# Patient Record
Sex: Male | Born: 1950 | Race: Black or African American | Hispanic: No | Marital: Married | Smoking: Former smoker
Health system: Southern US, Community
[De-identification: ages and names within clinical notes are randomized; demographics above are authoritative.]

## PROBLEM LIST (undated history)

## (undated) DIAGNOSIS — I10 Essential (primary) hypertension: Secondary | ICD-10-CM

## (undated) DIAGNOSIS — E785 Hyperlipidemia, unspecified: Secondary | ICD-10-CM

## (undated) DIAGNOSIS — G20 Parkinson's disease: Secondary | ICD-10-CM

## (undated) DIAGNOSIS — G20A1 Parkinson's disease without dyskinesia, without mention of fluctuations: Secondary | ICD-10-CM

## (undated) DIAGNOSIS — S301XXA Contusion of abdominal wall, initial encounter: Secondary | ICD-10-CM

## (undated) DIAGNOSIS — H269 Unspecified cataract: Secondary | ICD-10-CM

## (undated) DIAGNOSIS — IMO0002 Reserved for concepts with insufficient information to code with codable children: Secondary | ICD-10-CM

## (undated) DIAGNOSIS — M1990 Unspecified osteoarthritis, unspecified site: Secondary | ICD-10-CM

## (undated) DIAGNOSIS — C61 Malignant neoplasm of prostate: Secondary | ICD-10-CM

## (undated) HISTORY — DX: Essential (primary) hypertension: I10

## (undated) HISTORY — PX: POLYPECTOMY: SHX149

## (undated) HISTORY — DX: Malignant neoplasm of prostate: C61

## (undated) HISTORY — DX: Parkinson's disease: G20

## (undated) HISTORY — DX: Hyperlipidemia, unspecified: E78.5

## (undated) HISTORY — DX: Unspecified cataract: H26.9

## (undated) HISTORY — DX: Unspecified osteoarthritis, unspecified site: M19.90

## (undated) HISTORY — DX: Contusion of abdominal wall, initial encounter: S30.1XXA

## (undated) HISTORY — DX: Reserved for concepts with insufficient information to code with codable children: IMO0002

## (undated) HISTORY — PX: COLONOSCOPY: SHX174

## (undated) HISTORY — DX: Parkinson's disease without dyskinesia, without mention of fluctuations: G20.A1

---

## 1970-07-06 DIAGNOSIS — IMO0002 Reserved for concepts with insufficient information to code with codable children: Secondary | ICD-10-CM

## 1970-07-06 HISTORY — DX: Reserved for concepts with insufficient information to code with codable children: IMO0002

## 1972-07-06 HISTORY — PX: FACIAL RECONSTRUCTION SURGERY: SHX631

## 1997-10-04 ENCOUNTER — Encounter: Admission: RE | Admit: 1997-10-04 | Discharge: 1997-10-04 | Payer: Self-pay | Admitting: Family Medicine

## 1997-10-12 ENCOUNTER — Encounter: Admission: RE | Admit: 1997-10-12 | Discharge: 1997-10-12 | Payer: Self-pay | Admitting: Family Medicine

## 1998-04-18 ENCOUNTER — Encounter: Admission: RE | Admit: 1998-04-18 | Discharge: 1998-04-18 | Payer: Self-pay | Admitting: Family Medicine

## 1998-06-06 ENCOUNTER — Encounter: Admission: RE | Admit: 1998-06-06 | Discharge: 1998-06-06 | Payer: Self-pay | Admitting: Family Medicine

## 1998-07-16 ENCOUNTER — Encounter: Admission: RE | Admit: 1998-07-16 | Discharge: 1998-07-16 | Payer: Self-pay | Admitting: Family Medicine

## 1998-07-19 ENCOUNTER — Encounter: Admission: RE | Admit: 1998-07-19 | Discharge: 1998-10-17 | Payer: Self-pay | Admitting: *Deleted

## 1998-08-05 ENCOUNTER — Ambulatory Visit (HOSPITAL_COMMUNITY): Admission: RE | Admit: 1998-08-05 | Discharge: 1998-08-05 | Payer: Self-pay | Admitting: *Deleted

## 1998-08-16 ENCOUNTER — Encounter: Admission: RE | Admit: 1998-08-16 | Discharge: 1998-08-16 | Payer: Self-pay | Admitting: Family Medicine

## 1998-10-14 ENCOUNTER — Encounter: Admission: RE | Admit: 1998-10-14 | Discharge: 1998-10-14 | Payer: Self-pay | Admitting: Family Medicine

## 1998-11-07 ENCOUNTER — Encounter: Admission: RE | Admit: 1998-11-07 | Discharge: 1998-11-07 | Payer: Self-pay | Admitting: Family Medicine

## 1998-12-09 ENCOUNTER — Encounter: Admission: RE | Admit: 1998-12-09 | Discharge: 1998-12-09 | Payer: Self-pay | Admitting: Family Medicine

## 1999-02-17 ENCOUNTER — Encounter: Admission: RE | Admit: 1999-02-17 | Discharge: 1999-02-17 | Payer: Self-pay | Admitting: Family Medicine

## 1999-03-03 ENCOUNTER — Encounter: Admission: RE | Admit: 1999-03-03 | Discharge: 1999-03-03 | Payer: Self-pay | Admitting: Family Medicine

## 1999-03-17 ENCOUNTER — Encounter: Admission: RE | Admit: 1999-03-17 | Discharge: 1999-03-17 | Payer: Self-pay | Admitting: Family Medicine

## 1999-05-07 ENCOUNTER — Encounter: Admission: RE | Admit: 1999-05-07 | Discharge: 1999-05-07 | Payer: Self-pay | Admitting: Family Medicine

## 1999-06-18 ENCOUNTER — Encounter: Admission: RE | Admit: 1999-06-18 | Discharge: 1999-06-18 | Payer: Self-pay | Admitting: Family Medicine

## 1999-06-19 ENCOUNTER — Encounter: Admission: RE | Admit: 1999-06-19 | Discharge: 1999-06-19 | Payer: Self-pay | Admitting: Family Medicine

## 1999-10-16 ENCOUNTER — Encounter: Payer: Self-pay | Admitting: Sports Medicine

## 1999-10-16 ENCOUNTER — Encounter: Admission: RE | Admit: 1999-10-16 | Discharge: 1999-10-16 | Payer: Self-pay | Admitting: Family Medicine

## 1999-10-16 ENCOUNTER — Encounter: Admission: RE | Admit: 1999-10-16 | Discharge: 1999-10-16 | Payer: Self-pay | Admitting: Sports Medicine

## 1999-11-28 ENCOUNTER — Encounter: Admission: RE | Admit: 1999-11-28 | Discharge: 1999-11-28 | Payer: Self-pay | Admitting: Family Medicine

## 1999-12-24 ENCOUNTER — Encounter: Admission: RE | Admit: 1999-12-24 | Discharge: 1999-12-24 | Payer: Self-pay | Admitting: Family Medicine

## 2000-04-12 ENCOUNTER — Encounter: Admission: RE | Admit: 2000-04-12 | Discharge: 2000-04-12 | Payer: Self-pay | Admitting: Family Medicine

## 2000-04-14 ENCOUNTER — Encounter: Payer: Self-pay | Admitting: Emergency Medicine

## 2000-04-14 ENCOUNTER — Encounter: Admission: RE | Admit: 2000-04-14 | Discharge: 2000-04-14 | Payer: Self-pay | Admitting: Family Medicine

## 2000-04-14 ENCOUNTER — Emergency Department (HOSPITAL_COMMUNITY): Admission: EM | Admit: 2000-04-14 | Discharge: 2000-04-14 | Payer: Self-pay | Admitting: Emergency Medicine

## 2000-05-14 ENCOUNTER — Encounter: Admission: RE | Admit: 2000-05-14 | Discharge: 2000-05-14 | Payer: Self-pay | Admitting: Family Medicine

## 2000-05-25 ENCOUNTER — Encounter: Admission: RE | Admit: 2000-05-25 | Discharge: 2000-05-25 | Payer: Self-pay | Admitting: Sports Medicine

## 2000-06-08 ENCOUNTER — Encounter: Admission: RE | Admit: 2000-06-08 | Discharge: 2000-06-08 | Payer: Self-pay | Admitting: Family Medicine

## 2000-12-03 ENCOUNTER — Encounter: Admission: RE | Admit: 2000-12-03 | Discharge: 2000-12-03 | Payer: Self-pay | Admitting: Family Medicine

## 2001-01-26 ENCOUNTER — Encounter: Admission: RE | Admit: 2001-01-26 | Discharge: 2001-01-26 | Payer: Self-pay | Admitting: Family Medicine

## 2001-07-26 ENCOUNTER — Encounter: Admission: RE | Admit: 2001-07-26 | Discharge: 2001-07-26 | Payer: Self-pay | Admitting: Family Medicine

## 2001-12-07 ENCOUNTER — Encounter: Admission: RE | Admit: 2001-12-07 | Discharge: 2001-12-07 | Payer: Self-pay | Admitting: Family Medicine

## 2001-12-13 ENCOUNTER — Ambulatory Visit (HOSPITAL_COMMUNITY): Admission: RE | Admit: 2001-12-13 | Discharge: 2001-12-13 | Payer: Self-pay | Admitting: Infectious Diseases

## 2001-12-13 ENCOUNTER — Encounter: Payer: Self-pay | Admitting: Infectious Diseases

## 2001-12-13 ENCOUNTER — Encounter: Admission: RE | Admit: 2001-12-13 | Discharge: 2001-12-13 | Payer: Self-pay | Admitting: Sports Medicine

## 2001-12-15 ENCOUNTER — Encounter: Admission: RE | Admit: 2001-12-15 | Discharge: 2001-12-15 | Payer: Self-pay | Admitting: Family Medicine

## 2002-01-12 ENCOUNTER — Emergency Department (HOSPITAL_COMMUNITY): Admission: EM | Admit: 2002-01-12 | Discharge: 2002-01-12 | Payer: Self-pay

## 2002-03-17 ENCOUNTER — Encounter: Payer: Self-pay | Admitting: Sports Medicine

## 2002-03-17 ENCOUNTER — Encounter: Admission: RE | Admit: 2002-03-17 | Discharge: 2002-03-17 | Payer: Self-pay | Admitting: Family Medicine

## 2002-03-17 ENCOUNTER — Encounter: Admission: RE | Admit: 2002-03-17 | Discharge: 2002-03-17 | Payer: Self-pay | Admitting: *Deleted

## 2002-03-27 ENCOUNTER — Encounter: Admission: RE | Admit: 2002-03-27 | Discharge: 2002-03-27 | Payer: Self-pay | Admitting: Family Medicine

## 2002-07-26 ENCOUNTER — Encounter: Admission: RE | Admit: 2002-07-26 | Discharge: 2002-07-26 | Payer: Self-pay | Admitting: Family Medicine

## 2002-08-30 ENCOUNTER — Encounter: Admission: RE | Admit: 2002-08-30 | Discharge: 2002-08-30 | Payer: Self-pay | Admitting: Family Medicine

## 2002-09-15 ENCOUNTER — Encounter: Admission: RE | Admit: 2002-09-15 | Discharge: 2002-09-15 | Payer: Self-pay | Admitting: Family Medicine

## 2002-11-04 ENCOUNTER — Emergency Department (HOSPITAL_COMMUNITY): Admission: EM | Admit: 2002-11-04 | Discharge: 2002-11-04 | Payer: Self-pay | Admitting: Emergency Medicine

## 2002-11-15 ENCOUNTER — Encounter: Admission: RE | Admit: 2002-11-15 | Discharge: 2002-11-15 | Payer: Self-pay | Admitting: Family Medicine

## 2002-11-24 ENCOUNTER — Encounter: Admission: RE | Admit: 2002-11-24 | Discharge: 2002-11-24 | Payer: Self-pay | Admitting: Family Medicine

## 2002-11-29 ENCOUNTER — Encounter: Admission: RE | Admit: 2002-11-29 | Discharge: 2002-11-29 | Payer: Self-pay | Admitting: Sports Medicine

## 2002-11-29 ENCOUNTER — Encounter: Payer: Self-pay | Admitting: Sports Medicine

## 2002-12-01 ENCOUNTER — Encounter: Admission: RE | Admit: 2002-12-01 | Discharge: 2002-12-01 | Payer: Self-pay | Admitting: Family Medicine

## 2002-12-27 ENCOUNTER — Encounter: Admission: RE | Admit: 2002-12-27 | Discharge: 2002-12-27 | Payer: Self-pay | Admitting: Family Medicine

## 2003-01-12 ENCOUNTER — Encounter: Admission: RE | Admit: 2003-01-12 | Discharge: 2003-01-12 | Payer: Self-pay | Admitting: Family Medicine

## 2003-01-22 ENCOUNTER — Encounter: Admission: RE | Admit: 2003-01-22 | Discharge: 2003-01-22 | Payer: Self-pay | Admitting: Family Medicine

## 2003-04-16 ENCOUNTER — Encounter: Admission: RE | Admit: 2003-04-16 | Discharge: 2003-04-16 | Payer: Self-pay | Admitting: Family Medicine

## 2003-06-13 ENCOUNTER — Emergency Department (HOSPITAL_COMMUNITY): Admission: AD | Admit: 2003-06-13 | Discharge: 2003-06-13 | Payer: Self-pay | Admitting: Family Medicine

## 2003-07-07 DIAGNOSIS — C61 Malignant neoplasm of prostate: Secondary | ICD-10-CM

## 2003-07-07 HISTORY — DX: Malignant neoplasm of prostate: C61

## 2003-07-07 HISTORY — PX: PROSTATE SURGERY: SHX751

## 2003-07-31 ENCOUNTER — Encounter: Admission: RE | Admit: 2003-07-31 | Discharge: 2003-07-31 | Payer: Self-pay | Admitting: Sports Medicine

## 2003-08-14 ENCOUNTER — Encounter: Admission: RE | Admit: 2003-08-14 | Discharge: 2003-08-14 | Payer: Self-pay | Admitting: Family Medicine

## 2003-11-13 ENCOUNTER — Encounter: Admission: RE | Admit: 2003-11-13 | Discharge: 2003-11-13 | Payer: Self-pay | Admitting: Urology

## 2003-11-30 ENCOUNTER — Encounter: Admission: RE | Admit: 2003-11-30 | Discharge: 2003-11-30 | Payer: Self-pay | Admitting: Family Medicine

## 2003-11-30 ENCOUNTER — Ambulatory Visit (HOSPITAL_COMMUNITY): Admission: RE | Admit: 2003-11-30 | Discharge: 2003-11-30 | Payer: Self-pay | Admitting: Family Medicine

## 2003-12-31 ENCOUNTER — Inpatient Hospital Stay (HOSPITAL_COMMUNITY): Admission: RE | Admit: 2003-12-31 | Discharge: 2004-01-02 | Payer: Self-pay | Admitting: Urology

## 2003-12-31 ENCOUNTER — Encounter (INDEPENDENT_AMBULATORY_CARE_PROVIDER_SITE_OTHER): Payer: Self-pay | Admitting: Specialist

## 2004-01-22 ENCOUNTER — Ambulatory Visit: Admission: RE | Admit: 2004-01-22 | Discharge: 2004-02-08 | Payer: Self-pay | Admitting: Radiation Oncology

## 2004-03-12 ENCOUNTER — Ambulatory Visit: Admission: RE | Admit: 2004-03-12 | Discharge: 2004-06-06 | Payer: Self-pay | Admitting: Radiation Oncology

## 2004-04-04 ENCOUNTER — Ambulatory Visit: Payer: Self-pay | Admitting: Family Medicine

## 2004-04-24 ENCOUNTER — Ambulatory Visit: Payer: Self-pay | Admitting: Family Medicine

## 2004-05-16 ENCOUNTER — Emergency Department (HOSPITAL_COMMUNITY): Admission: EM | Admit: 2004-05-16 | Discharge: 2004-05-16 | Payer: Self-pay | Admitting: Emergency Medicine

## 2004-06-26 ENCOUNTER — Ambulatory Visit: Admission: RE | Admit: 2004-06-26 | Discharge: 2004-06-26 | Payer: Self-pay | Admitting: Radiation Oncology

## 2004-07-10 ENCOUNTER — Ambulatory Visit: Admission: RE | Admit: 2004-07-10 | Discharge: 2004-07-10 | Payer: Self-pay | Admitting: Radiation Oncology

## 2004-07-21 ENCOUNTER — Ambulatory Visit: Payer: Self-pay | Admitting: Sports Medicine

## 2004-10-27 ENCOUNTER — Ambulatory Visit: Payer: Self-pay | Admitting: Family Medicine

## 2005-03-02 ENCOUNTER — Ambulatory Visit: Payer: Self-pay | Admitting: Family Medicine

## 2005-03-03 ENCOUNTER — Ambulatory Visit: Payer: Self-pay | Admitting: Sports Medicine

## 2005-04-03 ENCOUNTER — Ambulatory Visit (HOSPITAL_COMMUNITY): Admission: RE | Admit: 2005-04-03 | Discharge: 2005-04-03 | Payer: Self-pay | Admitting: Sports Medicine

## 2005-04-03 ENCOUNTER — Ambulatory Visit: Payer: Self-pay | Admitting: Family Medicine

## 2005-07-17 ENCOUNTER — Ambulatory Visit: Payer: Self-pay | Admitting: Sports Medicine

## 2005-07-24 ENCOUNTER — Ambulatory Visit: Payer: Self-pay | Admitting: Family Medicine

## 2005-10-22 ENCOUNTER — Ambulatory Visit: Payer: Self-pay | Admitting: Family Medicine

## 2006-04-26 ENCOUNTER — Ambulatory Visit (HOSPITAL_COMMUNITY): Admission: RE | Admit: 2006-04-26 | Discharge: 2006-04-26 | Payer: Self-pay | Admitting: Family Medicine

## 2006-04-26 ENCOUNTER — Ambulatory Visit: Payer: Self-pay | Admitting: Sports Medicine

## 2006-05-04 ENCOUNTER — Ambulatory Visit: Payer: Self-pay | Admitting: Cardiology

## 2006-05-07 ENCOUNTER — Inpatient Hospital Stay (HOSPITAL_BASED_OUTPATIENT_CLINIC_OR_DEPARTMENT_OTHER): Admission: RE | Admit: 2006-05-07 | Discharge: 2006-05-07 | Payer: Self-pay | Admitting: Cardiovascular Disease

## 2006-05-07 ENCOUNTER — Ambulatory Visit: Payer: Self-pay | Admitting: Cardiovascular Disease

## 2006-05-20 ENCOUNTER — Emergency Department (HOSPITAL_COMMUNITY): Admission: EM | Admit: 2006-05-20 | Discharge: 2006-05-20 | Payer: Self-pay | Admitting: Emergency Medicine

## 2006-06-03 ENCOUNTER — Ambulatory Visit: Payer: Self-pay | Admitting: Family Medicine

## 2006-06-17 ENCOUNTER — Ambulatory Visit: Payer: Self-pay | Admitting: Cardiology

## 2006-07-17 ENCOUNTER — Emergency Department (HOSPITAL_COMMUNITY): Admission: EM | Admit: 2006-07-17 | Discharge: 2006-07-17 | Payer: Self-pay | Admitting: Family Medicine

## 2006-09-02 DIAGNOSIS — R809 Proteinuria, unspecified: Secondary | ICD-10-CM | POA: Insufficient documentation

## 2006-09-02 DIAGNOSIS — E1159 Type 2 diabetes mellitus with other circulatory complications: Secondary | ICD-10-CM | POA: Insufficient documentation

## 2006-09-02 DIAGNOSIS — M069 Rheumatoid arthritis, unspecified: Secondary | ICD-10-CM

## 2006-09-02 DIAGNOSIS — I10 Essential (primary) hypertension: Secondary | ICD-10-CM | POA: Insufficient documentation

## 2006-09-02 DIAGNOSIS — B351 Tinea unguium: Secondary | ICD-10-CM

## 2006-09-02 DIAGNOSIS — K219 Gastro-esophageal reflux disease without esophagitis: Secondary | ICD-10-CM

## 2006-09-02 DIAGNOSIS — I152 Hypertension secondary to endocrine disorders: Secondary | ICD-10-CM | POA: Insufficient documentation

## 2006-09-08 ENCOUNTER — Ambulatory Visit: Payer: Self-pay | Admitting: Family Medicine

## 2006-09-08 ENCOUNTER — Encounter: Payer: Self-pay | Admitting: Family Medicine

## 2006-09-08 DIAGNOSIS — E119 Type 2 diabetes mellitus without complications: Secondary | ICD-10-CM | POA: Insufficient documentation

## 2006-09-08 DIAGNOSIS — Z8546 Personal history of malignant neoplasm of prostate: Secondary | ICD-10-CM

## 2006-09-08 LAB — CONVERTED CEMR LAB
ALT: 22 units/L (ref 0–53)
Albumin: 3.9 g/dL (ref 3.5–5.2)
Alkaline Phosphatase: 69 units/L (ref 39–117)
BUN: 15 mg/dL (ref 6–23)
CO2: 28 meq/L (ref 19–32)
Glucose, Bld: 126 mg/dL — ABNORMAL HIGH (ref 70–99)
HDL: 39 mg/dL — ABNORMAL LOW (ref >39)
Hgb A1c MFr Bld: 6.6 %
LDL Cholesterol: 64 mg/dL (ref 0–99)
Sodium: 140 meq/L (ref 135–145)
Triglycerides: 358 mg/dL — ABNORMAL HIGH (ref <150)
VLDL: 72 mg/dL — ABNORMAL HIGH (ref 0–40)

## 2006-09-09 ENCOUNTER — Encounter: Payer: Self-pay | Admitting: Family Medicine

## 2006-09-10 ENCOUNTER — Telehealth: Payer: Self-pay | Admitting: Family Medicine

## 2006-09-13 ENCOUNTER — Encounter: Payer: Self-pay | Admitting: Family Medicine

## 2006-09-13 LAB — CONVERTED CEMR LAB: PSA: ABNORMAL ng/mL

## 2006-09-17 ENCOUNTER — Telehealth: Payer: Self-pay | Admitting: Family Medicine

## 2006-09-20 ENCOUNTER — Telehealth: Payer: Self-pay | Admitting: *Deleted

## 2006-09-20 ENCOUNTER — Telehealth (INDEPENDENT_AMBULATORY_CARE_PROVIDER_SITE_OTHER): Payer: Self-pay | Admitting: *Deleted

## 2006-10-14 ENCOUNTER — Encounter: Payer: Self-pay | Admitting: Family Medicine

## 2007-03-04 ENCOUNTER — Telehealth (INDEPENDENT_AMBULATORY_CARE_PROVIDER_SITE_OTHER): Payer: Self-pay | Admitting: Family Medicine

## 2007-03-05 ENCOUNTER — Emergency Department (HOSPITAL_COMMUNITY): Admission: EM | Admit: 2007-03-05 | Discharge: 2007-03-05 | Payer: Self-pay | Admitting: Emergency Medicine

## 2007-03-08 ENCOUNTER — Telehealth (INDEPENDENT_AMBULATORY_CARE_PROVIDER_SITE_OTHER): Payer: Self-pay | Admitting: Family Medicine

## 2007-03-11 ENCOUNTER — Telehealth (INDEPENDENT_AMBULATORY_CARE_PROVIDER_SITE_OTHER): Payer: Self-pay | Admitting: Family Medicine

## 2007-04-20 ENCOUNTER — Ambulatory Visit: Payer: Self-pay | Admitting: Family Medicine

## 2007-04-20 DIAGNOSIS — M109 Gout, unspecified: Secondary | ICD-10-CM

## 2007-04-20 DIAGNOSIS — R209 Unspecified disturbances of skin sensation: Secondary | ICD-10-CM

## 2007-04-20 LAB — CONVERTED CEMR LAB: Hgb A1c MFr Bld: 6.6 %

## 2007-05-25 ENCOUNTER — Encounter (INDEPENDENT_AMBULATORY_CARE_PROVIDER_SITE_OTHER): Payer: Self-pay | Admitting: *Deleted

## 2007-09-23 ENCOUNTER — Encounter: Payer: Self-pay | Admitting: *Deleted

## 2007-10-07 ENCOUNTER — Ambulatory Visit: Payer: Self-pay | Admitting: Family Medicine

## 2007-11-04 ENCOUNTER — Encounter (INDEPENDENT_AMBULATORY_CARE_PROVIDER_SITE_OTHER): Payer: Self-pay | Admitting: Family Medicine

## 2007-11-07 ENCOUNTER — Encounter (INDEPENDENT_AMBULATORY_CARE_PROVIDER_SITE_OTHER): Payer: Self-pay | Admitting: Family Medicine

## 2007-11-10 ENCOUNTER — Encounter (INDEPENDENT_AMBULATORY_CARE_PROVIDER_SITE_OTHER): Payer: Self-pay | Admitting: Family Medicine

## 2008-03-06 LAB — CONVERTED CEMR LAB

## 2008-03-14 ENCOUNTER — Ambulatory Visit: Payer: Self-pay | Admitting: Family Medicine

## 2008-03-21 ENCOUNTER — Encounter (INDEPENDENT_AMBULATORY_CARE_PROVIDER_SITE_OTHER): Payer: Self-pay | Admitting: Family Medicine

## 2008-03-23 ENCOUNTER — Encounter (INDEPENDENT_AMBULATORY_CARE_PROVIDER_SITE_OTHER): Payer: Self-pay | Admitting: Family Medicine

## 2008-03-23 ENCOUNTER — Ambulatory Visit: Payer: Self-pay | Admitting: Family Medicine

## 2008-03-26 DIAGNOSIS — E785 Hyperlipidemia, unspecified: Secondary | ICD-10-CM

## 2008-03-26 LAB — CONVERTED CEMR LAB
ALT: 16 units/L (ref 0–53)
Chloride: 100 meq/L (ref 96–112)
HDL: 38 mg/dL — ABNORMAL LOW (ref >39)
Potassium: 4.5 meq/L (ref 3.5–5.3)
Total Bilirubin: 0.4 mg/dL (ref 0.3–1.2)
Triglycerides: 468 mg/dL — ABNORMAL HIGH (ref <150)

## 2008-04-11 ENCOUNTER — Encounter: Payer: Self-pay | Admitting: *Deleted

## 2008-06-22 ENCOUNTER — Encounter (INDEPENDENT_AMBULATORY_CARE_PROVIDER_SITE_OTHER): Payer: Self-pay | Admitting: Family Medicine

## 2008-09-07 ENCOUNTER — Ambulatory Visit: Payer: Self-pay | Admitting: Family Medicine

## 2008-09-07 ENCOUNTER — Encounter (INDEPENDENT_AMBULATORY_CARE_PROVIDER_SITE_OTHER): Payer: Self-pay | Admitting: Family Medicine

## 2008-09-07 ENCOUNTER — Telehealth (INDEPENDENT_AMBULATORY_CARE_PROVIDER_SITE_OTHER): Payer: Self-pay | Admitting: *Deleted

## 2008-09-07 DIAGNOSIS — E669 Obesity, unspecified: Secondary | ICD-10-CM

## 2008-09-07 LAB — CONVERTED CEMR LAB
ALT: 9 units/L (ref 0–53)
CO2: 28 meq/L (ref 19–32)
Cholesterol: 148 mg/dL (ref 0–200)
Creatinine, Ser: 0.87 mg/dL (ref 0.40–1.50)
Glucose, Bld: 140 mg/dL — ABNORMAL HIGH (ref 70–99)
HDL: 36 mg/dL — ABNORMAL LOW (ref >39)
LDL Cholesterol: 77 mg/dL (ref 0–99)
Potassium: 4.2 meq/L (ref 3.5–5.3)
Sodium: 141 meq/L (ref 135–145)
Total Bilirubin: 0.5 mg/dL (ref 0.3–1.2)
VLDL: 35 mg/dL (ref 0–40)

## 2008-09-09 ENCOUNTER — Encounter (INDEPENDENT_AMBULATORY_CARE_PROVIDER_SITE_OTHER): Payer: Self-pay | Admitting: Family Medicine

## 2008-10-12 ENCOUNTER — Telehealth (INDEPENDENT_AMBULATORY_CARE_PROVIDER_SITE_OTHER): Payer: Self-pay | Admitting: *Deleted

## 2009-01-04 ENCOUNTER — Telehealth (INDEPENDENT_AMBULATORY_CARE_PROVIDER_SITE_OTHER): Payer: Self-pay | Admitting: Family Medicine

## 2009-01-13 LAB — CONVERTED CEMR LAB: PSA: NORMAL ng/mL

## 2009-01-21 ENCOUNTER — Encounter: Payer: Self-pay | Admitting: Family Medicine

## 2009-01-23 ENCOUNTER — Ambulatory Visit: Payer: Self-pay | Admitting: Family Medicine

## 2009-01-23 LAB — CONVERTED CEMR LAB: Hgb A1c MFr Bld: 6.8 %

## 2009-07-09 ENCOUNTER — Ambulatory Visit: Payer: Self-pay | Admitting: Family Medicine

## 2009-07-09 DIAGNOSIS — M545 Low back pain: Secondary | ICD-10-CM

## 2009-09-27 ENCOUNTER — Ambulatory Visit: Payer: Self-pay | Admitting: Family Medicine

## 2009-09-27 ENCOUNTER — Encounter: Payer: Self-pay | Admitting: Family Medicine

## 2009-09-27 LAB — CONVERTED CEMR LAB
ALT: 8 units/L (ref 0–53)
Alkaline Phosphatase: 41 units/L (ref 39–117)
Chloride: 103 meq/L (ref 96–112)
Creatinine, Ser: 0.99 mg/dL (ref 0.40–1.50)
Indirect Bilirubin: 0.4 mg/dL (ref 0.0–0.9)
Sodium: 141 meq/L (ref 135–145)
Total CHOL/HDL Ratio: 4.1
Total Protein: 6.8 g/dL (ref 6.0–8.3)

## 2009-09-30 ENCOUNTER — Encounter: Payer: Self-pay | Admitting: Family Medicine

## 2009-11-11 ENCOUNTER — Emergency Department (HOSPITAL_COMMUNITY): Admission: EM | Admit: 2009-11-11 | Discharge: 2009-11-11 | Payer: Self-pay | Admitting: Emergency Medicine

## 2010-01-28 ENCOUNTER — Ambulatory Visit: Payer: Self-pay | Admitting: Family Medicine

## 2010-01-28 DIAGNOSIS — M79609 Pain in unspecified limb: Secondary | ICD-10-CM

## 2010-01-28 LAB — CONVERTED CEMR LAB: Hgb A1c MFr Bld: 7.4 %

## 2010-02-17 ENCOUNTER — Ambulatory Visit: Payer: Self-pay | Admitting: Family Medicine

## 2010-02-17 DIAGNOSIS — R059 Cough, unspecified: Secondary | ICD-10-CM | POA: Insufficient documentation

## 2010-02-17 DIAGNOSIS — R05 Cough: Secondary | ICD-10-CM

## 2010-04-30 ENCOUNTER — Encounter: Payer: Self-pay | Admitting: Pharmacist

## 2010-07-21 ENCOUNTER — Encounter: Admission: RE | Admit: 2010-07-21 | Payer: Self-pay | Source: Home / Self Care | Admitting: Family Medicine

## 2010-08-07 NOTE — Assessment & Plan Note (Signed)
Summary: Back pain, DM2, HTN   Vital Signs:  Patient profile:   59 year old male Height:      75 inches Weight:      246 pounds BMI:     30.86 BSA:     2.40 Temp:     98.2 degrees F Pulse rate:   87 / minute BP sitting:   125 / 80  Vitals Entered By: Jone Baseman CMA (July 09, 2009 4:51 PM) CC: Back pain, DM, HTN Is Patient Diabetic? Yes Did you bring your meter with you today? No Pain Assessment Patient in pain? no        Primary Care Provider:  Jamie Brookes MD  CC:  Back pain, DM, and HTN.  History of Present Illness: Back pain: Pt has had back pain for about 2 years. He says that he has mentioned it several times but has never had a work up. He would like to get it looked into. DIscussed exercises. Currently not exercising.    DM2: Pt is considering taking a Diabetes management class at [facility]. He is hopeing to start an exercise regimne there. He is not checking his CBG's because it is too expensive for him and his wife to buy the test strips and so he lets her use them all and he doesn't test. He only tests his BS if he feels poorly. When he does test it is usually 128-132.   hypertension: Pt has not been checking his BP at home. His machine at home is broken but he does go to the Employee health clinic at [facility] and get it checked about 1x a month. He says it is usually in the normal range.     Habits & Providers  Alcohol-Tobacco-Diet     Tobacco Status: never  Allergies: No Known Drug Allergies  Review of Systems        vitals reviewed and pertinent negatives and positives seen in HPI   Physical Exam  General:  Overweight,in no acute distress; alert,appropriate and cooperative throughout examination Lungs:  Normal respiratory effort, chest expands symmetrically. Lungs are clear to auscultation, no crackles or wheezes. Heart:  Normal rate and regular rhythm. S1 and S2 normal without gallop, murmur, click, rub or other extra  sounds. Psych:  Cognition and judgment appear intact. Alert and cooperative with normal attention span and concentration. No apparent delusions, illusions, hallucinations    Impression & Recommendations:  Problem # 1:  LOW BACK PAIN, CHRONIC (ICD-724.2) Assessment New Pt has had pain for 2 years but it has never been looked into and no work up has been done. Discussed possiblity of muscle strain, osteoporosis or degerative disk disease. will await x-ray and treat while waiting.   His updated medication list for this problem includes:    Bayer Childrens Aspirin 81 Mg Chew (Aspirin) .Marland Kitchen... Take 1 tablet by mouth once a day  Orders: Diagnostic X-Ray/Fluoroscopy (Diagnostic X-Ray/Flu) [facility]- Est  Level 4 (14782)  Problem # 2:  DIABETES MELLITUS, TYPE II (ICD-250.00) Assessment: Unchanged Pt has gone form an A1c of 6.8 to 6.9. WE will not make any change to his regimine. He has seen an eye MD in the last year. he regularily checks his feet. Gave pt record logbook.   His updated medication list for this problem includes:    Bayer Childrens Aspirin 81 Mg Chew (Aspirin) .Marland Kitchen... Take 1 tablet by mouth once a day    Glucophage 1000 Mg Tabs (Metformin hcl) .Marland Kitchen... Take 1  tablet by mouth twice a day    Lisinopril-hydrochlorothiazide 20-25 Mg Tabs (Lisinopril-hydrochlorothiazide) .Marland Kitchen... 1 tab by mouth daily  Orders: A1C-[facility] (78295) [facility]- Est  Level 4 (62130)  Problem # 3:  HYPERTENSION, BENIGN SYSTEMIC (ICD-401.1) Assessment: Unchanged hypertension is well controlled. Encouraged pt to keep log of this as well. Cont home meds.   His updated medication list for this problem includes:    Lisinopril-hydrochlorothiazide 20-25 Mg Tabs (Lisinopril-hydrochlorothiazide) .Marland Kitchen... 1 tab by mouth daily  Orders: T-Basic Metabolic Panel [id number]) [facility]- Est  Level 4 (95284)  Complete Medication List: 1)  Bayer Childrens Aspirin 81 Mg Chew (Aspirin) .... Take 1 tablet by mouth once a day 2)  Glucophage 1000 Mg  Tabs (Metformin hcl) .... Take 1 tablet by mouth twice a day 3)  Lisinopril-hydrochlorothiazide 20-25 Mg Tabs (Lisinopril-hydrochlorothiazide) .Marland Kitchen.. 1 tab by mouth daily 4)  Zocor 40 Mg Tabs (Simvastatin) .... Take 1 tablet by mouth at bedtime 5)  Methotrexate 2.5 Mg Tabs (Methotrexate sodium) .Marland Kitchen.. 1 tab by mouth daily 6)  Viagra  .Marland Kitchen.. 1 tab by mouth prior to intercourse 7)  Accu-chek Aviva Strp (Glucose blood) .... Use as directed to check sugars 1-2 times daily 8)  Folic Acid 400 Mcg Tabs (Folic acid) .... Take one daily  Other Orders: T-Lipid Profile [id number]) T-Hepatic Function [id number])  Patient Instructions: 1)  It was nice to see you today. 2)  Go to [place] [facility] to get your lower back x-rays.  3)  Use a heating bad and Tylenol or Motrin for your low back pain when it is flaired up.  4)  Make a lab appointment to have your fasting labs blood drawn before you see me in March.  5)  Go to the Diabetes class.  6)  Exercise 30 min per day.  Prescriptions: FOLIC ACID 400 MCG TABS (FOLIC ACID) take one daily  #30 x 11   Entered and Authorized by:   Eric Brookes MD   Signed by:   Eric Brookes MD on 07/09/2009   Method used:   Historical   RxID:   [id number] LISINOPRIL-HYDROCHLOROTHIAZIDE 20-25 MG  TABS (LISINOPRIL-HYDROCHLOROTHIAZIDE) 1 tab by mouth daily  #90 x 3   Entered and Authorized by:   Eric Brookes MD   Signed by:   Eric Brookes MD on 07/09/2009   Method used:   Electronically to        [facility]* (retail)       [address]       [address] Shipping/mailing       [place], [place]  [address]       Ph: [phone]       Fax: [phone]   RxID:   [id number] ZOCOR 40 MG TABS (SIMVASTATIN) Take 1 tablet by mouth at bedtime  #90 x 3   Entered and Authorized by:   Eric Brookes MD   Signed by:   Eric Brookes MD on 07/09/2009   Method used:   Electronically to        [facility]* (retail)        [address]       [address] Shipping/mailing       [place], [place]  [address]       Ph: [phone]       Fax: [phone]   RxID:   [id number]    Prevention & Chronic Care Immunizations   Influenza vaccine: Not documented    Tetanus booster:  Not documented   Td booster deferral: Not indicated  (07/09/2009)   Tetanus booster due: 07/06/2016    Pneumococcal vaccine: Done.  (07/06/2001)   Pneumococcal vaccine due: None  Colorectal Screening   Hemoccult: Done.  (02/08/2005)   Hemoccult due: Not Indicated    Colonoscopy: Done.  (01/08/2005)   Colonoscopy due: 01/09/2015  Other Screening   PSA: normal per patient  (01/13/2009)   PSA due due: 01/13/2010   Smoking status: never  (07/09/2009)  Diabetes Mellitus   HgbA1C: 6.9  (07/09/2009)   Hemoglobin A1C due: 01/06/2008    Eye exam: normal  (11/04/2007)   Diabetic eye exam action/deferral: Not indicated  (07/09/2009)   Eye exam due: 11/03/2008    Foot exam: normal  (01/23/2009)   High risk foot: Not documented   Foot care education: Not documented   Foot exam due: 07/26/2009    Urine microalbumin/creatinine ratio: Not documented   Urine microalbumin/cr due: 09/08/2007    Diabetes flowsheet reviewed?: Yes   Progress toward A1C goal: At goal  Lipids   Total Cholesterol: 148  (09/07/2008)   Lipid panel action/deferral: Lipid Panel ordered   LDL: 77  (09/07/2008)   LDL Direct: Not documented   HDL: 36  (09/07/2008)   Triglycerides: 173  (09/07/2008)    SGOT (AST): 12  (09/07/2008)   BMP action: Ordered   SGPT (ALT): 9  (09/07/2008)   Alkaline phosphatase: 48  (09/07/2008)   Total bilirubin: 0.5  (09/07/2008)    Lipid flowsheet reviewed?: Yes   Progress toward LDL goal: Improved  Hypertension   Last Blood Pressure: 125 / 80  (07/09/2009)   Serum creatinine: 0.87  (09/07/2008)   BMP action: Ordered   Serum potassium 4.2  (09/07/2008)    Hypertension flowsheet reviewed?: Yes   Progress toward  BP goal: Unchanged  Self-Management Support :   Personal Goals (by the next clinic visit) :     Personal A1C goal: 7  (07/09/2009)     Personal blood pressure goal: 130/80  (07/09/2009)     Personal LDL goal: 70  (07/09/2009)    Patient will work on the following items until the next clinic visit to reach self-care goals:     Medications and monitoring: take my medicines every day, check my blood sugar, examine my feet every day  (07/09/2009)     Eating: eat more vegetables, use fresh or frozen vegetables  (07/09/2009)     Activity: take a 30 minute walk every day, join a walking program  (07/09/2009)     Home glucose monitoring frequency: 1 time daily  (07/09/2009)    Diabetes self-management support: Copy of home glucose meter record, Written self-care plan, Referred for DM self-management training  (07/09/2009)   Diabetes care plan printed    Hypertension self-management support: Copy of home glucose meter record, Written self-care plan  (07/09/2009)   Hypertension self-care plan printed.    Lipid self-management support: Written self-care plan  (07/09/2009)   Lipid self-care plan printed.   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) LOW BACK PAIN, CHRONIC (ICD-724.2) OBESITY (ICD-278.00) HYPERTRIGLYCERIDEMIA (ICD-272.1) PARESTHESIA (ICD-782.0) GOUT (ICD-274.9) GERD (ICD-530.81) DIABETES MELLITUS, TYPE II (ICD-250.00) PROSTATE CANCER, HX OF (ICD-V10.46) RHEUMATOID ARTHRITIS (NOT JUVENILE) (ICD-714.0) PROTEINURIA (ICD-791.0) ONYCHOMYCOSIS (ICD-110.1) HYPERTENSION, BENIGN SYSTEMIC (ICD-401.1) HYPERLIPIDEMIA (ICD-272.4) GASTROESOPHAGEAL REFLUX, NO ESOPHAGITIS (ICD-530.81)  Management Training Needs:   Follow-up DSMT(2 hours/year)  Laboratory  Results   Blood Tests   Date/Time Received: July 09, 2009 4:41 PM  Date/Time Reported: July 09, 2009 5:39 PM   HGBA1C: 6.9%   (Normal Range: Non-Diabetic - 3-6%   Control Diabetic - 6-8%)  Comments: ...............test performed by......Marland KitchenBonnie A. [place], MLS (ASCP)cm

## 2010-08-07 NOTE — Assessment & Plan Note (Signed)
Summary: Diabetes, foot pain   Vital Signs:  Patient profile:   59 year old male Height:      75 inches Weight:      254 pounds BMI:     31.86 Temp:     98.7 degrees F oral Pulse rate:   71 / minute BP sitting:   116 / 71  (right arm) Cuff size:   large  Vitals Entered By: Jimmy Footman, CMA (January 28, 2010 4:15 PM) CC: f/u diabetes, foot pain, Hypertension Management Is Patient Diabetic? Yes Did you bring your meter with you today? No Pain Assessment Patient in pain? no        Primary Care Provider:  Jamie Brookes MD  CC:  f/u diabetes, foot pain, and Hypertension Management.  History of Present Illness: Diabetes follow up: Pt comes in today to discuss his diabetes. He has not been checking his sugars for the last 2 months because he says the strips are too expensive. I discussed options with Paulino Rily and the best action is to have Mr. Brayfield call his insurance provider and see which meter and strips they carry. We will switch to that meter and strips in the future when he gives me this info. He is checking his feet. He says he use to cut calluses off his own feet. Encouraged him not to do this as he may cut himself and have a hard time healing. Pt hsa not had any chest pain or shortness of breath.   Foot/Heel pain: Pt is on his feet at work for many hours a day. He says after about 4-5 hours his feet begin to hurt in the heel and then the arches hurt as well. He has nice tennis shoes but has never tried inserts or orthotics.     Hypertension History:      He denies chest pain and dyspnea with exertion.  He notes no problems with any antihypertensive medication side effects.        Positive major cardiovascular risk factors include male age 53 years old or older, diabetes, hyperlipidemia, and hypertension.  Negative major cardiovascular risk factors include non-tobacco-user status.     Habits & Providers  Alcohol-Tobacco-Diet     Tobacco Status: never  Current  Medications (verified): 1)  Bayer Childrens Aspirin 81 Mg Chew (Aspirin) .... Take 1 Tablet By Mouth Once A Day 2)  Glucophage 1000 Mg Tabs (Metformin Hcl) .... Take 1 Tablet By Mouth Twice A Day 3)  Lisinopril-Hydrochlorothiazide 20-25 Mg  Tabs (Lisinopril-Hydrochlorothiazide) .Marland Kitchen.. 1 Tab By Mouth Daily 4)  Zocor 40 Mg Tabs (Simvastatin) .... Take 1 Tablet By Mouth At Bedtime 5)  Methotrexate 2.5 Mg  Tabs (Methotrexate Sodium) .Marland Kitchen.. 1 Tab By Mouth Daily 6)  Viagra .Marland Kitchen.. 1 Tab By Mouth Prior To Intercourse 7)  Accu-Chek Aviva  Strp (Glucose Blood) .... Use As Directed To Check Sugars 1-2 Times Daily 8)  Folic Acid 400 Mcg Tabs (Folic Acid) .... Take 2 Tabs Daily  Allergies (verified): No Known Drug Allergies  Family History: Mother with DM-on insulin wife on insulin  Review of Systems        vitals reviewed and pertinent negatives and positives seen in HPI   Physical Exam  General:  Well-developed,well-nourished,in no acute distress; alert,appropriate and cooperative throughout examination, vital signs reviewed Mouth:  multiple missing teeth Lungs:  Normal respiratory effort, chest expands symmetrically. Lungs are clear to auscultation, no crackles or wheezes. Heart:  Normal rate and regular rhythm. S1 and S2  normal without gallop, murmur, click, rub or other extra sounds.  Diabetes Management Exam:    Foot Exam (with socks and/or shoes not present):       Sensory-Monofilament:          Left foot: diminished          Right foot: diminished       Inspection:          Left foot: normal          Right foot: normal       Nails:          Left foot: thickened          Right foot: thickened   Impression & Recommendations:  Problem # 1:  DIABETES MELLITUS, TYPE II (ICD-250.00) Assessment Deteriorated  Pt has not been testing his CBG's. He says he does not have enought money to pay for the strips. Suggested that he talk to his insurance company and find out which one they cover. I  am happy to switch his meter over to that one that they cover. His A1c is worse today. Will need to see him back in 3 months for a recheck. Taking all meds appropriately.   His updated medication list for this problem includes:    Bayer Childrens Aspirin 81 Mg Chew (Aspirin) .Marland Kitchen... Take 1 tablet by mouth once a day    Glucophage 1000 Mg Tabs (Metformin hcl) .Marland Kitchen... Take 1 tablet by mouth twice a day    Lisinopril-hydrochlorothiazide 20-25 Mg Tabs (Lisinopril-hydrochlorothiazide) .Marland Kitchen... 1 tab by mouth daily  Orders: A1C-[facility] (82956) [facility]- Est Level  3 (21308)  Problem # 2:  FOOT PAIN, LEFT (ICD-729.5) Assessment: New Suggested that the pt get Superfeet or some other type of shoe insert. Pt is on his feet for many hours a day. These will likely help him  a lot.   Orders: [facility]- Est Level  3 (65784)  Complete Medication List: 1)  Bayer Childrens Aspirin 81 Mg Chew (Aspirin) .... Take 1 tablet by mouth once a day 2)  Glucophage 1000 Mg Tabs (Metformin hcl) .... Take 1 tablet by mouth twice a day 3)  Lisinopril-hydrochlorothiazide 20-25 Mg Tabs (Lisinopril-hydrochlorothiazide) .Marland Kitchen.. 1 tab by mouth daily 4)  Zocor 40 Mg Tabs (Simvastatin) .... Take 1 tablet by mouth at bedtime 5)  Methotrexate 2.5 Mg Tabs (Methotrexate sodium) .Marland Kitchen.. 1 tab by mouth daily 6)  Viagra  .Marland Kitchen.. 1 tab by mouth prior to intercourse 7)  Accu-chek Aviva Strp (Glucose blood) .... Use as directed to check sugars 1-2 times daily 8)  Folic Acid 400 Mcg Tabs (Folic acid) .... Take 2 tabs daily  Hypertension Assessment/Plan:      The patient's hypertensive risk group is category C: Target organ damage and/or diabetes.  His calculated 10 year risk of coronary heart disease is 14 %.  Today's blood pressure is 116/71.  His blood pressure goal is < 130/80.  Patient Instructions: 1)  Call your insurance company and ask them if they cover One Touch meters and strips or some other brand? Which type of meter and strips would be the cheapest?    2)  Try getting some heel cups for your shoes. If you want to try the entire sole get something called "Superfeet". You can get them at running stores or stores like [employer]. The are about $30.  3)  I will see you in 3 months for a recheck or your diabetes.   Laboratory Results   Blood Tests  Date/Time Received: January 28, 2010 4:18 PM  Date/Time Reported: January 28, 2010 4:39 PM   HGBA1C: 7.4%   (Normal Range: Non-Diabetic - 3-6%   Control Diabetic - 6-8%)  Comments: ...............test performed by......Marland KitchenBonnie A. [place], MLS (ASCP)cm       Prevention & Chronic Care Immunizations   Influenza vaccine: Not documented    Tetanus booster: Not documented   Td booster deferral: Not indicated  (07/09/2009)   Tetanus booster due: 07/06/2016    Pneumococcal vaccine: Done.  (07/06/2001)   Pneumococcal vaccine due: None    H. zoster vaccine: Not documented  Colorectal Screening   Hemoccult: Done.  (02/08/2005)   Hemoccult due: Not Indicated    Colonoscopy: Done.  (01/08/2005)   Colonoscopy due: 01/09/2015  Other Screening   PSA: normal per patient  (01/13/2009)   PSA due due: 01/13/2010   Smoking status: never  (01/28/2010)  Diabetes Mellitus   HgbA1C: 7.4  (01/28/2010)   Hemoglobin A1C due: 01/06/2008    Eye exam: normal  (11/04/2007)   Diabetic eye exam action/deferral: Not indicated  (07/09/2009)   Eye exam due: 11/03/2008    Foot exam: yes  (01/28/2010)   Foot exam action/deferral: Do today   High risk foot: Not documented   Foot care education: Not documented   Foot exam due: 07/26/2009    Urine microalbumin/creatinine ratio: Not documented   Urine microalbumin/cr due: 09/08/2007    Diabetes flowsheet reviewed?: Yes   Progress toward A1C goal: Deteriorated  Lipids   Total Cholesterol: 134  (09/27/2009)   Lipid panel action/deferral: Lipid Panel ordered   LDL: 65  (09/27/2009)   LDL Direct: Not documented   HDL: 33  (09/27/2009)   Triglycerides: 182   (09/27/2009)    SGOT (AST): 12  (09/27/2009)   BMP action: Ordered   SGPT (ALT): 8  (09/27/2009)   Alkaline phosphatase: 41  (09/27/2009)   Total bilirubin: 0.5  (09/27/2009)    Lipid flowsheet reviewed?: Yes   Progress toward LDL goal: Unchanged  Hypertension   Last Blood Pressure: 116 / 71  (01/28/2010)   Serum creatinine: 0.99  (09/27/2009)   BMP action: Ordered   Serum potassium 4.2  (09/27/2009)    Hypertension flowsheet reviewed?: Yes   Progress toward BP goal: Improved  Self-Management Support :   Personal Goals (by the next clinic visit) :     Personal A1C goal: 7  (07/09/2009)     Personal blood pressure goal: 130/80  (07/09/2009)     Personal LDL goal: 70  (07/09/2009)    Patient will work on the following items until the next clinic visit to reach self-care goals:     Medications and monitoring: check my blood sugar, examine my feet every day  (01/28/2010)     Eating: use fresh or frozen vegetables  (01/28/2010)     Activity: take a 30 minute walk every day, join a walking program  (07/09/2009)     Home glucose monitoring frequency: 1 time daily  (07/09/2009)    Diabetes self-management support: Written self-care plan  (01/28/2010)   Diabetes care plan printed    Hypertension self-management support: Written self-care plan  (01/28/2010)   Hypertension self-care plan printed.    Lipid self-management support: Written self-care plan  (01/28/2010)   Lipid self-care plan printed.   Nursing Instructions: Diabetic foot exam today

## 2010-08-07 NOTE — Assessment & Plan Note (Signed)
Summary: cold & allergy s/s/Comal/strother   Vital Signs:  Patient profile:   60 year old male Height:      75 inches Weight:      252.3 pounds BMI:     31.65 Temp:     98.8 degrees F oral Pulse rate:   98 / minute BP sitting:   120 / 80  (left arm) Cuff size:   large  Vitals Entered By: Garen Grams LPN (February 17, 2010 8:54 AM) CC: cough/congestion/drainage x 2 days Is Patient Diabetic? Yes Did you bring your meter with you today? Yes Pain Assessment Patient in pain? no        Primary Care Radley Barto:  Jamie Brookes MD  CC:  cough/congestion/drainage x 2 days.  History of Present Illness: 1) Cough / nasal congestion: x 2 days. Mild sinus pressure, cough with clear sputum, nasal congestion and clear drainage, mildly itchy eyes x 2 days. Reports that he had similar symptoms this spring and Allegra-D and Flonase (prescribed by Urgent Care) worked for him. He has been using Flonase as instructed with some relief, but ran out of the Allegra D back in the spring. Unsure if he has seasonal allergies.   ROS: Denies: chest pain, dyspnea, tobacco use, headache, fever, chills, nausea, emesis, sore throat, diarrhea, constipation, myalgia, sick contact   See prior meds for med rec. Also Flonase.   Habits & Providers  Alcohol-Tobacco-Diet     Tobacco Status: never  Medications Prior to Update: 1)  Bayer Childrens Aspirin 81 Mg Chew (Aspirin) .... Take 1 Tablet By Mouth Once A Day 2)  Glucophage 1000 Mg Tabs (Metformin Hcl) .... Take 1 Tablet By Mouth Twice A Day 3)  Lisinopril-Hydrochlorothiazide 20-25 Mg  Tabs (Lisinopril-Hydrochlorothiazide) .Marland Kitchen.. 1 Tab By Mouth Daily 4)  Zocor 40 Mg Tabs (Simvastatin) .... Take 1 Tablet By Mouth At Bedtime 5)  Methotrexate 2.5 Mg  Tabs (Methotrexate Sodium) .Marland Kitchen.. 1 Tab By Mouth Daily 6)  Viagra .Marland Kitchen.. 1 Tab By Mouth Prior To Intercourse 7)  Accu-Chek Aviva  Strp (Glucose Blood) .... Use As Directed To Check Sugars 1-2 Times Daily 8)  Folic Acid 400 Mcg  Tabs (Folic Acid) .... Take 2 Tabs Daily  Allergies (verified): No Known Drug Allergies  Physical Exam  General:  obese, NAD, pleasant  Head:  non tender over maxillary and frontal sinuses  Eyes:  no conjunctivitis or drainage  Nose:  pale mucosa, congestion, clear mucoid drainage  Mouth:  no erythema or exudate  Neck:  no lymphadenopathy   Lungs:  CTAB w/o wheeze or crackles  Heart:  Normal rate and regular rhythm. S1 and S2 normal without gallop, murmur, click, rub or other extra sounds. Abdomen:  soft, non-tender, and normal bowel sounds.   Skin:  no rash    Impression & Recommendations:  Problem # 1:  COUGH (ICD-786.2) Assessment New  Seasonal allergies vs. viral URI at top of differential. Will continue Flonase, treat with Allegra (at patient request as this worked in the past for him). Would AVOID Allegra-D or other pseudoephedrine containing medications given HTN. Symptomatic management discussed. Advised to discuss with PCP in the future regarding possible diagnosis of seasonal alleriges.   Orders: [facility]- Est Level  3 (56213)  Complete Medication List: 1)  Bayer Childrens Aspirin 81 Mg Chew (Aspirin) .... Take 1 tablet by mouth once a day 2)  Glucophage 1000 Mg Tabs (Metformin hcl) .... Take 1 tablet by mouth twice a day 3)  Lisinopril-hydrochlorothiazide 20-25 Mg Tabs (Lisinopril-hydrochlorothiazide) .Marland KitchenMarland KitchenMarland Kitchen  1 tab by mouth daily 4)  Zocor 40 Mg Tabs (Simvastatin) .... Take 1 tablet by mouth at bedtime 5)  Methotrexate 2.5 Mg Tabs (Methotrexate sodium) .Marland Kitchen.. 1 tab by mouth daily 6)  Viagra  .Marland Kitchen.. 1 tab by mouth prior to intercourse 7)  Accu-chek Aviva Strp (Glucose blood) .... Use as directed to check sugars 1-2 times daily 8)  Folic Acid 400 Mcg Tabs (Folic acid) .... Take 2 tabs daily 9)  Fexofenadine Hcl 60 Mg Tabs (Fexofenadine hcl) .... One tab by mouth two times a day as needed for congestion Prescriptions: FEXOFENADINE HCL 60 MG TABS (FEXOFENADINE HCL) one tab by mouth  two times a day as needed for congestion  #30 x 0   Entered and Authorized by:   Bobby Rumpf  MD   Signed by:   Bobby Rumpf  MD on 02/17/2010   Method used:   Electronically to        [facility]* (retail)       [address]       [address] Shipping/mailing       [place], [place]  [address]       Ph: [phone]       Fax: [phone]   RxID:   [id number]

## 2010-08-07 NOTE — Letter (Signed)
Summary: Generic Letter  [facility]  [address]   [place], [place] [address]   Phone: [phone]  Fax: [phone]    09/30/2009  Eric Holloway [address] [place], [place]  [address]  Dear [name],  Basic Metabolic Test (fasting glucose it high)   Sodium                    141 mEq/L                   135-145   Potassium                 4.2 mEq/L                   3.5-5.3   Chloride                  103 mEq/L                   96-112   CO2                       28 mEq/L                    19-32   Glucose              [H]  129 mg/dL                   32-44   BUN                       13 mg/dL                    0-10   Creatinine                0.99 mg/dL                  0.40-1.50   Calcium                   9.4 mg/dL                   2.7-25.3  Tests: Lipid Profile(triglycerides are high and good cholesterol is low)   Cholesterol               134 mg/dL                   6-644     Triglyceride         [H]  182 mg/dL                   <034   HDL Cholesterol(good)[L]  33 mg/dL                    >74   LDL Cholesterol(bad)      65 mg/dL                    2-59       Tests: (3) Liver Profile (everything is normal)   Bilirubin, Total          0.5 mg/dL                   5.6-3.8   Bilirubin, Direct         0.1 mg/dL  0.0-0.3   Indirect Bilirubin        0.4 mg/dL                   1.6-1.0   Alkaline Phosphatase      41 U/L                      39-117   AST/SGOT                  12 U/L                      0-37   ALT/SGPT                  8 U/L                       0-53   Total Protein             6.8 g/dL                    9.6-0.4   Albumin                   3.8 g/dL                    5.4-0.9  These lab results indicate that you have some extra sugar and carbohydrates that you need to cut out of your diet. You also need to exercise in order to increase your good cholesterol. Try starting with walking 15 minutes a day and work up to 1  hour a day. Call me if you have any questions about your labs.   Sincerely,   Jamie Brookes MD  Appended Document: Generic Letter letter sent

## 2010-08-07 NOTE — Miscellaneous (Signed)
Summary: Orders Update  Clinical Lists Changes  Problems: Added new problem of ENCOUNTER FOR LONG-TERM USE OF OTHER MEDICATIONS (ICD-V58.69) Orders: Added new Test order of B12-[facility] [phone]) - Signed Added new Test order of CBC-[facility] (62130) - Signed   OK per Dr. Clotilde Dieter

## 2010-08-08 ENCOUNTER — Ambulatory Visit (INDEPENDENT_AMBULATORY_CARE_PROVIDER_SITE_OTHER): Payer: Commercial Managed Care - PPO | Admitting: Family Medicine

## 2010-08-08 ENCOUNTER — Encounter: Payer: Self-pay | Admitting: Family Medicine

## 2010-08-08 ENCOUNTER — Ambulatory Visit: Admit: 2010-08-08 | Payer: Self-pay

## 2010-08-08 DIAGNOSIS — I10 Essential (primary) hypertension: Secondary | ICD-10-CM

## 2010-08-08 DIAGNOSIS — E119 Type 2 diabetes mellitus without complications: Secondary | ICD-10-CM

## 2010-08-08 LAB — CONVERTED CEMR LAB: Hgb A1c MFr Bld: 6.3 %

## 2010-08-20 ENCOUNTER — Other Ambulatory Visit: Payer: Self-pay | Admitting: Family Medicine

## 2010-08-20 NOTE — Telephone Encounter (Signed)
Please review and refill

## 2010-08-21 NOTE — Assessment & Plan Note (Signed)
Summary: DM2, HTN   Vital Signs:  Patient profile:   60 year old male Height:      75 inches Weight:      239 pounds Temp:     98.1 degrees F oral Pulse rate:   66 / minute Pulse rhythm:   regular BP sitting:   118 / 71  (right arm) Cuff size:   regular CC: DM2, Hypertension Is Patient Diabetic? Yes Did you bring your meter with you today? Yes   Primary Care Provider:  Jamie Brookes MD  CC:  DM2 and Hypertension.  History of Present Illness: DM2: Pt has started the [employer] Diabetes [facility]ness program. He is doing exceptionally well. His blood sugars have all ben 150 or less. He has quit eating after 6:30 pm. He is exercising 2 days a week. He is walking on a treadmill and exercise bike. He is logging 1-2 miles (30-40 min).   Hypertension: His BP is coming down. He is keeping a log of his BP. He is checking it with a arm monitor.   Habits & Providers  Alcohol-Tobacco-Diet     Tobacco Status: never  Exercise-Depression-Behavior     Have you felt down or hopeless? no     Have you felt little pleasure in things? no     Depression Counseling: not indicated; screening negative for depression     Seat Belt Use: always  Current Medications (verified): 1)  Bayer Childrens Aspirin 81 Mg Chew (Aspirin) .... Take 1 Tablet By Mouth Once A Day 2)  Glucophage 1000 Mg Tabs (Metformin Hcl) .... Take 1 Tablet By Mouth Twice A Day 3)  Lisinopril-Hydrochlorothiazide 20-25 Mg  Tabs (Lisinopril-Hydrochlorothiazide) .Marland Kitchen.. 1 Tab By Mouth Daily 4)  Zocor 40 Mg Tabs (Simvastatin) .... Take 1 Tablet By Mouth At Bedtime 5)  Methotrexate 2.5 Mg  Tabs (Methotrexate Sodium) .Marland Kitchen.. 1 Tab By Mouth Daily 6)  Viagra .Marland Kitchen.. 1 Tab By Mouth Prior To Intercourse 7)  Accu-Chek Aviva  Strp (Glucose Blood) .... Use As Directed To Check Sugars 1-2 Times Daily 8)  Folic Acid 400 Mcg Tabs (Folic Acid) .... Take 2 Tabs Daily 9)  Fexofenadine Hcl 60 Mg Tabs (Fexofenadine Hcl) .... One Tab By Mouth Two Times A Day As  Needed For Congestion 10)  Lancets  Misc (Lancets) .... Please Give Qs For Once A Day Testing For 3 Months At A Time. 11)  Alcohol Preps  Pads (Alcohol Swabs) .... Use To Clean Finger Prior To Glucose Testing. Qs For 90 Days Once A Day Testing 12)  Choice Dm Fora G20 Test Strips  Strp (Glucose Blood) .... Please Provide Test Strips For True Result Meter, Count 90, Testing Once Daily  Allergies (verified): No Known Drug Allergies  Social History: Risk analyst Use:  always  Review of Systems        vitals reviewed and pertinent negatives and positives seen in HPI   Physical Exam  General:  Well-developed,well-nourished,in no acute distress; alert,appropriate and cooperative throughout examination Lungs:  Normal respiratory effort, chest expands symmetrically. Lungs are clear to auscultation, no crackles or wheezes. Heart:  Normal rate and regular rhythm. S1 and S2 normal without gallop, murmur, click, rub or other extra sounds. Psych:  Cognition and judgment appear intact. Alert and cooperative with normal attention span and concentration. No apparent delusions, illusions, hallucinations   Impression & Recommendations:  Problem # 1:  DIABETES MELLITUS, TYPE II (ICD-250.00) Assessment Improved Pt is doing a Diabetes work program, going once weekly. Will  plan to see the patient again in May. Will retest his A1c in May.    His updated medication list for this problem includes:    Bayer Childrens Aspirin 81 Mg Chew (Aspirin) .Marland Kitchen... Take 1 tablet by mouth once a day    Glucophage 1000 Mg Tabs (Metformin hcl) .Marland Kitchen... Take 1 tablet by mouth twice a day    Lisinopril-hydrochlorothiazide 20-25 Mg Tabs (Lisinopril-hydrochlorothiazide) .Marland Kitchen... 1 tab by mouth daily  Orders: A1C-[facility] ([address]) [facility]- Est Level  3 (32440)  Problem # 2:  HYPERTENSION, BENIGN SYSTEMIC (ICD-401.1) Assessment: Improved Pt's HTN is improving.    His updated medication list for this problem includes:     Lisinopril-hydrochlorothiazide 20-25 Mg Tabs (Lisinopril-hydrochlorothiazide) .Marland Kitchen... 1 tab by mouth daily  Orders: [facility]- Est Level  3 ([address]272)  Complete Medication List: 1)  Bayer Childrens Aspirin 81 Mg Chew (Aspirin) .... Take 1 tablet by mouth once a day 2)  Glucophage 1000 Mg Tabs (Metformin hcl) .... Take 1 tablet by mouth twice a day 3)  Lisinopril-hydrochlorothiazide 20-25 Mg Tabs (Lisinopril-hydrochlorothiazide) .Marland Kitchen.. 1 tab by mouth daily 4)  Zocor 40 Mg Tabs (Simvastatin) .... Take 1 tablet by mouth at bedtime 5)  Methotrexate 2.5 Mg Tabs (Methotrexate sodium) .Marland Kitchen.. 1 tab by mouth daily 6)  Viagra  .Marland Kitchen.. 1 tab by mouth prior to intercourse 7)  Accu-chek Aviva Strp (Glucose blood) .... Use as directed to check sugars 1-2 times daily 8)  Folic Acid 400 Mcg Tabs (Folic acid) .... Take 2 tabs daily 9)  Fexofenadine Hcl 60 Mg Tabs (Fexofenadine hcl) .... One tab by mouth two times a day as needed for congestion 10)  Lancets Misc (Lancets) .... Please give qs for once a day testing for 3 months at a time. 11)  Alcohol Preps Pads (Alcohol swabs) .... Use to clean finger prior to glucose testing. qs for 90 days once a day testing 12)  Choice Dm Fora G20 Test Strips Strp (Glucose blood) .... Please provide test strips for true result meter, count 90, testing once daily  Patient Instructions: 1)  Your weight was 252 and is now 239. Great job! 2)  Your A1c was 7.2 and is now 6.3. Great job again! 3)  Keep up the walking and eating vegetables.  4)  When we get your A1c to 5.9 or less, we will consider stopping your metformin or at least lowering it! 5)  I will see you in 4 months.    Orders Added: 1)  A1C-[facility] [83036] 2)  [facility]- Est Level  3 [99213]    Laboratory Results   Blood Tests   Date/Time Received: August 08, 2010 4:46 PM  Date/Time Reported: August 08, 2010 5:04 PM   HGBA1C: 6.3%   (Normal Range: Non-Diabetic - 3-6%   Control Diabetic - 6-8%)   Comments: ...............test performed by............Marland KitchenLoralee Pacas, CMA ...............entered by...........Marland KitchenBonnie A. [place], MLS (ASCP)cm

## 2010-09-24 ENCOUNTER — Other Ambulatory Visit: Payer: Self-pay | Admitting: Family Medicine

## 2010-09-24 NOTE — Telephone Encounter (Signed)
Refill request

## 2010-10-30 ENCOUNTER — Encounter: Payer: Self-pay | Admitting: Family Medicine

## 2010-10-30 ENCOUNTER — Ambulatory Visit (INDEPENDENT_AMBULATORY_CARE_PROVIDER_SITE_OTHER): Payer: Commercial Managed Care - PPO | Admitting: Family Medicine

## 2010-10-30 DIAGNOSIS — I10 Essential (primary) hypertension: Secondary | ICD-10-CM

## 2010-10-30 DIAGNOSIS — R1032 Left lower quadrant pain: Secondary | ICD-10-CM | POA: Insufficient documentation

## 2010-10-30 NOTE — Assessment & Plan Note (Signed)
1 day history of abdominal pain that had pretty much resolved by the time he was evaluated.  Well appearing.  Non-toxic.  No fevers.  No signs of serious infection or blockage.  Precautions given.  Denied pain medicine.

## 2010-10-30 NOTE — Assessment & Plan Note (Signed)
Unsure if the readings that he was getting at home were accurate because his blood pressure in clinic was great and there was no difference from side to side recordings.  No signs of vascular blockage in either arm.  Precautions given.  Advised him to continue to check his blood pressure in both arms and return to clinic if he continues to get a discrepancy.

## 2010-10-30 NOTE — Progress Notes (Signed)
  Subjective:    Patient ID: Eric Holloway, male    DOB: 1950-09-23, 60 y.o.   MRN: [id number]  Hypertension This is a chronic problem. The current episode started more than 1 year ago. The problem is controlled (Did have some elevated BPs with the pain.  It was also different from left arm to right arm.  Up to 190/90 in the left arm compared to 130/90 in the right arm.  This has since resolved in clinic.). Pertinent negatives include no anxiety, blurred vision, chest pain, peripheral edema or shortness of breath. There are no associated agents to hypertension.  Abdominal Pain This is a new problem. The current episode started yesterday. The onset quality is gradual. The problem occurs constantly. The most recent episode lasted 12 hours. The problem has been rapidly improving. The pain is located in the LLQ. The pain is at a severity of 3/10. The pain is mild. The quality of the pain is colicky, cramping and dull. The abdominal pain does not radiate. Pertinent negatives include no anorexia, arthralgias, belching, constipation, diarrhea, dysuria, fever, flatus, frequency, hematochezia, hematuria, melena, myalgias, nausea or vomiting. The pain is aggravated by nothing. The pain is relieved by nothing. He has tried nothing for the symptoms. His past medical history is significant for abdominal surgery. Prostate cancer       Review of Systems  Constitutional: Negative for fever.  Eyes: Negative for blurred vision.  Respiratory: Negative for shortness of breath.   Cardiovascular: Negative for chest pain.  Gastrointestinal: Positive for abdominal pain. Negative for nausea, vomiting, diarrhea, constipation, melena, hematochezia, anorexia and flatus.  Genitourinary: Negative for dysuria, frequency and hematuria.  Musculoskeletal: Negative for myalgias and arthralgias.       Objective:   Physical Exam  Constitutional: He appears well-nourished. No distress.  Eyes: Conjunctivae are normal.  Neck:  Normal range of motion. Neck supple. No JVD present. No thyromegaly present.  Cardiovascular: Normal rate, regular rhythm and normal heart sounds.   No murmur heard. Pulmonary/Chest: Breath sounds normal. He is in respiratory distress. He has no wheezes.  Abdominal: Soft. Bowel sounds are normal. He exhibits no distension and no mass. There is no tenderness. There is no rebound and no guarding.       Mildly TTP in LLQ  Genitourinary: Penis normal.       No inguinal or other hernia appreciated  Lymphadenopathy:    He has no cervical adenopathy.  Skin: Skin is warm and dry. No rash noted.          Assessment & Plan:

## 2010-10-30 NOTE — Patient Instructions (Signed)
Abdominal Pain   Abdominal pain can be caused by many things. Your caregiver decides the seriousness of your pain by an examination and possibly blood tests and X-rays. Many cases can be observed and treated at home. Most abdominal pain is not caused by a disease and will probably improve without treatment. However, in many cases, more time must pass before a clear cause of the pain can be found. Before that point, it may not be known if you need more testing, or if hospitalization or surgery is needed.   HOME CARE INSTRUCTIONS   Do not take laxatives unless directed by your caregiver.   Take pain medicine only as directed by your caregiver.   Only take over-the-counter or prescription medicines for pain, discomfort, or fever as directed by your caregiver.   Try a clear liquid diet (broth, tea, or water) for 2 days or as ordered by your caregiver. Slowly move to a bland diet as tolerated.   SEEK IMMEDIATE MEDICAL CARE IF:   The pain does not go away.   You or your child has an oral temperature above 100.4, not controlled by medicine.   You keep throwing up (vomiting).   The pain is felt only in portions of the abdomen. Pain in the right side could possibly be appendicitis. In an adult, pain in the left lower portion of the abdomen could be colitis or diverticulitis.   You pass bloody or black tarry stools.   MAKE SURE YOU:   Understand these instructions.   Will watch your condition.   Will get help right away if you are not doing well or get worse.   Document Released: 04/01/2005 Document Re-Released: 09/16/2009   [employer] Patient Information 2011 [employer].

## 2010-11-21 NOTE — Assessment & Plan Note (Signed)
[facility]                            CARDIOLOGY OFFICE NOTE   NAME:Eric Holloway, Eric Holloway                     MRN:          [id number]  DATE:06/17/2006                            DOB:          Aug 22, 1950    PRIMARY CARE PHYSICIAN:  Dr. Lynnea Ferrier II   REASON FOR VISIT:  Follow up cardiac catheterization.   HISTORY OF PRESENT ILLNESS:  I saw Mr. Hurwitz back in late October, at  which time he had presented with progressive fatigue and chest  discomfort.  He had been working fairly long hours, but had significant  risk factors for coronary artery disease including type 2 diabetes  mellitus, hypertension and hyperlipidemia.  I referred him for a  diagnostic heart catheterization and fortunately, he had very good  results with essentially normal coronary arteries and normal systolic  function.  He comes in today for follow up and states that actually he  has felt quite well since cutting back his hours.  I reassured him about  his cardiac status and encouraged him to continue regular follow up with  his primary care physician for risk factor modification.   His electrocardiogram today shows normal sinus rhythm with decreased  intraseptal R wave progression.   ALLERGIES:  No known drug allergies.   PRESENT MEDICATIONS:  1. Metformin 1000 mg p.o. b.i.d.  2. Benazepril/Hydrochlorothiazide 20/12.5 mg p.o. daily.  3. Glipizide 10 mg p.o. daily.  4. Methotrexate 2.5 mg weekly.  5. Zocor 40 mg p.o. daily.  6. Aspirin 325 mg p.o. daily.  7. Zantac 75 mg p.o.   REVIEW OF SYSTEMS:  As described in the History of Present Illness.   EXAMINATION:  Blood pressure 120/74, heart rate is 76, weight 254  pounds, exam otherwise unchanged.   IMPRESSION/RECOMMENDATIONS:  1. Reassuring cardiac catheterization showing essentially normal      coronary arteries with normal ejection fraction in a setting of      known type 2 diabetes mellitus, hypertension,  hyperlipidemia.  Will      continue his strategy for risk factor modification.  He will      continue to      follow up with Dr. Tanya Nones.  2. We can see the patient back as needed.     Jonelle Sidle, MD  Electronically Signed    SGM/MedQ  DD: 06/17/2006  DT: 06/18/2006  Job #: [id number]   cc:   Broadus John T. Pamalee Leyden, MD

## 2010-11-21 NOTE — Cardiovascular Report (Signed)
NAME:  Eric Holloway, Eric Holloway              ACCOUNT NO.:  [id number]   MEDICAL RECORD NO.:  [id number]          PATIENT TYPE:  OIB   LOCATION:  1961                         FACILITY:  [facility]   PHYSICIAN:  Veverly Fells. Excell Seltzer, MD  DATE OF BIRTH:  1951/02/20   DATE OF PROCEDURE:  05/07/2006  DATE OF DISCHARGE:  05/07/2006                              CARDIAC CATHETERIZATION   PROCEDURE:  1. Left heart catheterization.  2. Selective coronary angiography.  3. Left ventricular angiography.   INDICATIONS:  Mr. Espindola is a very nice 60 year old gentleman who works at  [employer] and has a recent history of typical exertional angina.  He has cardiovascular risk factors of hypertension, type 2 diabetes, and  dyslipidemia.  With his typical symptoms and multiple risk factors, he was  referred for diagnostic cardiac catheterization.   PROCEDURAL DETAILS:  Risks and indications of the procedure were explained  to the patient.  Informed consent was obtained.  The right groin was prepped  and draped and anesthetized with 1% lidocaine under normal sterile  conditions.  Using the modified Seldinger technique, a 4-French arterial  sheath was placed in the right common femoral artery.  Multiple angiographic  views about the left and right coronary arteries were taken.  For the left  coronary artery, a 4-French JL4 catheter was used.  For the right coronary  artery, a 4-French 3DRC catheter was used.   Following selective coronary angiography, an angled pigtail catheter was  inserted in the left ventricle and left ventricular pressures were recorded.  A 30-degree right anterior oblique left ventriculogram was performed.  A  pullback across the aortic valve was done.  At the conclusion of the case,  the patient was transferred to the holding area where his sheath will be  pulled and manual pressure used for hemostasis.  All catheter exchanges were  performed over a guidewire.   FINDINGS:  Aortic  pressure 120/73 with a mean of 96, left ventricular  pressure 121/7 with an end-diastolic pressure of 16.  There was no aortic  stenosis.   Left main stem is a short segment and is angiographically normal.  It  bifurcates into the LAD and left circumflex.  The LAD is a large diameter  vessel that courses down to the left ventricular apex.  It gives off a small  first diagonal branch and a large second diagonal branch.  The LAD  throughout its proximal, mid and distal segments, as well as the diagonal  branches, have no significant angiographic disease.   The left circumflex is a large diameter vessel that is dominant.  It gives  off a first obtuse marginal branch, two posterolateral branches, as well as  a left PDA branch.  The circumflex throughout its territory has no  significant angiographic disease.   The right coronary artery is small diameter.  It gives off two RV marginal  branches in its mid-portion.  Distally it gives off a very small PL branch.  There is no significant disease in the right coronary artery.   Left ventricular function is normal.  The left ventriculogram demonstrates  no mitral regurgitation.  The left ventricular ejection fraction is 65%.   ASSESSMENT:  1. Normal coronary arteries.  2. Normal left ventricular function.   PLAN:  Would consider a noncardiac cause of the patient's chest pain.  He  should continue with risk factor modification.  His blood pressure appears  well controlled on today's exam.  It is possible with his longstanding  hypertension that he may have hypertensive heart disease and this could  contribute to chest pain.  However, I would suspect a noncardiac etiology.      Veverly Fells. Excell Seltzer, MD  Electronically Signed     MDC/MEDQ  D:  05/07/2006  T:  05/08/2006  Job:  [id number]   cc:   Jonelle Sidle, MD  Priscille Heidelberg. Pamalee Leyden, MD

## 2010-11-21 NOTE — Op Note (Signed)
NAME:  Eric Holloway, Eric Holloway                        ACCOUNT NO.:  [id number]   MEDICAL RECORD NO.:  [id number]                   PATIENT TYPE:  INP   LOCATION:  0003                                 FACILITY[facility]   PHYSICIAN:  Maretta Bees. Vonita Moss, M.D.             DATE OF BIRTH:  03-Jan-1951   DATE OF PROCEDURE:  12/31/2003  DATE OF DISCHARGE:                                 OPERATIVE REPORT   PREOPERATIVE DIAGNOSIS:  Prostatic carcinoma.   POSTOPERATIVE DIAGNOSIS:  Prostatic carcinoma.   PROCEDURE:  1. Pelvic exploration.  2. Bilateral pelvic lymph node dissection.   SURGEON:  Dr. Larey Dresser   ASSISTANT:  Dr. Gaynelle Arabian   ANESTHESIA:  General.   INDICATIONS:  This 60 year old black male presented to me with a PSA of  14.53 in April and underwent a prostate biopsy that showed Gleason grade 7  carcinoma, occupying 40% of the tissue biopsy from the left side, and atypia  was noted on the right side.  He was counseled about therapeutic measures  and cleared for surgery, having chosen radical prostatectomy.   DESCRIPTION OF PROCEDURE:  The patient was brought to the operating room and  placed in a supine position.  The external genitalia and lower abdomen were  prepped and draped in the usual fashion.  A 24 French Foley catheter could  not be inserted, and therefore I put in an 75 [place] coude catheter that  went in without particular difficulty.  Clear urine was obtained.  A midline  lower abdominal incision was made from below the umbilicus to the symphysis  pubis.  The rectus fascia was opened in the midline.  The dissection of the  bilateral pelvic retroperitoneal spaces was difficult in that the fatty  tissue was abnormally dense and fibrous.  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  His body habitus was such that the bilateral obturator  lymph node packets were quite deep on each side and had some abnormal  crossing venous channels.  Over the major vessels and  obturator nerve were  subsequently identified and allowed [place] to safely remove both bilateral  obturator lymph node packets which were sent to pathology for permanent  section.  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  The  prostate was also felt relatively small and deep under the symphysis pubis.  Dr. Earlene Plater and I dissected out the prostate on the right side and made an  assessment of the situation.  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  After further exploration and discussion, it was felt that the anomalous  blood vessels and the pelvic fibrolipomatosis and the very deep, shallow  pelvis would make removal of his prostate certainly hazardous at best.  Both  discussed the fact that having done remnants of prostates together and  obviously others with other urologists, we concluded that the risk of going  ahead was too great in view of the fact that he could be an excellent  candidate for radiation therapy.  We felt that risk of incontinence, major  hemorrhage and rectal injury in view of this gentleman's anatomy did not  warrant proceeding with a prostatectomy today.  Few small venous bleeders  were controlled with Hem-o-lok clips and the rectus muscle closed with  interrupted 0 chromic catgut and rectus fascia closed with running #1 PDS  and the wound and subcu irrigated and the skin closed with skin staples.  Foley catheter was connected to closed drainage.  Wound was dressed with dry  sterile gauze dressing.  Sponge, needle, and instrument count was correct.  The estimated blood loss was less than 100 mL.  He tolerated the procedure  well and was taken to the recovery  room in stable condition.                                               Maretta Bees. Vonita Moss, M.D.    LJP/MEDQ  D:  12/31/2003  T:  12/31/2003  Job:  [id number]

## 2010-11-21 NOTE — Discharge Summary (Signed)
NAME:  Eric Holloway, Eric Holloway                        ACCOUNT NO.:  [id number]   MEDICAL RECORD NO.:  [id number]                   PATIENT TYPE:  INP   LOCATION:  0345                                 FACILITY:  [facility]   PHYSICIAN:  Maretta Bees. Vonita Moss, M.D.             DATE OF BIRTH:  1951/03/14   DATE OF ADMISSION:  12/31/2003  DATE OF DISCHARGE:  01/02/2004                                 DISCHARGE SUMMARY   DISCHARGE DIAGNOSES:  1. Prostatic carcinoma.  2. Diabetes mellitus.  3. Rheumatoid arthritis.  4. Hypertension.  5. Hypercholesterolemia.   PROCEDURE:  Pelvic exploration and bilateral pelvic lymph node dissection on  December 31, 2003.   HISTORY OF PRESENT ILLNESS:  This 60 year old, African-American male  presented with a PSA of 14.53 and a biopsy showed Gleason grade 7 carcinoma  of the prostate in the left and atypia in the right.  CT scan showed simple  cyst in the right kidney and small bilateral cyst, but no evidence of  metastatic disease.  Bone scan showed no metastatic disease.  He was  counseled about therapies and he elected radical prostatectomy.   PHYSICAL EXAMINATION:  Noncontributory.   HOSPITAL COURSE:  After admission, he underwent pelvic exploration and  bilateral pelvic lymph node dissection.  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  He had slight fever postop felt to be  inefficient pulmonary toilet, but he had his Foley removed the day after  surgery and he voided okay and he resumed diet nicely.  He did have a small  skin bleeder controlled with metal hemoclips one day postop.  He was  ambulating well and feeling well and condition was satisfactory for  discharge on January 02, 2004.   DIET:  Diabetic diet.   ACTIVITY:  Light activity x6 weeks.   DISCHARGE MEDICATIONS:  1. Glucophage  1000 mg b.i.d.  2. Lipitor 10 mg daily.  3. Lotensin HCT 20/12.5 mg daily.  4. Prednisone 5 mg daily.  5. Azathioprine 100 mg b.i.d.  6. Cephalexin 500 mg q.i.d. x2 days.  7. Vicodin p.r.n. pain.   FOLLOW UP:  See me in the office next week for skin staple removal and  consultation about radiation therapy.                                               Maretta Bees. Vonita Moss, M.D.    LJP/MEDQ  D:  01/02/2004  T:  01/02/2004  Job:  [id number]

## 2010-11-21 NOTE — Assessment & Plan Note (Signed)
[facility]                              CARDIOLOGY OFFICE NOTE   NAME:Eric Holloway, Eric Holloway                     MRN:          [id number]  DATE:05/04/2006                            DOB:          [birth year]    REASON FOR CONSULTATION:  Fatigue and chest discomfort.   HISTORY OF PRESENT ILLNESS:  Eric Holloway is a pleasant 60 year old male with  a reported longstanding history of hypertension, type 2 diabetes mellitus,  and hyperlipidemia.  He has no previously documented history of coronary  artery disease or myocardial infarction.  Details are not complete but he  states that he had some type of cardiac testing (potentially an exercise  treadmill test) approximately 1-and-a-half years ago by another cardiology  practice.  He is not entirely clear on the details but was not told of any  major abnormalities.  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  He states he has been working more hours in the last few weeks  and approximately 1 week ago began to notice tightness in his chest when  he was at work.  He said that he had to cut back some and these symptoms has  subsequently resolved.  His electrocardiogram today shows sinus rhythm at 72  beats per minute.  He has been compliant with his medications per report.  We talked about further ischemic testing, given his risk factor profile and  relatively new onset symptoms from both a noninvasive and an invasive  perspective.  We reviewed the potential risks and benefits of both and at  this point our plan is to proceed with a diagnostic cardiac catheterization  to clearly assess his coronary anatomy.  He was in agreement with this.   ALLERGIES:  No known drug allergies.  No contrast or seafood allergies.   PRESENT MEDICATIONS:  1.  Metformin 1000 mg p.o. b.i.d.  2. Benazepril HCT 20/12.5 mg p.o. daily.  3. Glipizide 10 mg p.o. daily.  4. Methotrexate 2.5 mg taken as directed.  5. Simvastatin 40 mg p.o. daily.  6. Aspirin 325 mg p.o. daily.  7. Flomax 0.4 mg p.o. daily.  8. Zantac 25 mg p.o. p.r.n.   PAST MEDICAL HISTORY:  Is as outlined in the history of present illness.  The patient reports a history of rheumatoid arthritis.  Previous history of  surgery for prostate cancer in July 2005.  Facial surgery in the 1970s.   SOCIAL HISTORY:  The patient is married.  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  He has a remote tobacco use  history, having quit in 1972.  Denies any alcohol or other recreational  drugs.   FAMILY HISTORY:  Noncontributory for premature cardiovascular disease.   REVIEW OF SYSTEMS:  As described in the history of present illness.  He does  have reflux symptoms.  He has had occasional constipation, reports prior  problems with an ulcer.  Does have some urinary hesitancy and sexual  dysfunction, arthritis, and problems with gout as well.  No obvious bleeding  problems.   EXAMINATION:  VITAL SIGNS:  Blood pressure is 132/84, heart rate is 72,  weight is 255 pounds.  GENERAL:  This is an overweight male seated, in no acute distress.  HEENT:  Conjunctivae and lids normal, oropharynx clear.  NECK:  Supple without elevated jugular venous pressure or loud bruits.  No  thyromegaly is noted.  LUNGS:  Clear without labored breathing at rest.  CARDIAC:  Reveals a regular rate and rhythm without loud murmur or gallop.  ABDOMEN:  Soft, nontender, normal bowel sounds.  EXTREMITIES:  Show no pitting edema.  Distal pulses are 2+.  SKIN:  No ulcer changes noted.  MUSCULOSKELETAL:  No kyphosis noted.  NEUROPSYCHIATRIC:  The patient is alert and oriented x3.  Affect is normal.   IMPRESSION AND RECOMMENDATIONS:  1. Exertional fatigue and dyspnea with recent complaints  of chest      tightness in a 60 year old male with type 2 diabetes mellitus,      hypertension, and hyperlipidemia.  Resting electrocardiogram is normal.      He reports prior noninvasive cardiac testing which was apparently      reassuring, although results are not available.  After reviewing the      situation with him as outlined above, our plan is to proceed with a      diagnostic cardiac catheterization to clearly assess the coronary      anatomy.  2. Further plans to follow from there.     Jonelle Sidle, MD  Electronically Signed    SGM/MedQ  DD: 05/04/2006  DT: 05/05/2006  Job #: [id number]   cc:   Broadus John T. Pamalee Leyden, MD

## 2010-11-21 NOTE — H&P (Signed)
NAME:  Eric Holloway, Eric Holloway                        ACCOUNT NO.:  [id number]   MEDICAL RECORD NO.:  [id number]                   PATIENT TYPE:  INP   LOCATION:  0003                                 FACILITY:  [facility]   PHYSICIAN:  Maretta Bees. Vonita Moss, M.D.             DATE OF BIRTH:  Dec 11, 1950   DATE OF ADMISSION:  12/31/2003  DATE OF DISCHARGE:                                HISTORY & PHYSICAL   HISTORY:  This 60 year old black male who works at [employer] saw  me in April with some bladder outlet obstructive symptoms.  He had a PSA of  14.53.  He had no family history of prostate cancer.  He subsequently  underwent prostate ultrasound and biopsy and was found to have a Gleason's 7  (3 + 4 in 40% of the left lobe and atypia in the right lobe).  Because of  the high PSA and the Gleason grade of 7, he underwent a CT scan which showed  a simple cyst to the right kidney and other small bilateral renal cysts, no  evidence of metastatic disease and a bone scan showed no evidence of  metastatic disease.  He was counselled about therapeutic options and opted  for radical retropubic prostatectomy.   PAST HISTORY:  Diabetes, rheumatoid arthritis and hypertension.  He also has  hypercholesterolemia.  He was cleared with a Cardiolite by the family  practice service at [facility].   PAST SURGICAL HISTORY:  Negative.   SOCIAL HISTORY:  He quit smoking 33 years ago, he does not drink alcohol.   CURRENT MEDICATIONS:  1. Glucophage 1000 mg b.i.d.  2. Lipitor 10 mg daily.  3. Lotensin/HCTZ 20/12.5 mg daily.  4. Prednisone 5 mg daily.  5. Azathioprine 100 mg b.i.d.   ALLERGIES:  DRUG ALLERGIES DENIED.   FAMILY HISTORY:  Noncontributory.   REVIEW OF SYSTEMS:  Noted on health history form.   PHYSICAL EXAMINATION:  VITAL SIGNS:  Blood pressure 135/81, pulse 79,  temperature 97.4.  GENERAL:  He is a heavy set black male in no acute distress.  CHEST:  Clear.  HEART:  Normal.  ABDOMEN:  Soft and nontender.  EXTERNAL GENITALIA:  Unremarkable.  Prostate felt small with no nodules.   IMPRESSION:  1. Prostatic carcinoma.  2. Diabetes mellitus.  3. Rheumatoid arthritis.  4. Hypertension.  5. Hypercholesterolemia.                                               Maretta Bees. Vonita Moss, M.D.    LJP/MEDQ  D:  12/31/2003  T:  12/31/2003  Job:  [id number]

## 2010-12-30 ENCOUNTER — Encounter: Payer: Self-pay | Admitting: Family Medicine

## 2010-12-30 ENCOUNTER — Ambulatory Visit (INDEPENDENT_AMBULATORY_CARE_PROVIDER_SITE_OTHER): Payer: Commercial Managed Care - PPO | Admitting: Family Medicine

## 2010-12-30 DIAGNOSIS — E785 Hyperlipidemia, unspecified: Secondary | ICD-10-CM

## 2010-12-30 DIAGNOSIS — E119 Type 2 diabetes mellitus without complications: Secondary | ICD-10-CM

## 2010-12-30 DIAGNOSIS — I10 Essential (primary) hypertension: Secondary | ICD-10-CM

## 2010-12-30 NOTE — Patient Instructions (Addendum)
You have lost 23 lbs in the last 11 months. Great job! You will need to have your A1c checked tomorrow.  You need to have your eyes checked once a year and your feet need to be checked once a year.

## 2010-12-30 NOTE — Assessment & Plan Note (Signed)
BP well controlled Plan to check his kidneys.  No problems with the meds.

## 2010-12-31 ENCOUNTER — Other Ambulatory Visit (INDEPENDENT_AMBULATORY_CARE_PROVIDER_SITE_OTHER): Payer: Commercial Managed Care - PPO

## 2010-12-31 DIAGNOSIS — E785 Hyperlipidemia, unspecified: Secondary | ICD-10-CM

## 2010-12-31 DIAGNOSIS — I10 Essential (primary) hypertension: Secondary | ICD-10-CM

## 2010-12-31 DIAGNOSIS — E119 Type 2 diabetes mellitus without complications: Secondary | ICD-10-CM

## 2010-12-31 LAB — POCT GLYCOSYLATED HEMOGLOBIN (HGB A1C): Hemoglobin A1C: 6.2

## 2010-12-31 NOTE — Progress Notes (Signed)
Bmp,flp and a1c done today [facility] Rom[facility] Stigger

## 2011-01-01 ENCOUNTER — Telehealth: Payer: Self-pay | Admitting: *Deleted

## 2011-01-01 LAB — BASIC METABOLIC PANEL
BUN: 14 mg/dL (ref 6–23)
Calcium: 9.6 mg/dL (ref 8.4–10.5)
Potassium: 4.1 mEq/L (ref 3.5–5.3)

## 2011-01-01 LAB — LIPID PANEL
HDL: 37 mg/dL — ABNORMAL LOW (ref >39)
LDL Cholesterol: 67 mg/dL (ref 0–99)
VLDL: 26 mg/dL (ref 0–40)

## 2011-01-01 NOTE — Telephone Encounter (Signed)
LM for patient to call back to inform of below 

## 2011-01-01 NOTE — Progress Notes (Signed)
DM2:PT is doing well, continues to walk in the morning, goes to the gym, losing weight, A1c has been dropping. FInished program at [employer] for employees with diabetes but continues to put the principles into practice.   HTN: well controlled, taking his meds, no side effects or concerns, exercising regularily.   ROS: neg except continued weight loss.   PE:  Gen: seated comfortably on the table.  CV: RRR, no murmur Pulm: CTAB, no wheezing or crackles

## 2011-01-01 NOTE — Assessment & Plan Note (Signed)
Pt is doing well with his DM. He is done with the program he was doing at [facility]. He is walking in his living development, he goes to the gym and he is continuing to work on his diet regularily. His last A1c was 6.0 which was down from 6.3.  Plan to recheck A1c today.

## 2011-01-01 NOTE — Telephone Encounter (Signed)
Message copied by Farrell Ours on Thu Jan 01, 2011  2:08 PM ------      Message from: Jamie Brookes B      Created: Thu Jan 01, 2011  7:32 AM      Regarding: Please call him       Please call this patient and let him know that his A1c has increased from 6.0 to 6.2      No need to change any meds but just keep up his good exercise and diet.       Thanks.             ----- Message -----         From: Bonnie Swaziland         Sent: 12/31/2010   3:29 PM           To: Visual merchandiser

## 2011-01-05 NOTE — Telephone Encounter (Signed)
Informed of below 

## 2011-01-14 ENCOUNTER — Encounter: Payer: Self-pay | Admitting: Sports Medicine

## 2011-02-11 ENCOUNTER — Telehealth: Payer: Self-pay | Admitting: *Deleted

## 2011-02-11 NOTE — Telephone Encounter (Addendum)
Patient states he has tried to contact [facility]  about scheduling a colonoscopy. He had one in 2000 at [facility] but they cannot find any record that he has ever been there or that he had colonoscopy. There is a note in our paper chart from Dr. Luther Parody  from [facility] dated 07/22/1998 stating patient  was   scheduled for colonoscopy. No report is available. Called [facility] and they are checking on this also. They are unable to find record . Patient has appointment here next week and will discuss with Dr. Berline Chough about making this referral for him . He has tried to make appointment on his on ,but since [facility] cannot find his records  they consider him a new patient and require referral.   patient states he is having no problems just knows it is time to have one.

## 2011-02-19 ENCOUNTER — Encounter: Payer: Commercial Managed Care - PPO | Admitting: Sports Medicine

## 2011-03-12 ENCOUNTER — Encounter: Payer: Self-pay | Admitting: Sports Medicine

## 2011-03-12 ENCOUNTER — Ambulatory Visit (INDEPENDENT_AMBULATORY_CARE_PROVIDER_SITE_OTHER): Payer: Commercial Managed Care - PPO | Admitting: Sports Medicine

## 2011-03-12 DIAGNOSIS — M545 Low back pain, unspecified: Secondary | ICD-10-CM

## 2011-03-12 DIAGNOSIS — M069 Rheumatoid arthritis, unspecified: Secondary | ICD-10-CM

## 2011-03-12 DIAGNOSIS — Z1211 Encounter for screening for malignant neoplasm of colon: Secondary | ICD-10-CM

## 2011-03-12 DIAGNOSIS — E781 Pure hyperglyceridemia: Secondary | ICD-10-CM

## 2011-03-12 DIAGNOSIS — I10 Essential (primary) hypertension: Secondary | ICD-10-CM

## 2011-03-12 DIAGNOSIS — Z8546 Personal history of malignant neoplasm of prostate: Secondary | ICD-10-CM

## 2011-03-12 DIAGNOSIS — E119 Type 2 diabetes mellitus without complications: Secondary | ICD-10-CM

## 2011-03-12 MED ORDER — ASPIRIN 81 MG PO CHEW: 81.0000 mg | CHEWABLE_TABLET | ORAL | Status: DC

## 2011-03-12 NOTE — Progress Notes (Signed)
Eric Holloway is here today for a yearly exam and referral to GI for screening Colonoscopy.   He was here last in June to follow up his DM and HTN and was doing well at that time with a HbA1c of 6.2; his BP today is 113/66.  He reports no problems with any of his meds.  He does want to talk today regarding his chronic low back pain that has never fully been addressed and that he describes as right iliolumbar in nature.  He states that it is worse first thing in the morning and is better with some activity.  No changes in bowel or bladder but does have chronic urgency that he sees urology for.  No other red flags.  ROS: Denies hematuria, dysuria, polyuria but + for frequency.  No wt loss; no fatigue; no constipation, no diarrhea.  Otherwise 12 point ROS negative.  PE: General:  Adult male in [facility] in no acute distress.   Heart:  RRR, s1/s2 no murmur Lungs: CTA B Extremities:  Negative straight leg raise; warm well perfused.  No foot lesions.

## 2011-03-12 NOTE — Assessment & Plan Note (Signed)
No changes to his anti-hyperglycemics but will change in ASA dose to MWF 2o/2 to recurrent nose bleeds.

## 2011-03-12 NOTE — Patient Instructions (Signed)
It was nice to meet you today.    I'm glad to hear you are doing so well with your Diabetes and your hypertension.  Keep up the great work.  For your back pain we will refer you to Radiology for plain film x-rays of your back; we will call you with your results.  We will get you a referral to GI for your colonoscopy. Follow up with your urologist and your rheumatologist.  Follow up with [place] in 6 months and please call for an appointment.  If you need [place] in the mean time please give [place] a call.

## 2011-03-12 NOTE — Assessment & Plan Note (Signed)
Consider as source for back pain

## 2011-03-12 NOTE — Assessment & Plan Note (Signed)
Referral to [facility] for screening Colonoscopy.

## 2011-03-12 NOTE — Assessment & Plan Note (Signed)
To see Urology later this month >>> PSA at Urology has been non-revealing in the past Plain film back X-rays for back pain but low suspicion of recurrent CA

## 2011-04-13 ENCOUNTER — Telehealth: Payer: Self-pay | Admitting: Sports Medicine

## 2011-04-13 NOTE — Telephone Encounter (Signed)
Patient has been waiting to hear about a referral for a colonoscopy.

## 2011-04-13 NOTE — Telephone Encounter (Signed)
Spoke to Mr. Norlander and informed him that he can set this up for himself. I gave him the number for [facility] [phone]. Told him to tell them that he needs to schedule a screening colonoscopy and that he is a pt of [facility]. Should he have any problems he can call back and inform us of this. Pt understood and agreed.Loralee Pacas [place]

## 2011-04-17 LAB — URIC ACID: Uric Acid, Serum: 7.5 — ABNORMAL HIGH

## 2011-04-22 ENCOUNTER — Ambulatory Visit (AMBULATORY_SURGERY_CENTER): Payer: 59 | Admitting: *Deleted

## 2011-04-22 VITALS — Ht 75.0 in | Wt 232.7 lb

## 2011-04-22 DIAGNOSIS — Z1211 Encounter for screening for malignant neoplasm of colon: Secondary | ICD-10-CM

## 2011-04-22 MED ORDER — PEG-KCL-NACL-NASULF-NA ASC-C 100 G PO SOLR: ORAL | Status: DC

## 2011-05-06 ENCOUNTER — Encounter: Payer: Self-pay | Admitting: Gastroenterology

## 2011-05-06 ENCOUNTER — Ambulatory Visit (AMBULATORY_SURGERY_CENTER): Payer: 59 | Admitting: Gastroenterology

## 2011-05-06 VITALS — BP 133/80 | HR 67 | Temp 97.3°F | Resp 17 | Ht 75.0 in | Wt 232.0 lb

## 2011-05-06 DIAGNOSIS — D128 Benign neoplasm of rectum: Secondary | ICD-10-CM

## 2011-05-06 DIAGNOSIS — Z1211 Encounter for screening for malignant neoplasm of colon: Secondary | ICD-10-CM

## 2011-05-06 DIAGNOSIS — D129 Benign neoplasm of anus and anal canal: Secondary | ICD-10-CM

## 2011-05-06 DIAGNOSIS — D126 Benign neoplasm of colon, unspecified: Secondary | ICD-10-CM

## 2011-05-06 LAB — GLUCOSE, CAPILLARY
Glucose-Capillary: 103 mg/dL — ABNORMAL HIGH (ref 70–99)
Glucose-Capillary: 88 mg/dL (ref 70–99)

## 2011-05-06 MED ORDER — SODIUM CHLORIDE 0.9 % IV SOLN: 500.0000 mL | INTRAVENOUS | Status: DC

## 2011-05-06 NOTE — Patient Instructions (Signed)
Please refer to the blue and neon green sheets for instructions regarding diet and activity for the rest of today.  Handout on polyps given. Resume previous medications. 

## 2011-05-07 ENCOUNTER — Telehealth: Payer: Self-pay

## 2011-05-07 NOTE — Telephone Encounter (Signed)

## 2011-05-12 ENCOUNTER — Encounter: Payer: Self-pay | Admitting: Gastroenterology

## 2011-05-15 ENCOUNTER — Ambulatory Visit
Admission: RE | Admit: 2011-05-15 | Discharge: 2011-05-15 | Disposition: A | Discharge status: Home / Self Care | Payer: 59 | Source: Ambulatory Visit | Attending: Family Medicine | Admitting: Family Medicine

## 2011-05-15 DIAGNOSIS — M545 Low back pain: Secondary | ICD-10-CM

## 2011-05-18 ENCOUNTER — Other Ambulatory Visit: Payer: Self-pay | Admitting: Family Medicine

## 2011-06-11 ENCOUNTER — Encounter: Payer: Self-pay | Admitting: Sports Medicine

## 2011-07-23 ENCOUNTER — Other Ambulatory Visit: Payer: Self-pay | Admitting: Sports Medicine

## 2011-07-23 ENCOUNTER — Encounter: Payer: Self-pay | Admitting: Family Medicine

## 2011-07-23 ENCOUNTER — Ambulatory Visit (INDEPENDENT_AMBULATORY_CARE_PROVIDER_SITE_OTHER): Payer: 59 | Admitting: Family Medicine

## 2011-07-23 ENCOUNTER — Other Ambulatory Visit: Payer: Self-pay | Admitting: Family Medicine

## 2011-07-23 VITALS — BP 150/70 | HR 84 | Ht 75.0 in | Wt 228.9 lb

## 2011-07-23 DIAGNOSIS — M25579 Pain in unspecified ankle and joints of unspecified foot: Secondary | ICD-10-CM

## 2011-07-23 DIAGNOSIS — E7800 Pure hypercholesterolemia, unspecified: Secondary | ICD-10-CM

## 2011-07-23 DIAGNOSIS — E119 Type 2 diabetes mellitus without complications: Secondary | ICD-10-CM

## 2011-07-23 DIAGNOSIS — M25571 Pain in right ankle and joints of right foot: Secondary | ICD-10-CM

## 2011-07-23 MED ORDER — IBUPROFEN 800 MG PO TABS: 800.0000 mg | ORAL_TABLET | Freq: Three times a day (TID) | ORAL | Status: AC | PRN

## 2011-07-23 NOTE — Telephone Encounter (Signed)
Refill request- Saw Dr. Caviness today 

## 2011-07-23 NOTE — Patient Instructions (Addendum)
Right ankle pain: The cause is not clear.  May be an atypical presentation of gout or a ligment/muscle strain.  Take ibuprofen 800mg  every 8 hours prn.  Will take you out of work tomorrow.  Continue to walk over the weekend.  But do not do heavy activity on ankle.

## 2011-07-23 NOTE — Telephone Encounter (Signed)
Refill request- Saw Dr. Edmonia James today

## 2011-07-26 NOTE — Telephone Encounter (Signed)
Refill request

## 2011-07-27 DIAGNOSIS — M25571 Pain in right ankle and joints of right foot: Secondary | ICD-10-CM | POA: Insufficient documentation

## 2011-07-27 HISTORY — DX: Pain in right ankle and joints of right foot: M25.571

## 2011-07-27 NOTE — Assessment & Plan Note (Addendum)
Etiology of right ankle pain unclear.  No trauma.  No abnormality on exam except for mild generalized tenderness on exam of right ankle.  Although not on problem list pt does endorse h/o gout.  Presentation not typical for gout but is on differential.  Pain could be coming from tendon injury since pain is present in the posterior medial malleolus area. No imaging at this time since symptoms are mild and no point tenderness.  Pt to take motrin prn.  Pt to stop wearing ankle brace since pain seemed to start after he began wearing this--could be causing pain or making pain worse if ill fitting. Pt to return for recheck in 2 weeks.  Or sooner if any new or worsening of symptoms.

## 2011-07-27 NOTE — Progress Notes (Signed)
  Subjective:    Patient ID: Eric Holloway, male    DOB: Dec 03, 1950, 61 y.o.   MRN: [id number]  HPI Right ankle pain: Since yesterday, pain in medial right ankle, no known injury, started hurting while at work, walking makes it worse, rest/getting off of it makes it better, no redness of ankle, no obvious swelling.  No warmth of joint.  No fever.  Has problems with pain off and on in arch of right foot.  Has been wearing ankle brace for this reason.  No pain in the arch today.  Pt states he hasn't tried anything over the counter yet for pain.  States he has a history of gout.    Review of Systems As per above    Objective:   Physical Exam  Constitutional: He appears well-developed and well-nourished.  Cardiovascular: Normal rate.   Pulmonary/Chest: Effort normal.  Musculoskeletal:       Right ankle: Full rom, ligaments wnl, no redness, no swelling,  + tenderness in area of medial ankle- area posterior to medial malleolus,  but no point tenderness.  No changes in sensation.  + pedal pulse.  Good cap refill.  Mild antalgic gait change.   Skin: No rash noted.          Assessment & Plan:

## 2011-09-12 ENCOUNTER — Encounter (HOSPITAL_COMMUNITY): Payer: Self-pay | Admitting: Nurse Practitioner

## 2011-09-12 ENCOUNTER — Emergency Department (HOSPITAL_COMMUNITY)
Admission: EM | Admit: 2011-09-12 | Discharge: 2011-09-12 | Disposition: A | Discharge status: Home / Self Care | Payer: 59 | Attending: Emergency Medicine | Admitting: Emergency Medicine

## 2011-09-12 DIAGNOSIS — I10 Essential (primary) hypertension: Secondary | ICD-10-CM | POA: Insufficient documentation

## 2011-09-12 DIAGNOSIS — M129 Arthropathy, unspecified: Secondary | ICD-10-CM | POA: Insufficient documentation

## 2011-09-12 DIAGNOSIS — Y9389 Activity, other specified: Secondary | ICD-10-CM | POA: Insufficient documentation

## 2011-09-12 DIAGNOSIS — Y998 Other external cause status: Secondary | ICD-10-CM | POA: Insufficient documentation

## 2011-09-12 DIAGNOSIS — IMO0002 Reserved for concepts with insufficient information to code with codable children: Secondary | ICD-10-CM | POA: Insufficient documentation

## 2011-09-12 DIAGNOSIS — R296 Repeated falls: Secondary | ICD-10-CM | POA: Insufficient documentation

## 2011-09-12 DIAGNOSIS — M549 Dorsalgia, unspecified: Secondary | ICD-10-CM

## 2011-09-12 DIAGNOSIS — E119 Type 2 diabetes mellitus without complications: Secondary | ICD-10-CM | POA: Insufficient documentation

## 2011-09-12 DIAGNOSIS — Z8546 Personal history of malignant neoplasm of prostate: Secondary | ICD-10-CM | POA: Insufficient documentation

## 2011-09-12 DIAGNOSIS — Z87891 Personal history of nicotine dependence: Secondary | ICD-10-CM | POA: Insufficient documentation

## 2011-09-12 NOTE — ED Notes (Signed)
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 Also L knee pain. Hurts to breathe. Denies sob

## 2011-09-12 NOTE — ED Provider Notes (Signed)
History     CSN: [id number]  Arrival date & time 09/12/11  1237   First MD Initiated Contact with Patient 09/12/11 1332      Chief Complaint  Patient presents with  . Fall    (Consider location/radiation/quality/duration/timing/severity/associated sxs/prior treatment) HPI The patient presents with pain about his right lower back.  He notes that just prior to arrival he had a full, while resting something.  He struck his right lower back against a pew.  Since that time the patient has had pain persistently in the right lower back.  The pain is worse with motion, is sharp.  No attempts at OTC medication use thus far.  No distal dysesthesia or weakness.  No loss of bowel or bladder control.  No abdominal pain, no vomiting, no diarrhea. He notes that he has been prostate cancer free for several years, and his cancer was not metastatic to bone.  Past Medical History  Diagnosis Date  . Diabetes mellitus   . Hypertension   . Arthritis   . Prostate cancer 2005  . Ulcer 1972    Past Surgical History  Procedure Date  . Prostate surgery 2005    unable to remove; chemo and radiation    Family History  Problem Relation Age of Onset  . Stomach cancer Maternal Uncle   . Colon cancer Neg Hx     History  Substance Use Topics  . Smoking status: Former Games developer  . Smokeless tobacco: Not on file  . Alcohol Use: No      Review of Systems  All other systems reviewed and are negative.    Allergies  Review of patient's allergies indicates no known allergies.  Home Medications   Current Outpatient Rx  Name Route Sig Dispense Refill  . IRON 240 (27 FE) MG PO TABS Oral Take 1 tablet by mouth daily.     Marland Kitchen FEXOFENADINE HCL 60 MG PO TABS Oral Take 60 mg by mouth 2 (two) times daily.      Marland Kitchen FOLIC ACID 400 MCG PO TABS Oral Take 400 mcg by mouth 2 (two) times daily.      Marland Kitchen LISINOPRIL-HYDROCHLOROTHIAZIDE 20-25 MG PO TABS Oral Take 1 tablet by mouth daily.    Marland Kitchen METFORMIN HCL 1000 MG PO  TABS Oral Take 1,000 mg by mouth 2 (two) times daily with a meal.    . METHOTREXATE 2.5 MG PO TABS Oral Take 10 mg by mouth 2 (two) times a week. Mondays and tuesdays    . ONE-DAILY MULTI VITAMINS PO TABS Oral Take 1 tablet by mouth daily.      Marland Kitchen PREDNISONE 10 MG PO TABS Oral Take 10 mg by mouth daily.     Marland Kitchen SIMVASTATIN 40 MG PO TABS Oral Take 40 mg by mouth every evening.    . ALCOHOL PREPS PADS Does not apply by Does not apply route.      Marland Kitchen GLUCOSE BLOOD VI STRP Other 1 each by Other route as needed. Check sugars 1-2 times daily     . LANCETS MISC Does not apply by Does not apply route.      Marland Kitchen SILDENAFIL CITRATE 100 MG PO TABS Oral Take 100 mg by mouth. Take one hour prior to intercourse       BP 141/76  Pulse 71  Temp(Src) 98.9 F (37.2 C) (Oral)  Resp 20  Ht 6\' 3"  (1.905 m)  Wt 229 lb (103.874 kg)  BMI 28.62 kg/m2  SpO2 100%  Physical Exam  Nursing note and vitals reviewed. Constitutional: He appears well-developed and well-nourished.  HENT:  Head: Normocephalic and atraumatic.  Eyes: Conjunctivae and EOM are normal.  Cardiovascular: Normal rate and regular rhythm.   Pulmonary/Chest: Effort normal. No stridor. No respiratory distress.  Musculoskeletal:       Back:       Patient has 5/5 strength in both lower extremities from hips, knees. The pelvis is stable and there is no appreciable deformity on palpation of the bony structures.    ED Course  Procedures (including critical care time)  Labs Reviewed - No data to display No results found.   No diagnosis found.    MDM  This generally well male presents following a minor trauma with right lower back pain.  On exam he is in no distress with unremarkable vital signs.  The patient has 5/5 strength in both lower extremities and in no appreciable deformity on exam.  The patient's denial of any new neurologic loss, is preserved into the tumor status, the preserved strength symmetrically is all reassuring.  I discussed the  utility of x-rays of the patient, who defers x-rays and will follow up with his primary care physician.  He was advised on analgesics, rest and discharged in stable condition.      Gerhard Munch, MD 09/12/11 1356

## 2011-09-12 NOTE — Discharge Instructions (Signed)
Back Pain, Adult Low back pain is very common. About 1 in 5 people have back pain.The cause of low back pain is rarely dangerous. The pain often gets better over time.About half of people with a sudden onset of back pain feel better in just 2 weeks. About 8 in 10 people feel better by 6 weeks.  CAUSES Some common causes of back pain include:  Strain of the muscles or ligaments supporting the spine.   Wear and tear (degeneration) of the spinal discs.   Arthritis.   Direct injury to the back.  DIAGNOSIS Most of the time, the direct cause of low back pain is not known.However, back pain can be treated effectively even when the exact cause of the pain is unknown.Answering your caregiver's questions about your overall health and symptoms is one of the most accurate ways to make sure the cause of your pain is not dangerous. If your caregiver needs more information, he or she may order lab work or imaging tests (X-rays or MRIs).However, even if imaging tests show changes in your back, this usually does not require surgery. HOME CARE INSTRUCTIONS For many people, back pain returns.Since low back pain is rarely dangerous, it is often a condition that people can learn to manageon their own.   Remain active. It is stressful on the back to sit or stand in one place. Do not sit, drive, or stand in one place for more than 30 minutes at a time. Take short walks on level surfaces as soon as pain allows.Try to increase the length of time you walk each day.   Do not stay in bed.Resting more than 1 or 2 days can delay your recovery.   Do not avoid exercise or work.Your body is made to move.It is not dangerous to be active, even though your back may hurt.Your back will likely heal faster if you return to being active before your pain is gone.   Pay attention to your body when you bend and lift. Many people have less discomfortwhen lifting if they bend their knees, keep the load close to their  bodies,and avoid twisting. Often, the most comfortable positions are those that put less stress on your recovering back.   Find a comfortable position to sleep. Use a firm mattress and lie on your side with your knees slightly bent. If you lie on your back, put a pillow under your knees.   Only take over-the-counter or prescription medicines as directed by your caregiver. Over-the-counter medicines to reduce pain and inflammation are often the most helpful.Your caregiver may prescribe muscle relaxant drugs.These medicines help dull your pain so you can more quickly return to your normal activities and healthy exercise.   Put ice on the injured area.   Put ice in a plastic bag.   Place a towel between your skin and the bag.   Leave the ice on for 15 to 20 minutes, 3 to 4 times a day for the first 2 to 3 days. After that, ice and heat may be alternated to reduce pain and spasms.   Ask your caregiver about trying back exercises and gentle massage. This may be of some benefit.   Avoid feeling anxious or stressed.Stress increases muscle tension and can worsen back pain.It is important to recognize when you are anxious or stressed and learn ways to manage it.Exercise is a great option.  SEEK MEDICAL CARE IF:  You have pain that is not relieved with rest or medicine.   You have   pain that does not improve in 1 week.   You have new symptoms.   You are generally not feeling well.  SEEK IMMEDIATE MEDICAL CARE IF:   You have pain that radiates from your back into your legs.   You develop new bowel or bladder control problems.   You have unusual weakness or numbness in your arms or legs.   You develop nausea or vomiting.   You develop abdominal pain.   You feel faint.  Document Released: 06/22/2005 Document Revised: 06/11/2011 Document Reviewed: 11/10/2010 ExitCare Patient Information 2012 [employer]. 

## 2011-09-15 ENCOUNTER — Ambulatory Visit: Payer: 59 | Admitting: Sports Medicine

## 2011-09-17 ENCOUNTER — Ambulatory Visit (INDEPENDENT_AMBULATORY_CARE_PROVIDER_SITE_OTHER): Payer: 59 | Admitting: Family Medicine

## 2011-09-17 ENCOUNTER — Encounter: Payer: Self-pay | Admitting: Family Medicine

## 2011-09-17 VITALS — BP 149/93 | HR 73 | Temp 98.4°F | Ht 75.0 in | Wt 233.0 lb

## 2011-09-17 DIAGNOSIS — S301XXA Contusion of abdominal wall, initial encounter: Secondary | ICD-10-CM

## 2011-09-17 DIAGNOSIS — S20229A Contusion of unspecified back wall of thorax, initial encounter: Secondary | ICD-10-CM

## 2011-09-17 DIAGNOSIS — M545 Low back pain: Secondary | ICD-10-CM

## 2011-09-17 HISTORY — DX: Contusion of unspecified back wall of thorax, initial encounter: S20.229A

## 2011-09-17 HISTORY — DX: Contusion of abdominal wall, initial encounter: S30.1XXA

## 2011-09-17 MED ORDER — MELOXICAM 15 MG PO TABS: 15.0000 mg | ORAL_TABLET | Freq: Every day | ORAL | Status: DC

## 2011-09-17 NOTE — Assessment & Plan Note (Signed)
Overall no change but continuing to have some limitation in range of motion as well as pain. He is agreeable to physical therapy at this time. I will put that referral in.

## 2011-09-17 NOTE — Patient Instructions (Signed)
I am sorry that you were in so much pain today We will send in a referral for physical therapy Please take the Mobic just once a day Do not take any Advil, Motrin, ibuprofen, Aleve while you're taking it These medicines are too similar to take together  Please come back if the pain gets worse and come back to see your regular doctor as scheduled

## 2011-09-17 NOTE — Progress Notes (Signed)
  Subjective:    Patient ID: Eric Holloway, male    DOB: 1951-01-05, 61 y.o.   MRN: [id number]  HPI Patient comes in today to followup for ED visit. He has some back pain after he fell on Saturday. He does not usually have trouble with balance or dizziness. He was moving furniture at [employer] and tripped over the pew. He has less pain than he did on Saturday and is better when he takes high-dose Motrin. He is not having trouble with weakness or urination changes. He also has chronic back pain that has been troubling him. This is about the same as it usually is.   Review of Systems Denies CP, SOB, HA, N/V/D, fever     Objective:   Physical Exam Vital signs reviewed General appearance - alert, well appearing, and in no distress and oriented to person, place, and time MSK-patient with some limitation in range of motion laterally but with good forward and backward lean. Patient is mildly tender to palpation over the right flank area. There is no ecchymosis. Patient with normal gait and strength. Able to get on and off exam table and out of chair easily.      Assessment & Plan:

## 2011-09-17 NOTE — Assessment & Plan Note (Signed)
Improving area of contusion on flank. Will treat with Mobic x7-10 more days. Gave note for work that if he could not have light duty he would be out of work until Wednesday. He is to followup on Wednesday if he needs a further note.

## 2011-09-21 ENCOUNTER — Telehealth: Payer: Self-pay | Admitting: Sports Medicine

## 2011-09-21 NOTE — Telephone Encounter (Signed)
FMLA paperwork to be completed by Eric Holloway.   

## 2011-09-24 ENCOUNTER — Telehealth: Payer: Self-pay | Admitting: Sports Medicine

## 2011-09-24 NOTE — Telephone Encounter (Signed)
FMLA paperwork to be completed by Berline Chough.

## 2011-09-24 NOTE — Telephone Encounter (Signed)
FMLA forms placed in Dr. Rigby's box for completion.  Aldina Porta Simpson  

## 2011-10-08 ENCOUNTER — Other Ambulatory Visit: Payer: Self-pay | Admitting: Family Medicine

## 2011-10-08 ENCOUNTER — Other Ambulatory Visit: Payer: Self-pay | Admitting: *Deleted

## 2011-10-08 DIAGNOSIS — E119 Type 2 diabetes mellitus without complications: Secondary | ICD-10-CM

## 2011-10-08 MED ORDER — GNP SUPER THIN LANCETS 30G MISC: 1.0000 | Freq: Once | Status: DC

## 2011-10-12 ENCOUNTER — Other Ambulatory Visit (HOSPITAL_COMMUNITY): Payer: Self-pay | Admitting: Orthopedic Surgery

## 2011-10-12 DIAGNOSIS — M545 Low back pain: Secondary | ICD-10-CM

## 2011-10-14 ENCOUNTER — Ambulatory Visit (HOSPITAL_COMMUNITY)
Admission: RE | Admit: 2011-10-14 | Discharge: 2011-10-14 | Disposition: A | Discharge status: Home / Self Care | Payer: 59 | Source: Ambulatory Visit | Attending: Orthopedic Surgery | Admitting: Orthopedic Surgery

## 2011-10-14 DIAGNOSIS — M545 Low back pain, unspecified: Secondary | ICD-10-CM | POA: Insufficient documentation

## 2011-10-14 DIAGNOSIS — M51379 Other intervertebral disc degeneration, lumbosacral region without mention of lumbar back pain or lower extremity pain: Secondary | ICD-10-CM | POA: Insufficient documentation

## 2011-10-14 DIAGNOSIS — M5137 Other intervertebral disc degeneration, lumbosacral region: Secondary | ICD-10-CM | POA: Insufficient documentation

## 2011-11-02 ENCOUNTER — Other Ambulatory Visit: Payer: Self-pay | Admitting: Sports Medicine

## 2011-11-17 ENCOUNTER — Other Ambulatory Visit: Payer: Self-pay | Admitting: Family Medicine

## 2012-01-06 ENCOUNTER — Encounter: Payer: Self-pay | Admitting: Sports Medicine

## 2012-01-06 ENCOUNTER — Ambulatory Visit (INDEPENDENT_AMBULATORY_CARE_PROVIDER_SITE_OTHER): Payer: 59 | Admitting: Sports Medicine

## 2012-01-06 VITALS — BP 120/72 | HR 72 | Temp 97.7°F | Ht 74.0 in | Wt 238.4 lb

## 2012-01-06 DIAGNOSIS — I10 Essential (primary) hypertension: Secondary | ICD-10-CM

## 2012-01-06 DIAGNOSIS — M069 Rheumatoid arthritis, unspecified: Secondary | ICD-10-CM

## 2012-01-06 DIAGNOSIS — B351 Tinea unguium: Secondary | ICD-10-CM

## 2012-01-06 DIAGNOSIS — E785 Hyperlipidemia, unspecified: Secondary | ICD-10-CM

## 2012-01-06 DIAGNOSIS — E119 Type 2 diabetes mellitus without complications: Secondary | ICD-10-CM

## 2012-01-06 LAB — BASIC METABOLIC PANEL
BUN: 15 mg/dL (ref 6–23)
CO2: 30 mEq/L (ref 19–32)
Chloride: 103 mEq/L (ref 96–112)
Creat: 1.03 mg/dL (ref 0.50–1.35)
Potassium: 4.4 mEq/L (ref 3.5–5.3)

## 2012-01-06 LAB — CBC
HCT: 40.1 % (ref 39.0–52.0)
Hemoglobin: 13 g/dL (ref 13.0–17.0)
MCHC: 32.4 g/dL (ref 30.0–36.0)
RDW: 14.9 % (ref 11.5–15.5)
WBC: 9.6 10*3/uL (ref 4.0–10.5)

## 2012-01-06 LAB — LIPID PANEL
HDL: 36 mg/dL — ABNORMAL LOW (ref >39)
LDL Cholesterol: 92 mg/dL (ref 0–99)
Triglycerides: 155 mg/dL — ABNORMAL HIGH (ref <150)
VLDL: 31 mg/dL (ref 0–40)

## 2012-01-06 MED ORDER — ASPIRIN 81 MG PO TBEC: DELAYED_RELEASE_TABLET | ORAL | Status: DC

## 2012-01-06 NOTE — Patient Instructions (Addendum)
It was great to see you.  Make the changes we discussed regarding your diet and exercises.  I know you can do it.  Work on drinking more water as we discussed.  Add the asprin on Sunday and Wednesday every week. Remember to paint your toenails every Sunday with superglue as we discussed.  I'll see you back in 6 months unless you need [place] sooner.

## 2012-01-06 NOTE — Progress Notes (Signed)
  [facility]  Patient name: Eric Holloway MRN [id number]  Date of birth: 1950/11/20  CC & HPI  Eric Holloway is a 61 y.o. male presenting today for follow-up of diabetes.  DIABETES: medication compliance: compliant all of the time, diabetic diet compliance: compliant most of the time, home glucose monitoring: is performed sporadically, fasting values range 1teens. No lows, further diabetic ROS: no chest pain, dyspnea or TIA's, no numbness, tingling or pain in extremities, no hypoglycemia Notices some dizziness over that past 5-6 weeks.  Worse with work when on his feet all day. Reports not drinking water on regular basis.   Some polyuria but no polydypsia.   Hypertension - chronic problem, well controlled.   Pt reports no chest pain, no dyspnea on exertion, no orthopnea, no peripheral edema, no TIAs,  New Concerns: None   ROS  See hpi  Pertinent History Reviewed  Medical & Surgical Hx:  Reviewed: Significant for HTN, HLD, DM, Hx Prostate CA,  Medications: Reviewed & Updated - see associated section Social History: Reviewed - Significant for non smoker   Objective Findings  Vitals:  Filed Vitals:   01/06/12 1013  BP: 120/72  Pulse: 72  Temp: 97.7 F (36.5 C)   PE: GENERAL:  Adult AA male.  Examined in [facility].  In no discomfort; norespiratory distress.   PSYCH: Alert and appropriately interactive; Insight:Good   H&N: AT/[place], MMM, no scleral icterus, EOMi THORAX: HEART: RRR, S1/S2 heard, no murmur LUNGS: CTA B, no wheezes, no crackles EXTREMITIES: Moves all 4 extremities spontaneously, warm well perfused, 0 edema, bilateral DP and PT pulses 2/4.    diabetic foot exam: No evidence of lesions, onychomycosis noted, no calluses, sensation intact to monofilament testing throughout

## 2012-01-09 ENCOUNTER — Encounter: Payer: Self-pay | Admitting: Sports Medicine

## 2012-01-09 NOTE — Assessment & Plan Note (Signed)
Instructed on q weekly application of cyanoacrylate

## 2012-01-09 NOTE — Assessment & Plan Note (Signed)
Patient has fasted today.  Will collect fasting labs

## 2012-01-09 NOTE — Assessment & Plan Note (Signed)
No changes at this time.

## 2012-01-09 NOTE — Assessment & Plan Note (Signed)
Stable/Well Controlled - no changes at this time. Continue home diet and exercise changes

## 2012-01-09 NOTE — Assessment & Plan Note (Addendum)
Patient reports that his activity level and compliance with his diet have decreased.  He is very confident that he can make the changes that are appropriate to get his A1c level back down.  Patient continued to have nosebleeds with prior aspirin regimen.  Will attempt twice weekly dosing as better than nothing at this time.

## 2012-01-25 ENCOUNTER — Encounter: Payer: Self-pay | Admitting: Sports Medicine

## 2012-02-01 ENCOUNTER — Other Ambulatory Visit: Payer: Self-pay | Admitting: Sports Medicine

## 2012-02-08 ENCOUNTER — Telehealth: Payer: Self-pay | Admitting: Sports Medicine

## 2012-02-08 NOTE — Telephone Encounter (Signed)
Patient is calling to get a refill on his Viagra sent to [facility].

## 2012-02-10 MED ORDER — SILDENAFIL CITRATE 100 MG PO TABS: 100.0000 mg | ORAL_TABLET | ORAL | Status: DC | PRN

## 2012-02-10 NOTE — Telephone Encounter (Signed)
completed

## 2013-02-21 ENCOUNTER — Telehealth: Payer: Self-pay | Admitting: Sports Medicine

## 2013-02-21 NOTE — Telephone Encounter (Signed)
Left VM "Rakin Lemelle" to have him call back to make appt to have A1C checked

## 2013-05-30 ENCOUNTER — Ambulatory Visit: Payer: Self-pay | Admitting: Sports Medicine

## 2013-06-12 ENCOUNTER — Ambulatory Visit (INDEPENDENT_AMBULATORY_CARE_PROVIDER_SITE_OTHER): Payer: BC Managed Care – PPO | Admitting: Sports Medicine

## 2013-06-12 ENCOUNTER — Encounter: Payer: Self-pay | Admitting: Sports Medicine

## 2013-06-12 VITALS — BP 127/66 | HR 66 | Temp 98.4°F | Ht 75.0 in | Wt 259.1 lb

## 2013-06-12 DIAGNOSIS — E119 Type 2 diabetes mellitus without complications: Secondary | ICD-10-CM

## 2013-06-12 DIAGNOSIS — E781 Pure hyperglyceridemia: Secondary | ICD-10-CM

## 2013-06-12 DIAGNOSIS — Z299 Encounter for prophylactic measures, unspecified: Secondary | ICD-10-CM

## 2013-06-12 DIAGNOSIS — E669 Obesity, unspecified: Secondary | ICD-10-CM

## 2013-06-12 DIAGNOSIS — Z8546 Personal history of malignant neoplasm of prostate: Secondary | ICD-10-CM

## 2013-06-12 DIAGNOSIS — M069 Rheumatoid arthritis, unspecified: Secondary | ICD-10-CM

## 2013-06-12 DIAGNOSIS — I10 Essential (primary) hypertension: Secondary | ICD-10-CM

## 2013-06-12 LAB — POCT GLYCOSYLATED HEMOGLOBIN (HGB A1C): Hemoglobin A1C: 6.4

## 2013-06-12 MED ORDER — PREDNISONE 5 MG PO TABS: 5.0000 mg | ORAL_TABLET | Freq: Every day | ORAL | Status: DC

## 2013-06-12 MED ORDER — FOLIC ACID 1 MG PO TABS: 1.0000 mg | ORAL_TABLET | Freq: Every day | ORAL | Status: DC

## 2013-06-12 MED ORDER — LISINOPRIL-HYDROCHLOROTHIAZIDE 20-25 MG PO TABS: 1.0000 | ORAL_TABLET | Freq: Every day | ORAL | Status: DC

## 2013-06-12 MED ORDER — TAMSULOSIN HCL 0.4 MG PO CAPS: 0.4000 mg | ORAL_CAPSULE | Freq: Every day | ORAL | Status: DC

## 2013-06-12 MED ORDER — METFORMIN HCL 1000 MG PO TABS: 1000.0000 mg | ORAL_TABLET | Freq: Two times a day (BID) | ORAL | Status: DC

## 2013-06-12 NOTE — Assessment & Plan Note (Signed)
Encouraged to followup with urology; continues to have significant Nocturia. On Terazosin; reported last PSA WNL.  (requesting records from [place])

## 2013-06-12 NOTE — Assessment & Plan Note (Signed)
Significantly better control. Her choosing wisely recommendations encouraged to stop checking CBGs on regular basis. Encouraged to continue TLC (Therapeutic Lifestyle Changes)

## 2013-06-12 NOTE — Assessment & Plan Note (Signed)
On methotrexate and prednisone daily.  We'll provide prescription for chronic 5 mg dose or [facility] for financial reasons, followed by [place].

## 2013-06-12 NOTE — Assessment & Plan Note (Signed)
Encouraged daily activity

## 2013-06-12 NOTE — Assessment & Plan Note (Signed)
Currently stable. No change in functional status.

## 2013-06-12 NOTE — Progress Notes (Signed)
Eric Holloway - 62 y.o. male MRN [id number]  Date of birth: 18-May-1951  CC, HPI, Interval History & ROS  [name] is here today to followup on his chronic medical conditions including:  DM, HLD, Obesity, HTN, RA  He reports overall doing well and has been followed by the [place] since seeing me last.  Changed due to insurance.  Now working again with [employer].  Wants rx for less expensive medications off 4$ list.  Reports all labs have recently been monitored at [facility] including PSA and basic labs.  Reports occasional orthostatic symptoms while at work and dehydrated.  Pt denies chest pain, palpitations, shortness of breath/DOE, orthopnea/PND, LE swelling.   Pt denies hypoglycemic symptoms/episodes.  No reported polyuria/polydipsia. Pt is compliant with foot exams and denies any new foot lesions or new sensory changes/dysesthesias.  Patient denies any facial asymmetry, unilateral weakness, or dysarthria.  He continues to have significant nocturia and was restarted on terazosin.  Some improvement in urinary symptoms but seems to have worsened his orthostasis.  Reports compliance with TLC (Therapeutic Lifestyle Changes) with the exception of formal daily exercise.  Reports being highly active at work.  Other acute problems include: None  Pertinent History & Care Coordination  No Patient Care Coordination Note on file.  History  Smoking status  . Former Smoker  Smokeless tobacco  . Not on file   Health Maintenance Due  Topic  . Tetanus/tdap   . Zostavax     Recent Labs  06/12/13 0855  HGBA1C 6.4     Otherwise past Medical, Surgical, Social, and Family History Reviewed per EMR Medications and Allergies reviewed and all updated if necessary. Objective Findings  VITALS: HR: 66 bpm  BP: 127/66 mmHg  TEMP: 98.4 F (36.9 C) (Oral)  RESP:    HT: 6\' 3"  (190.5 cm)  WT: 259 lb 1.6 oz (117.527 kg)  BMI: 32.5   BP Readings from Last 3 Encounters:  06/12/13 127/66    01/06/12 120/72  09/17/11 149/93   Wt Readings from Last 3 Encounters:  06/12/13 259 lb 1.6 oz (117.527 kg)  01/06/12 238 lb 6.4 oz (108.138 kg)  09/17/11 233 lb (105.688 kg)     PHYSICAL EXAM: GENERAL:  adult [place] American male. In no discomfort; no respiratory distress  PSYCH: alert and appropriate, good insight   HNEENT:   CARDIO: RRR, S1/S2 heard, no murmur  LUNGS: CTA B, no wheezes, no crackles  ABDOMEN:   EXTREM:  Warm, well perfused.  Moves all 4 extremities spontaneously; no lateralization.  No noted foot lesions.  Sensation is intact to monofilament testing in bilateral lower extremities.  Distal pulses 1+/4.  No pretibial edema.  GU:   SKIN:      Assessment & Plan   Problems addressed today: General Plan & Pt Instructions:  1. DIABETES MELLITUS, TYPE II   2. HYPERTRIGLYCERIDEMIA   3. OBESITY   4. HYPERTENSION, BENIGN SYSTEMIC   5. 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 PROSTATE CANCER, HX OF   7. Preventive measure       I Will send in new [facility] for you.    Stop checking your sugars at home.    Try doing a dedicated amount of time for exercise daily.    Followup with urology   Ask about Tetanus/TDAP and Zostavax at the [facility]  Ask about changing from simvastatin to Lipitor or Crestor.    For further discussion of A/P and for follow up issues see problem  based charting.

## 2013-06-12 NOTE — Assessment & Plan Note (Signed)
Currently on simvastatin 80, encouraged to change to Lipitor or Crestor if able 2 through the [place].

## 2013-06-12 NOTE — Patient Instructions (Signed)
   I Will send in new prescriptions to [facility] for you.    Stop checking your sugars at home.    Try doing a dedicated amount of time for exercise daily.    Followup with urology   Ask about Tetanus/TDAP and Zostavax at the [facility]  Ask about changing from simvastatin to Lipitor or Crestor.   If you need anything prior to your next visit please call the clinic. Please Bring all medications or accurate medication list with you to each appointment; an accurate medication list is essential in providing you the best care possible.

## 2013-06-16 ENCOUNTER — Telehealth: Payer: Self-pay | Admitting: Sports Medicine

## 2013-06-16 ENCOUNTER — Other Ambulatory Visit: Payer: Self-pay | Admitting: Sports Medicine

## 2013-06-16 MED ORDER — TRAMADOL HCL 50 MG PO TABS
50.0000 mg | ORAL_TABLET | Freq: Three times a day (TID) | ORAL | Status: DC | PRN
Start: 2013-06-16 — End: 2014-08-09

## 2013-06-16 MED ORDER — TAMSULOSIN HCL 0.4 MG PO CAPS: 0.4000 mg | ORAL_CAPSULE | Freq: Every day | ORAL | Status: DC

## 2013-06-16 MED ORDER — DICLOFENAC SODIUM 75 MG PO TBEC: 75.0000 mg | DELAYED_RELEASE_TABLET | Freq: Two times a day (BID) | ORAL | Status: DC

## 2013-06-16 MED ORDER — PANTOPRAZOLE SODIUM 40 MG PO TBEC: 40.0000 mg | DELAYED_RELEASE_TABLET | Freq: Every day | ORAL | Status: DC

## 2013-06-16 MED ORDER — LISINOPRIL-HYDROCHLOROTHIAZIDE 20-25 MG PO TABS: 1.0000 | ORAL_TABLET | Freq: Every day | ORAL | Status: DC

## 2013-06-16 MED ORDER — METFORMIN HCL 1000 MG PO TABS: 1000.0000 mg | ORAL_TABLET | Freq: Two times a day (BID) | ORAL | Status: DC

## 2013-06-16 MED ORDER — DIAZEPAM 5 MG PO TABS: 5.0000 mg | ORAL_TABLET | Freq: Two times a day (BID) | ORAL | Status: DC | PRN

## 2013-06-16 MED ORDER — SIMVASTATIN 80 MG PO TABS: 80.0000 mg | ORAL_TABLET | Freq: Every day | ORAL | Status: DC

## 2013-06-16 MED ORDER — PREDNISONE 5 MG PO TABS: 5.0000 mg | ORAL_TABLET | Freq: Every day | ORAL | Status: DC

## 2013-06-16 MED ORDER — FOLIC ACID 1 MG PO TABS: 1.0000 mg | ORAL_TABLET | Freq: Every day | ORAL | Status: DC

## 2013-06-16 NOTE — Telephone Encounter (Signed)
Patient that all meds that were sent in on 12/8 were suppose to be sent to [facility]. Plus refill request for lisinopril and metformin.

## 2013-06-16 NOTE — Telephone Encounter (Signed)
Resent rx into [facility] [address]. Patient notified

## 2013-07-03 ENCOUNTER — Encounter: Payer: Self-pay | Admitting: Sports Medicine

## 2013-07-20 ENCOUNTER — Telehealth: Payer: Self-pay | Admitting: Sports Medicine

## 2013-07-20 NOTE — Telephone Encounter (Signed)
Wants to change from simavastatin to lipitor Please advise

## 2013-07-21 MED ORDER — ATORVASTATIN CALCIUM 40 MG PO TABS: 40.0000 mg | ORAL_TABLET | Freq: Every day | ORAL | Status: DC

## 2013-07-21 NOTE — Telephone Encounter (Signed)
Spoke with patient and informed him of below 

## 2013-07-21 NOTE — Telephone Encounter (Signed)
Okay to change.  Rx for Lipitor 40mg  sent to [facility].  Please call and inform.  Gerda Diss, [facility] Resident - PGY-3 07/21/2013 2:40 PM

## 2013-10-05 ENCOUNTER — Ambulatory Visit: Payer: BC Managed Care – PPO

## 2013-12-06 ENCOUNTER — Ambulatory Visit (INDEPENDENT_AMBULATORY_CARE_PROVIDER_SITE_OTHER): Payer: No Typology Code available for payment source | Admitting: Sports Medicine

## 2013-12-06 ENCOUNTER — Encounter: Payer: Self-pay | Admitting: Sports Medicine

## 2013-12-06 VITALS — BP 153/76 | HR 91 | Temp 97.2°F | Ht 75.0 in | Wt 256.6 lb

## 2013-12-06 DIAGNOSIS — E119 Type 2 diabetes mellitus without complications: Secondary | ICD-10-CM

## 2013-12-06 DIAGNOSIS — Z8546 Personal history of malignant neoplasm of prostate: Secondary | ICD-10-CM

## 2013-12-06 DIAGNOSIS — I10 Essential (primary) hypertension: Secondary | ICD-10-CM

## 2013-12-06 DIAGNOSIS — Z299 Encounter for prophylactic measures, unspecified: Secondary | ICD-10-CM

## 2013-12-06 DIAGNOSIS — M069 Rheumatoid arthritis, unspecified: Secondary | ICD-10-CM

## 2013-12-06 LAB — POCT GLYCOSYLATED HEMOGLOBIN (HGB A1C): Hemoglobin A1C: 7.5

## 2013-12-06 MED ORDER — CANAGLIFLOZIN-METFORMIN HCL 150-1000 MG PO TABS: 1.0000 | ORAL_TABLET | Freq: Two times a day (BID) | ORAL | Status: DC

## 2013-12-06 MED ORDER — SILDENAFIL CITRATE 100 MG PO TABS: 100.0000 mg | ORAL_TABLET | ORAL | Status: DC | PRN

## 2013-12-06 MED ORDER — SIMVASTATIN 40 MG PO TABS: 40.0000 mg | ORAL_TABLET | Freq: Every day | ORAL | Status: DC

## 2013-12-06 NOTE — Assessment & Plan Note (Signed)
Chronic condition  - followed by rheumatology.  They are weaning him off of his prednisone.  He should be done in one to 2 weeks. 1. Agree with stopping prednisone. > Patient to call inform us if he restarts his medicine

## 2013-12-06 NOTE — Assessment & Plan Note (Signed)
Chronic condition  - encouraged followup with urologist at [facility]. 1. Check PSA and no records of this.

## 2013-12-06 NOTE — Assessment & Plan Note (Signed)
Chronic condition  - worsening control with A1c 7.4.  This is likely worsened secondary to steroid usage he is weaning off of for his rheumatoid arthritis 1. Discussed TLC (Therapeutic Lifestyle Changes) 2.  start Invokana (Invokamet), should be helpful for blood pressure, weight loss and diabetes.

## 2013-12-06 NOTE — Assessment & Plan Note (Signed)
Chronic condition  - acutely elevated today. 1. Continue combination Prinzide. 2. Starting Invokana should have some additional diuretic and blood pressure effects.

## 2013-12-06 NOTE — Progress Notes (Signed)
  Eric Holloway - 63 y.o. male MRN [id number]  Date of birth: 1950-11-15  CC, SUBJECTIVE & ROS:     If applicable, see problem based charting for additional problem specific documentation. Chief Complaint  Patient presents with  . Medical Management of Chronic Issues    dm, htn,    Patient presents today for management of chronic disease his including hypertension diabetes.  His testis prostate cancer and is followed at the [facility] for his juvenile arthritis.  Last visit last month.  Overall doing well,Pt denies chest pain, dyspnea at rest or exertion, PND, lower extremity edema.Patient denies any facial asymmetry, unilateral weakness, or dysarthria.Pt denies hypoglycemic symptoms/episodes.  No reported polyuria/polydipsia. Pt is compliant with foot exams and denies any new foot lesions or new sensory changes/dysesthesias.  He is compliant with his medications.  He is changed from atorvastatin back to simvastatin due to nonspecific musculoskeletal complaints have resolved since making the change.  HISTORY: Past Medical, Surgical, Social, and Family History Reviewed & Updated per EMR.  Pertinent Historical Findings include: Hypertension diabetes, history of prostate cancer and rheumatoid arthritis.  Previously well-controlled diabetes but had marked weight gain at last visit.  Former smoker  OBJECTIVE:  VS: BP:153/76 mmHg  HR:91bpm  TEMP:97.2 F (36.2 C)(Oral)  RESP:   HT:6\' 3"  (190.5 cm)   WT:256 lb 9.6 oz (116.393 kg)  BMI:32.1 PHYSICAL EXAM:  GENERAL:  Adult well-built African American male. In no discomfort; no respiratory distress PSYCH:  alert and appropriate, good insight  HNEENT:  mmm, no JVD CARDIAC:  RRR, S1/S2 heard, no murmur LUNGS:  CTA B, no wheezes, no crackles ABDOMEN:   EXTREM:  Warm, well perfused.  Moves all 4 extremities spontaneously; no lateralization.  Pedal pulses 2/4.  2+ pretibial edema. There are no foot lesions.  Monofilament testing is intact throughout  bilateral LE   ASSESSMENT & PLAN: See problem based charting & AVS for pt instructions.

## 2013-12-06 NOTE — Patient Instructions (Addendum)
Follow up with your Urologist.  Stop your metformin.  Start Invokamet.  If you have had problems obtaining this please let me know.  Please schedule a lab appointment for first thing one morning in the next 1-2 weeks.  Please do not eat anything for 8 hours before your visit (dont eat after midnight).   I will call you with any abnormal labs.  Otherwise, I will send you a letter with your normal results and recommendations for repeat testing  Here are some basic exercise recommendations to remember:  Try to be active every day and throughout the day.    We have actually found that being active throughout the day is likely more important than getting to the gym 5 days per week.  Minimizing being in active should be an important health goal we all are working towards.  A basic starting point can be limiting the time that you sit or lay still during the day.  You should sit/lay/lounge for no longer than 20-30 minutes at a time if you are able.  Even interrupting sitting with standing/jumping jacks/dancing/etc for one minute can have significant health benefits.   Try to remember: "Why sit when you can stand, why stand when you can walk, why walk when you can run."  The point he is to look for opportunities during the day where you can increase your heart rate.    Ideally, I recommend you exercise for at least 30 minutes per day, 5 days per week.  This would involve any activity that will elevate your heart rate to the point that you have a hard time carrying on a normal conversation, but not to the point of being completely out of breath.  There are alternative options however this is a generally good goal to strive for.  You can adjust your intensity based on heart rate (HR).  To calculate your target HR take 220 minus your age, then multiply X 0.7 (70%).  Example for 64 year old:  220 - 41 = 180;  180 X 0.7 = 126  Try to keep your HR within 10 beats of this target throughout your  exercise   I am always happy to talk more about "Exercise as medicine" if you are interested"

## 2013-12-06 NOTE — Assessment & Plan Note (Signed)
Recheck basic labs 

## 2013-12-26 ENCOUNTER — Other Ambulatory Visit: Payer: No Typology Code available for payment source

## 2013-12-26 DIAGNOSIS — E119 Type 2 diabetes mellitus without complications: Secondary | ICD-10-CM

## 2013-12-26 LAB — COMPREHENSIVE METABOLIC PANEL
ALBUMIN: 3.8 g/dL (ref 3.5–5.2)
ALK PHOS: 51 U/L (ref 39–117)
ALT: 11 U/L (ref 0–53)
AST: 13 U/L (ref 0–37)
BUN: 22 mg/dL (ref 6–23)
CO2: 29 mEq/L (ref 19–32)
Calcium: 10.5 mg/dL (ref 8.4–10.5)
Chloride: 99 mEq/L (ref 96–112)
Creat: 1.37 mg/dL — ABNORMAL HIGH (ref 0.50–1.35)
Glucose, Bld: 128 mg/dL — ABNORMAL HIGH (ref 70–99)
POTASSIUM: 4.6 meq/L (ref 3.5–5.3)
SODIUM: 136 meq/L (ref 135–145)
TOTAL PROTEIN: 6.8 g/dL (ref 6.0–8.3)
Total Bilirubin: 0.6 mg/dL (ref 0.2–1.2)

## 2013-12-26 LAB — CBC
HCT: 35.5 % — ABNORMAL LOW (ref 39.0–52.0)
Hemoglobin: 12 g/dL — ABNORMAL LOW (ref 13.0–17.0)
MCH: 28.4 pg (ref 26.0–34.0)
MCHC: 33.8 g/dL (ref 30.0–36.0)
MCV: 83.9 fL (ref 78.0–100.0)
PLATELETS: 237 10*3/uL (ref 150–400)
RBC: 4.23 MIL/uL (ref 4.22–5.81)
RDW: 14.4 % (ref 11.5–15.5)
WBC: 7.7 10*3/uL (ref 4.0–10.5)

## 2013-12-26 LAB — LIPID PANEL
CHOL/HDL RATIO: 5.4 ratio
Cholesterol: 184 mg/dL (ref 0–200)
HDL: 34 mg/dL — AB (ref >39)
LDL CALC: 89 mg/dL (ref 0–99)
TRIGLYCERIDES: 303 mg/dL — AB (ref <150)
VLDL: 61 mg/dL — AB (ref 0–40)

## 2013-12-26 NOTE — Progress Notes (Signed)
CMP,CBC,FLP AND PSA DONE TODAY MARCI HOLDER

## 2013-12-27 LAB — PSA: PSA: 0.31 ng/mL (ref <=4.00)

## 2013-12-29 MED ORDER — ROSUVASTATIN CALCIUM 40 MG PO TABS: 40.0000 mg | ORAL_TABLET | Freq: Every day | ORAL | Status: DC

## 2013-12-29 NOTE — Addendum Note (Signed)
Addended by: Teresa Coombs D on: 12/29/2013 04:45 PM   Modules accepted: Orders, Medications

## 2014-02-08 ENCOUNTER — Encounter: Payer: Self-pay | Admitting: Family Medicine

## 2014-02-08 NOTE — Progress Notes (Unsigned)
Patient stopped by and is needing a note written in order to be rehired by [facility] part-time.  Please fax when completed.

## 2014-02-09 ENCOUNTER — Encounter: Payer: Self-pay | Admitting: Family Medicine

## 2014-03-23 ENCOUNTER — Other Ambulatory Visit: Payer: Self-pay | Admitting: Sports Medicine

## 2014-03-27 NOTE — Telephone Encounter (Signed)
Pt called and needs refill on his diabetic supplies. jw

## 2014-03-28 ENCOUNTER — Encounter: Payer: Self-pay | Admitting: Family Medicine

## 2014-03-28 DIAGNOSIS — M545 Low back pain, unspecified: Secondary | ICD-10-CM

## 2014-03-28 MED ORDER — PANTOPRAZOLE SODIUM 40 MG PO TBEC: 40.0000 mg | DELAYED_RELEASE_TABLET | Freq: Every day | ORAL | Status: DC

## 2014-03-28 MED ORDER — DICLOFENAC SODIUM 75 MG PO TBEC
75.0000 mg | DELAYED_RELEASE_TABLET | Freq: Two times a day (BID) | ORAL | Status: DC
Start: 2014-03-28 — End: 2014-08-09

## 2014-03-28 NOTE — Progress Notes (Signed)
Patient needs his test strips and a refill of Diclofenac called in to [facility].

## 2014-03-28 NOTE — Progress Notes (Signed)
Refilled rx and LVM informing patient

## 2014-07-03 ENCOUNTER — Other Ambulatory Visit: Payer: Self-pay | Admitting: *Deleted

## 2014-07-08 MED ORDER — TAMSULOSIN HCL 0.4 MG PO CAPS: 0.4000 mg | ORAL_CAPSULE | Freq: Every day | ORAL | Status: DC

## 2014-07-13 ENCOUNTER — Telehealth: Payer: Self-pay | Admitting: *Deleted

## 2014-07-13 NOTE — Telephone Encounter (Addendum)
Received refill request from pharmacy for simvastatin 80 mg.  Last refilled on 12/25/13.  Med not on medication list in Epic.  Currently on Crestor.  Will route refill request to Dr. Bonner Puna.  Burna Forts, BSN, RN-BC

## 2014-07-18 NOTE — Telephone Encounter (Signed)
I have yet to meet pt and per last note with Dr. Paulla Fore he was switching back to simvastatin. He needs to make an appointment.

## 2014-07-18 NOTE — Telephone Encounter (Signed)
Spoke with patient and informed him of below. Cannot come in until after 08/07/2014. Dr Bonner Puna schedule not out yet, I will hold message until schedule out and appointment can be made

## 2014-07-25 NOTE — Telephone Encounter (Signed)
Thursday 08/09/2014 @ 10:45am to see Dr Bonner Puna

## 2014-08-09 ENCOUNTER — Encounter: Payer: Self-pay | Admitting: Family Medicine

## 2014-08-09 ENCOUNTER — Ambulatory Visit (INDEPENDENT_AMBULATORY_CARE_PROVIDER_SITE_OTHER): Payer: Medicare Other | Admitting: Family Medicine

## 2014-08-09 VITALS — BP 142/73 | HR 74 | Temp 98.0°F | Ht 75.0 in | Wt 243.8 lb

## 2014-08-09 DIAGNOSIS — J309 Allergic rhinitis, unspecified: Secondary | ICD-10-CM | POA: Insufficient documentation

## 2014-08-09 DIAGNOSIS — E118 Type 2 diabetes mellitus with unspecified complications: Secondary | ICD-10-CM

## 2014-08-09 DIAGNOSIS — J3089 Other allergic rhinitis: Secondary | ICD-10-CM

## 2014-08-09 DIAGNOSIS — I10 Essential (primary) hypertension: Secondary | ICD-10-CM | POA: Diagnosis not present

## 2014-08-09 LAB — POCT GLYCOSYLATED HEMOGLOBIN (HGB A1C): HEMOGLOBIN A1C: 6.4

## 2014-08-09 LAB — POCT URINALYSIS DIPSTICK
Bilirubin, UA: NEGATIVE
Blood, UA: NEGATIVE
Glucose, UA: NEGATIVE
Ketones, UA: NEGATIVE
LEUKOCYTES UA: NEGATIVE
NITRITE UA: NEGATIVE
Protein, UA: NEGATIVE
SPEC GRAV UA: 1.015
Urobilinogen, UA: 0.2
pH, UA: 6.5

## 2014-08-09 LAB — BASIC METABOLIC PANEL
BUN: 16 mg/dL (ref 6–23)
CO2: 29 meq/L (ref 19–32)
Calcium: 9.7 mg/dL (ref 8.4–10.5)
Chloride: 103 mEq/L (ref 96–112)
Creat: 1.1 mg/dL (ref 0.50–1.35)
Glucose, Bld: 110 mg/dL — ABNORMAL HIGH (ref 70–99)
Potassium: 4.1 mEq/L (ref 3.5–5.3)
SODIUM: 139 meq/L (ref 135–145)

## 2014-08-09 MED ORDER — FLUTICASONE PROPIONATE 50 MCG/ACT NA SUSP: 2.0000 | Freq: Every day | NASAL | Status: DC

## 2014-08-09 MED ORDER — GLUCOSE BLOOD VI STRP: ORAL_STRIP | Status: DC

## 2014-08-09 MED ORDER — TRUEPLUS LANCETS 30G MISC: Status: DC

## 2014-08-09 MED ORDER — SILDENAFIL CITRATE 100 MG PO TABS: 100.0000 mg | ORAL_TABLET | ORAL | Status: DC | PRN

## 2014-08-09 NOTE — Assessment & Plan Note (Signed)
Type II diabetes mellitus: Hb A1c repeated today, suspect will be improved based on glucometer readings.  . Goal Hb A1c: < 7.0%. No evidence of end-organ damage.?Perhaps ED  - Creatinine stable, continue ACE inhibitor - Continue ASA, statin - Follow up in 6 months to include Hb A1c, and foot exam. - Eye exam is not up to date. Will call Dr. Kellie Moor office for f/u

## 2014-08-09 NOTE — Assessment & Plan Note (Signed)
Stable, refill flonase 

## 2014-08-09 NOTE — Assessment & Plan Note (Signed)
Stage I; just above goal (if no CKD) today, but reported as normal at home. - Continue current anti-hypertensive regimen - Consider augmentation of medications if no improvement at follow up.  - Check renal function, electrolyte - DASH diet and weight loss strategies reviewed in detail.

## 2014-08-09 NOTE — Progress Notes (Signed)
Subjective: Eric Holloway is a 64 y.o. male with a history of prostate CA, HTN, T2DM here for follow up.  He has no immediate concerns and reports that he has been eating more mindfully avoiding carbohydrates. He and his wife have also been exercising with some regularity, walking on the treadmill during especially cold days.   He was also seen at the [facility] for medical care while he was without insurance. He has had PSA measurements remain stable around 3-4. Since his last visit in 2015 he has discontinued invokamet because it made him feel bad. He has continued metformin 1g BID without issues. He provides his glucometer readings which are mostly 80s-90s FBG's with occasional measurement of postprandial BG ranging no higher than 140. He denies hypoglycemic readings or symptoms. He also stopped crestor and simvastatin due to myalgias but has tolerated pravastatin 40mg  daily (1/2 80mg  tabs).   His blood pressures have been at goal at home (120s-130s / 70s).  - Nonsmoker, no EtOH, works in Water engineer in the laboratory of [employer]: Per HPI in addition: Denies fever, headache, cough, CP, palpitations, SOB, abdominal pain, N/V/D, blood in stool, change in bladder habits, myalgias, arthralgias, and rash.  - Past Medical History: reviewed and updated - Medications: reviewed and updated  Objective: BP 142/73 mmHg  Pulse 74  Temp(Src) 98 F (36.7 C) (Oral)  Ht 6\' 3"  (1.905 m)  Wt 243 lb 12.8 oz (110.587 kg)  BMI 30.47 kg/m2 Gen: Tall, large 64 y.o. male in no distress HEENT: MMM, EOMI, PERRL, anicteric sclerae, + arcus senilis CV: Regular rate, no murmur, rub or gallop, no JVD Resp: Non-labored, CTAB, no wheezes noted Abd: Soft, NT, ND, BS present, no guarding or organomegaly MSK: No edema noted, full ROM Neuro: Alert and oriented, speech normal See Foot Exam  HGBA1C 7.5 12/06/2013  Assessment/Plan: Eric Holloway is a 64 y.o. male here for  routine follow up and medication refills

## 2014-08-09 NOTE — Patient Instructions (Signed)
Thank you for coming in today!  We are checking some labs today, and I will let you know how they are.  As you leave, make an appointment to follow up with me in 6 months.  - Bring all medications in a bag to your visits. - Bring your blood sugar monitor as well.  Take care and seek immediate care sooner if you develop any concerns.  Please feel free to call with any questions or concerns at any time, at [phone]. - Dr. Bonner Puna

## 2014-08-10 ENCOUNTER — Encounter: Payer: Self-pay | Admitting: Family Medicine

## 2014-08-27 ENCOUNTER — Telehealth: Payer: Self-pay | Admitting: Family Medicine

## 2014-08-27 DIAGNOSIS — E118 Type 2 diabetes mellitus with unspecified complications: Secondary | ICD-10-CM

## 2014-08-27 MED ORDER — GLUCOSE BLOOD VI STRP
ORAL_STRIP | Status: DC
Start: 2014-08-27 — End: 2014-12-26

## 2014-08-27 MED ORDER — ACCU-CHEK AVIVA DEVI: Status: AC

## 2014-08-27 NOTE — Telephone Encounter (Signed)
Now has Medicare. Needs a new blood sugar monitior and supplies Medicare will pay for Accucheck [facility] on [address]

## 2014-08-27 NOTE — Telephone Encounter (Signed)
See message from pt below.  Derl Barrow, RN

## 2014-08-31 ENCOUNTER — Telehealth: Payer: Self-pay | Admitting: *Deleted

## 2014-08-31 DIAGNOSIS — E118 Type 2 diabetes mellitus with unspecified complications: Secondary | ICD-10-CM

## 2014-08-31 MED ORDER — ATORVASTATIN CALCIUM 40 MG PO TABS: 40.0000 mg | ORAL_TABLET | Freq: Every day | ORAL | Status: DC

## 2014-08-31 MED ORDER — METFORMIN HCL 1000 MG PO TABS: 1000.0000 mg | ORAL_TABLET | Freq: Two times a day (BID) | ORAL | Status: DC

## 2014-08-31 NOTE — Telephone Encounter (Signed)
This has likely been covered by the [place]. He has been on 1g BID for a while now. this has been refilled. I also sent in lipitor 40mg  daily (has intolerance to multiple other statins). Will make him aware that this should be taken as tolerated.

## 2014-08-31 NOTE — Telephone Encounter (Signed)
Received refill request of metformin, this medication is not on his list.  Will forward to MD. Clinton Sawyer, Salome Spotted

## 2014-08-31 NOTE — Telephone Encounter (Signed)
Spoke with patient and informed him of below 

## 2014-09-19 ENCOUNTER — Encounter: Payer: Self-pay | Admitting: *Deleted

## 2014-09-19 ENCOUNTER — Other Ambulatory Visit: Payer: Self-pay | Admitting: *Deleted

## 2014-09-19 MED ORDER — PANTOPRAZOLE SODIUM 40 MG PO TBEC: 40.0000 mg | DELAYED_RELEASE_TABLET | Freq: Every day | ORAL | Status: DC

## 2014-09-19 MED ORDER — LISINOPRIL-HYDROCHLOROTHIAZIDE 20-25 MG PO TABS: 1.0000 | ORAL_TABLET | Freq: Every day | ORAL | Status: DC

## 2014-09-19 NOTE — Telephone Encounter (Signed)
This encounter was created in error - please disregard.

## 2014-09-24 DIAGNOSIS — N5201 Erectile dysfunction due to arterial insufficiency: Secondary | ICD-10-CM | POA: Diagnosis not present

## 2014-09-24 DIAGNOSIS — C61 Malignant neoplasm of prostate: Secondary | ICD-10-CM | POA: Diagnosis not present

## 2014-09-24 DIAGNOSIS — Z8546 Personal history of malignant neoplasm of prostate: Secondary | ICD-10-CM | POA: Diagnosis not present

## 2014-10-01 DIAGNOSIS — H2513 Age-related nuclear cataract, bilateral: Secondary | ICD-10-CM | POA: Diagnosis not present

## 2014-10-01 DIAGNOSIS — E119 Type 2 diabetes mellitus without complications: Secondary | ICD-10-CM | POA: Diagnosis not present

## 2014-10-01 DIAGNOSIS — H25013 Cortical age-related cataract, bilateral: Secondary | ICD-10-CM | POA: Diagnosis not present

## 2014-11-14 DIAGNOSIS — E118 Type 2 diabetes mellitus with unspecified complications: Secondary | ICD-10-CM | POA: Diagnosis not present

## 2014-11-14 DIAGNOSIS — M79641 Pain in right hand: Secondary | ICD-10-CM | POA: Diagnosis not present

## 2014-11-14 DIAGNOSIS — M059 Rheumatoid arthritis with rheumatoid factor, unspecified: Secondary | ICD-10-CM | POA: Diagnosis not present

## 2014-11-14 DIAGNOSIS — M79642 Pain in left hand: Secondary | ICD-10-CM | POA: Diagnosis not present

## 2014-11-14 DIAGNOSIS — M7531 Calcific tendinitis of right shoulder: Secondary | ICD-10-CM | POA: Diagnosis not present

## 2014-11-20 DIAGNOSIS — Z79899 Other long term (current) drug therapy: Secondary | ICD-10-CM | POA: Diagnosis not present

## 2014-11-20 DIAGNOSIS — M059 Rheumatoid arthritis with rheumatoid factor, unspecified: Secondary | ICD-10-CM | POA: Diagnosis not present

## 2014-12-26 ENCOUNTER — Telehealth: Payer: Self-pay | Admitting: Family Medicine

## 2014-12-26 ENCOUNTER — Other Ambulatory Visit: Payer: Self-pay | Admitting: *Deleted

## 2014-12-26 DIAGNOSIS — E782 Mixed hyperlipidemia: Secondary | ICD-10-CM

## 2014-12-26 DIAGNOSIS — I10 Essential (primary) hypertension: Secondary | ICD-10-CM

## 2014-12-26 DIAGNOSIS — J3089 Other allergic rhinitis: Secondary | ICD-10-CM

## 2014-12-26 DIAGNOSIS — N521 Erectile dysfunction due to diseases classified elsewhere: Secondary | ICD-10-CM

## 2014-12-26 DIAGNOSIS — E1169 Type 2 diabetes mellitus with other specified complication: Secondary | ICD-10-CM

## 2014-12-26 DIAGNOSIS — E118 Type 2 diabetes mellitus with unspecified complications: Secondary | ICD-10-CM

## 2014-12-26 MED ORDER — PANTOPRAZOLE SODIUM 40 MG PO TBEC: 40.0000 mg | DELAYED_RELEASE_TABLET | Freq: Every day | ORAL | Status: DC

## 2014-12-26 MED ORDER — GLUCOSE BLOOD VI STRP: ORAL_STRIP | Status: DC

## 2014-12-26 NOTE — Telephone Encounter (Signed)
Pt called because he will now be getting all his medications through the mail. [employer] needs the doctor or nurse to call and set this up. Their number is [phone] and ask for the pharmacy. jw

## 2014-12-27 MED ORDER — ATORVASTATIN CALCIUM 40 MG PO TABS: 40.0000 mg | ORAL_TABLET | Freq: Every day | ORAL | Status: DC

## 2014-12-27 MED ORDER — FLUTICASONE PROPIONATE 50 MCG/ACT NA SUSP: 2.0000 | Freq: Every day | NASAL | Status: DC

## 2014-12-27 MED ORDER — TAMSULOSIN HCL 0.4 MG PO CAPS: 0.4000 mg | ORAL_CAPSULE | Freq: Every day | ORAL | Status: DC

## 2014-12-27 MED ORDER — ASPIRIN 81 MG PO TBEC: DELAYED_RELEASE_TABLET | ORAL | Status: DC

## 2014-12-27 MED ORDER — PANTOPRAZOLE SODIUM 40 MG PO TBEC: 40.0000 mg | DELAYED_RELEASE_TABLET | Freq: Every day | ORAL | Status: DC

## 2014-12-27 MED ORDER — SILDENAFIL CITRATE 100 MG PO TABS: 100.0000 mg | ORAL_TABLET | ORAL | Status: DC | PRN

## 2014-12-27 MED ORDER — LISINOPRIL-HYDROCHLOROTHIAZIDE 20-25 MG PO TABS: 1.0000 | ORAL_TABLET | Freq: Every day | ORAL | Status: DC

## 2014-12-27 MED ORDER — METFORMIN HCL 1000 MG PO TABS: 1000.0000 mg | ORAL_TABLET | Freq: Two times a day (BID) | ORAL | Status: DC

## 2014-12-27 NOTE — Telephone Encounter (Signed)
I have called [employer] who reports that I can E-script all medications to the pharmacy in [place]. This has been done. Of note, I did not fill his methotrexate or folate as his RA is managed by a rheumatologist.

## 2015-02-19 ENCOUNTER — Telehealth: Payer: Self-pay | Admitting: Family Medicine

## 2015-02-19 DIAGNOSIS — N521 Erectile dysfunction due to diseases classified elsewhere: Principal | ICD-10-CM

## 2015-02-19 DIAGNOSIS — E1169 Type 2 diabetes mellitus with other specified complication: Secondary | ICD-10-CM

## 2015-02-19 NOTE — Telephone Encounter (Signed)
Needs 20 mg of sildenafil. His insurance will not pay for the  100mg  pills Please advise

## 2015-02-20 MED ORDER — SILDENAFIL CITRATE 25 MG PO TABS: 25.0000 mg | ORAL_TABLET | ORAL | Status: DC | PRN

## 2015-02-28 ENCOUNTER — Telehealth: Payer: Self-pay | Admitting: Family Medicine

## 2015-02-28 DIAGNOSIS — M069 Rheumatoid arthritis, unspecified: Secondary | ICD-10-CM

## 2015-02-28 MED ORDER — SILDENAFIL CITRATE 20 MG PO TABS: 20.0000 mg | ORAL_TABLET | Freq: Three times a day (TID) | ORAL | Status: DC

## 2015-02-28 NOTE — Telephone Encounter (Signed)
Done. Thank you.

## 2015-02-28 NOTE — Telephone Encounter (Signed)
Pt needs a [employer] referral to see Dr. Hurley Cisco, a rheumatologist, located at [address] [place], [place] [address]; phone: [phone]. Thank you, Fonda Kinder, ASA

## 2015-02-28 NOTE — Telephone Encounter (Signed)
Patient stated he already spoke with someone and taken care of.  Derl Barrow, RN

## 2015-02-28 NOTE — Telephone Encounter (Signed)
Pt returning call, did not see any phone notes. He would like for a nurse to call him back. Sadie Reynolds, ASA

## 2015-02-28 NOTE — Telephone Encounter (Signed)
Needs Sildenafil 20 mg sent to [facility] on [address]. He states that he called for a refill on this prior to today and 25mg  was sent and they are charging him $250. He states that "this is not what I asked for." Please contact pt once this is corrected. Thank you, Fonda Kinder, ASA

## 2015-03-01 NOTE — Telephone Encounter (Signed)
Will forward to MD to place a referral. Jazmin Hartsell,CMA  

## 2015-03-01 NOTE — Telephone Encounter (Signed)
This has been done.

## 2015-03-05 MED ORDER — SILDENAFIL CITRATE 20 MG PO TABS: 20.0000 mg | ORAL_TABLET | Freq: Three times a day (TID) | ORAL | Status: DC

## 2015-03-05 NOTE — Addendum Note (Signed)
Addended by: Derl Barrow on: 03/05/2015 03:42 PM   Modules accepted: Orders

## 2015-03-05 NOTE — Telephone Encounter (Addendum)
Patient walked into clinic today requesting Sidenafil be sent to [facility].  Refill was approved on 02/28/15 but sent to [facility].  Medication sent to [facility].  Derl Barrow, RN

## 2015-03-12 ENCOUNTER — Encounter: Payer: Self-pay | Admitting: Family Medicine

## 2015-03-12 ENCOUNTER — Ambulatory Visit (INDEPENDENT_AMBULATORY_CARE_PROVIDER_SITE_OTHER): Payer: Commercial Managed Care - HMO | Admitting: Family Medicine

## 2015-03-12 VITALS — BP 133/66 | HR 69 | Temp 98.1°F | Ht 75.0 in | Wt 244.5 lb

## 2015-03-12 DIAGNOSIS — M069 Rheumatoid arthritis, unspecified: Secondary | ICD-10-CM

## 2015-03-12 DIAGNOSIS — E118 Type 2 diabetes mellitus with unspecified complications: Secondary | ICD-10-CM | POA: Diagnosis not present

## 2015-03-12 LAB — POCT GLYCOSYLATED HEMOGLOBIN (HGB A1C): HEMOGLOBIN A1C: 6.9

## 2015-03-12 NOTE — Patient Instructions (Signed)
Thank you for coming in today!  Our clinic's number is [phone]. Feel free to call any time with questions or concerns. We will answer any questions after hours with our 24-hour emergency line at that number as well.   - Dr. Bless Lisenby 

## 2015-03-12 NOTE — Assessment & Plan Note (Signed)
Hb A1c at goal (< 7%) at 6.9% without hypoglycemia. No current evidence of end-organ damage.  - Continue ACE-HCTZ, recheck BMET at f/u - Continue ASA daily - Continue lipitor 40mg , recheck lipids at f/u - Eye exam is up to date.  - Follow up in 6 months

## 2015-03-12 NOTE — Progress Notes (Signed)
Subjective: Eric Holloway is a 64 y.o. male here for diabetes follow up.   He was diagnosed with T2DM a little over 10 years ago. Never been on insulin. Takes metformin 1000mg  BID, reporting 100% compliance. He checks his sugars 2 times per day. Lowest: 106 in the afternoon, with no symptoms. Highest: Sometimes early in the morning if he stayed up late at night and ate. Fasting: 110-120. Last eye exam: Feb 2016. He avoids carbs , has found time to exercise.  - He has not tolerated sildenafil due to headache, but reports cialis has worked in the past.  - Sees Dr. Charlestine Night for Rheumatology but requires referral for insurance purposes.   - ROS: Denies fever, chills, weight loss, dizziness, vision changes, syncope, polyuria, nocturia, numbness, polydipsia, chest pain, new wounds.  - [place]: works at [employer], non-smoker, no EtOH, no illicit drugs. - Medications: reviewed and updated Past Medical History  Diagnosis Date  . Diabetes mellitus   . Hypertension   . Arthritis   . Prostate cancer 2005  . Ulcer 1972  . Contusion of flank and back 09/17/2011   Family History  Problem Relation Age of Onset  . Stomach cancer Maternal Uncle   . Colon cancer Neg Hx    Social History   Social History  . Marital Status: Married    Spouse Name: N/A  . Number of Children: N/A  . Years of Education: N/A   Social History Main Topics  . Smoking status: Former Research scientist (life sciences)  . Smokeless tobacco: None  . Alcohol Use: No  . Drug Use: No  . Sexual Activity: Not Asked   Other Topics Concern  . None   Social History Narrative   Patient Active Problem List   Diagnosis Date Noted  . Allergic rhinitis 08/09/2014  . Preventive measure 06/12/2013  . Right ankle pain 07/27/2011  . Screening for colorectal cancer 03/12/2011  . LOW BACK PAIN, CHRONIC 07/09/2009  . OBESITY 09/07/2008  . Mixed hyperlipidemia 03/26/2008  . PARESTHESIA 04/20/2007  . T2DM (type 2 diabetes mellitus) 09/08/2006  . PROSTATE  CANCER, HX OF 09/08/2006  . ONYCHOMYCOSIS 09/02/2006  . HYPERTENSION, BENIGN SYSTEMIC 09/02/2006  . Rheumatoid arthritis 09/02/2006  . PROTEINURIA 09/02/2006    Objective: BP 133/66 mmHg  Pulse 69  Temp(Src) 98.1 F (36.7 C) (Oral)  Ht 6\' 3"  (1.905 m)  Wt 244 lb 8 oz (110.904 kg)  BMI 30.56 kg/m2 Gen: Well-appearing 64 y.o.male in no distress HEENT: Normocephalic, sclerae/conjunctivae clear, PERRL, MMM, posterior oropharynx clear, good dentition Neck: Neck supple, no masses or lymphadenopathy; thyroid not enlarged  Pulm: Non-labored; CTAB, no wheezes  CV: Regular rate, no murmur appreciated; no LE edema, no JVD GI: Normoactive BS; soft, non-tender, non-distended, no HSM Skin: No wounds or rashes, no acanthosis nigricans See diabetic foot exam - simple  Neuro: CN II-XII without deficits, sensation intact to light touch, steady gait.   Assessment & Plan: [name] is a 64 y.o. male here for diabetes, at goal.    T2DM (type 2 diabetes mellitus) Hb A1c at goal (< 7%) at 6.9% without hypoglycemia. No current evidence of end-organ damage.  - Continue ACE-HCTZ, recheck BMET at f/u - Continue ASA daily - Continue lipitor 40mg , recheck lipids at f/u - Eye exam is up to date.  - Follow up in 6 months

## 2015-03-20 ENCOUNTER — Telehealth: Payer: Self-pay | Admitting: Family Medicine

## 2015-03-20 NOTE — Telephone Encounter (Signed)
Pt called because he thought that Dr. Bonner Puna was suppose to send in some medication for in. He was seen in August and never received any medication. Patient would like to speak to Dr. Bonner Puna about this. jw

## 2015-04-15 ENCOUNTER — Encounter: Payer: Self-pay | Admitting: Family Medicine

## 2015-04-15 ENCOUNTER — Ambulatory Visit (INDEPENDENT_AMBULATORY_CARE_PROVIDER_SITE_OTHER): Payer: Commercial Managed Care - HMO | Admitting: Family Medicine

## 2015-04-15 VITALS — BP 137/70 | HR 80 | Temp 98.7°F | Wt 240.0 lb

## 2015-04-15 DIAGNOSIS — R109 Unspecified abdominal pain: Secondary | ICD-10-CM | POA: Diagnosis not present

## 2015-04-15 MED ORDER — IBUPROFEN 600 MG PO TABS: 600.0000 mg | ORAL_TABLET | Freq: Three times a day (TID) | ORAL | Status: DC | PRN

## 2015-04-15 MED ORDER — CYCLOBENZAPRINE HCL 5 MG PO TABS: 5.0000 mg | ORAL_TABLET | Freq: Three times a day (TID) | ORAL | Status: DC | PRN

## 2015-04-15 NOTE — Patient Instructions (Addendum)
Thank you for coming to see me today. It was a pleasure. Today we talked about:   Back pain: This is most likely muscle related. I am giving you ibuprofen to use for 5-10 days and Flexeril. Please see how the Flexeril will affect you before driving or going to work. If your symptoms worsen, please return for follow-up. You can use ice/heat to help with symptoms.  Please make an appointment to see Dr. Bonner Puna for follow-up  If you have any questions or concerns, please do not hesitate to call the office at [phone].  Sincerely,  Cordelia Poche, MD

## 2015-04-15 NOTE — Progress Notes (Signed)
    Subjective   Eric Holloway is a 64 y.o. male that presents for a same day visit  1. Left back pain: Symptoms started after falling about 2 weeks ago. At that time, he reports his leg giving out on him. He fell forward and caught himself before falling on the floor. Pain has remained unchanged. Pain is present when sitting, lying down and standing up. He describes it as burning pain that radiates from lower back and sometimes down the anterior portion of thigh. Tylenol 650mg  and Tramadol were helping with pain.  ROS Per HPI  Social History  Substance Use Topics  . Smoking status: Former Research scientist (life sciences)  . Smokeless tobacco: None  . Alcohol Use: No    No Known Allergies  Objective   BP 137/70 mmHg  Pulse 80  Temp(Src) 98.7 F (37.1 C) (Oral)  Wt 240 lb (108.863 kg)  General: Well appearing Musculoskeletal: Tenderness along left side of lower back. Full flexion, extension, rotation and side-to-side flexion of waist. Patient does have slight pain when rotating waist.   Assessment and Plan   No orders of the defined types were placed in this encounter.    Left flank pain: appears to be muscle strain  Ibuprofen trial  Flexeril  Ice/heat  Exercises given  Follow-up if symptoms worsen/do not improve.

## 2015-04-16 ENCOUNTER — Other Ambulatory Visit: Payer: Self-pay | Admitting: Family Medicine

## 2015-04-16 MED ORDER — SILDENAFIL CITRATE 50 MG PO TABS: 50.0000 mg | ORAL_TABLET | Freq: Every day | ORAL | Status: DC | PRN

## 2015-04-17 ENCOUNTER — Encounter: Payer: Self-pay | Admitting: Family Medicine

## 2015-04-17 DIAGNOSIS — N5235 Erectile dysfunction following radiation therapy: Secondary | ICD-10-CM | POA: Insufficient documentation

## 2015-08-12 DIAGNOSIS — M0570 Rheumatoid arthritis with rheumatoid factor of unspecified site without organ or systems involvement: Secondary | ICD-10-CM | POA: Diagnosis not present

## 2015-08-12 DIAGNOSIS — M7531 Calcific tendinitis of right shoulder: Secondary | ICD-10-CM | POA: Diagnosis not present

## 2015-08-12 DIAGNOSIS — R799 Abnormal finding of blood chemistry, unspecified: Secondary | ICD-10-CM | POA: Diagnosis not present

## 2015-08-12 DIAGNOSIS — D509 Iron deficiency anemia, unspecified: Secondary | ICD-10-CM | POA: Diagnosis not present

## 2015-08-12 DIAGNOSIS — Z79899 Other long term (current) drug therapy: Secondary | ICD-10-CM | POA: Diagnosis not present

## 2015-08-22 ENCOUNTER — Telehealth: Payer: Self-pay | Admitting: *Deleted

## 2015-08-22 NOTE — Telephone Encounter (Signed)
Called patient to offer flu vaccine. Patient states he received flu vaccine at [facility] as an employee at the end of September. Added to historical immunization record. Velora Heckler, RN

## 2015-08-23 ENCOUNTER — Ambulatory Visit (INDEPENDENT_AMBULATORY_CARE_PROVIDER_SITE_OTHER): Payer: Medicare Other | Admitting: Family Medicine

## 2015-08-23 ENCOUNTER — Encounter: Payer: Self-pay | Admitting: Family Medicine

## 2015-08-23 VITALS — BP 143/71 | HR 75 | Temp 98.0°F | Wt 243.9 lb

## 2015-08-23 DIAGNOSIS — Z114 Encounter for screening for human immunodeficiency virus [HIV]: Secondary | ICD-10-CM | POA: Diagnosis not present

## 2015-08-23 DIAGNOSIS — Z1159 Encounter for screening for other viral diseases: Secondary | ICD-10-CM | POA: Diagnosis not present

## 2015-08-23 DIAGNOSIS — E118 Type 2 diabetes mellitus with unspecified complications: Secondary | ICD-10-CM

## 2015-08-23 DIAGNOSIS — I10 Essential (primary) hypertension: Secondary | ICD-10-CM

## 2015-08-23 LAB — LIPID PANEL
CHOL/HDL RATIO: 3.9 ratio (ref <=5.0)
Cholesterol: 128 mg/dL (ref 125–200)
HDL: 33 mg/dL — ABNORMAL LOW (ref >=40)
LDL CALC: 48 mg/dL (ref <130)
Triglycerides: 233 mg/dL — ABNORMAL HIGH (ref <150)
VLDL: 47 mg/dL — AB (ref <30)

## 2015-08-23 LAB — BASIC METABOLIC PANEL WITH GFR
BUN: 14 mg/dL (ref 7–25)
CALCIUM: 9.6 mg/dL (ref 8.6–10.3)
CO2: 30 mmol/L (ref 20–31)
Chloride: 101 mmol/L (ref 98–110)
Creat: 1.05 mg/dL (ref 0.70–1.25)
GFR, EST AFRICAN AMERICAN: 86 mL/min (ref >=60)
GFR, EST NON AFRICAN AMERICAN: 74 mL/min (ref >=60)
GLUCOSE: 96 mg/dL (ref 65–99)
Potassium: 4.3 mmol/L (ref 3.5–5.3)
Sodium: 138 mmol/L (ref 135–146)

## 2015-08-23 LAB — CBC
HEMATOCRIT: 37.1 % — AB (ref 39.0–52.0)
Hemoglobin: 12.2 g/dL — ABNORMAL LOW (ref 13.0–17.0)
MCH: 28.5 pg (ref 26.0–34.0)
MCHC: 32.9 g/dL (ref 30.0–36.0)
MCV: 86.7 fL (ref 78.0–100.0)
MPV: 10.3 fL (ref 8.6–12.4)
PLATELETS: 254 10*3/uL (ref 150–400)
RBC: 4.28 MIL/uL (ref 4.22–5.81)
RDW: 14.8 % (ref 11.5–15.5)
WBC: 7 10*3/uL (ref 4.0–10.5)

## 2015-08-23 LAB — POCT GLYCOSYLATED HEMOGLOBIN (HGB A1C): Hemoglobin A1C: 7.3

## 2015-08-23 NOTE — Progress Notes (Signed)
Subjective: Eric Holloway is a 65 y.o. male here for diabetes follow up.   He was diagnosed with T2DM a little over 10 years ago. Never been on insulin. Takes metformin 1000mg  BID, reporting 100% compliance. He checks his sugars 2 times per day. Doesn't have meter but sugars have been a little higher than usual. 150s in AM. Last eye exam: Feb 2016. He blames dietary indiscretions.   - ROS: Denies fever, chills, weight loss, dizziness, vision changes, syncope, polyuria, nocturia, numbness, polydipsia, chest pain, new wounds.  - [place]: works at [employer], non-smoker, no EtOH, no illicit drugs. - Medications: reviewed and updated  Objective: BP 143/71 mmHg  Pulse 75  Temp(Src) 98 F (36.7 C) (Oral)  Wt 243 lb 14.4 oz (110.632 kg) Gen: Well-appearing 65 y.o.male in no distress HEENT: Normocephalic, sclerae/conjunctivae clear, PERRL, MMM, posterior oropharynx clear, good dentition Neck: Neck supple, no masses or lymphadenopathy; thyroid not enlarged  Pulm: Non-labored; CTAB, no wheezes  CV: Regular rate, no murmur appreciated; no LE edema, no JVD GI: Normoactive BS; soft, non-tender, non-distended, no HSM Skin: No wounds or rashes, no acanthosis nigricans See diabetic foot exam - simple  Neuro: CN II-XII without deficits, sensation intact to light touch, steady gait.  Assessment & Plan: Eric Holloway is a 65 y.o. male here for diabetes, at goal.    HYPERTENSION, BENIGN SYSTEMIC Borderline elevated for pt w/o CKD - Continue current anti-hypertensive regimen - DASH diet and weight loss strategies reviewed in detail.  - Consider increasing/adding medication if not at goal on follow up.   T2DM (type 2 diabetes mellitus) Hb A1c 7.3 from 6.9 from 6.4% indicating steadily worsening insulin resistance/pancreatic depletion.  - Recommended start [facility] in adition to maxed metformin, but pt would like to solely intensify lifestyle modifications. He seems very motivated. He will involve his  wife in these efforts.  - Check creatinine, continue ACE inhibitor - Continue ASA and statin statin - Follow up in 3 months to include Hb A1c, and foot exam. - Monitor borderline above goal HTN at f/u.

## 2015-08-23 NOTE — Patient Instructions (Signed)
Follow up in 3 months   MyPlate from USDA The general, healthful diet is based on the 2010 Dietary Guidelines for Americans. The amount of food you need to eat from each food group depends on your age, sex, and level of physical activity and can be individualized by a dietitian. Go to CashmereCloseouts.hu for more information. WHAT DO I NEED TO KNOW ABOUT THE [place] PLAN?  Enjoy your food, but eat less.   Avoid oversized portions.    of your plate should include fruits and vegetables.   of your plate should be grains.   of your plate should be protein. Grains  Make at least half of your grains whole grains.  For a 2,000 calorie daily food plan, eat 6 oz every day.  1 oz is about 1 slice bread, 1 cup cereal, or  cup cooked rice, cereal, or pasta. Vegetables  Make half your plate fruits and vegetables.  For a 2,000 calorie daily food plan, eat 2 cups every day.  1 cup is about 1 cup raw or cooked vegetables or vegetable juice or 2 cups raw leafy greens. Fruits  Make half your plate fruits and vegetables.  For a 2,000 calorie daily food plan, eat 2 cups every day.  1 cup is about 1 cup fruit or 100% fruit juice or  cup dried fruit. Protein  For a 2,000 calorie daily food plan, eat 5 oz every day.  1 oz is about 1 oz meat, poultry, or fish,  cup cooked beans, 1 egg, 1 Tbsp peanut butter, or  oz nuts or seeds. Dairy  Switch to fat-free or low-fat (1%) milk.  For a 2,000 calorie daily food plan, eat 3 cups every day.  1 cup is about 1 cup milk or yogurt or soy milk (soy beverage), 1 oz natural cheese, or 2 oz processed cheese. Fats, Oils, and Empty Calories  Only small amounts of oils are recommended.  Empty calories are calories from solid fats or added sugars.  Compare sodium in foods like soup, bread, and frozen meals. Choose the foods with lower numbers.  Drink water instead of sugary drinks. WHAT FOODS CAN I EAT? Grains Whole grains such as whole  wheat, quinoa, millet, and bulgur. Bread, rolls, and pasta made from whole grains. Brown or wild rice. Hot or cold cereals made from whole grains and without added sugar. Vegetables All fresh vegetables, especially fresh red, dark green, or orange vegetables. Peas and beans. Low-sodium frozen or canned vegetables prepared without added salt. Low-sodium vegetable juices. Fruits All fresh, frozen, and dried fruits. Canned fruit packed in water or fruit juice without added sugar. Fruit juices without added sugar. Meats and Other Protein Sources Boiled, baked, or grilled lean meat trimmed of fat. Skinless poultry. Fresh seafood and shellfish. Canned seafood packed in water. Unsalted nuts and unsalted nut butters. Tofu. Dried beans and pea. Eggs. Dairy Low-fat or fat-free milk, yogurt, and cheeses.  Sweets and Desserts Frozen desserts made from low-fat milk. Fats and Oils Olive, peanut, and canola oils and margarine. Salad dressing and mayonnaise made from these oils. Other Soups and casseroles made from allowed ingredients and without added fat or salt. The items listed above may not be a complete list of recommended foods or beverages. Contact your dietitian for more options.  WHAT FOODS ARE NOT RECOMMENDED? Grains Sweetened, low-fiber cereals. Packaged baked goods. Snack crackers and chips. Cheese crackers, butter crackers, and biscuits. Frozen waffles, sweet breads, doughnuts, pastries, packaged baking mixes, pancakes, cakes, and  cookies. Vegetables Regular canned or frozen vegetables or vegetables prepared with salt. Canned tomatoes. Canned tomato sauce. Fried vegetables. Vegetables in cream sauce or cheese sauce. Fruits Fruits packed in syrup or made with added sugar.  Meats and Other Protein Sources Marbled or fatty meats such as ribs. Poultry with skin. Fried meats, poultry, eggs, or fish. Sausages, hot dogs, and deli meats such as pastrami, bologna, or salami. Dairy Whole milk, cream,  cheeses made from whole milk, sour cream. Ice cream or yogurt made from whole milk or with added sugar. Beverages For adults, no more than one alcoholic drink per day. Regular soft drinks or other sugary beverages. Juice drinks. Sweets and Desserts Sugary or fatty desserts, candy, and other sweets. Fats and Oils Solid shortening or partially hydrogenated oils. Solid margarine. Margarine that contains trans fats. Butter. The items listed above may not be a complete list of foods and beverages to avoid. Contact your dietitian for more information.   This information is not intended to replace advice given to you by your health care provider. Make sure you discuss any questions you have with your health care provider.   DASH Eating Plan DASH stands for "Dietary Approaches to Stop Hypertension." The DASH eating plan is a healthy eating plan that has been shown to reduce high blood pressure (hypertension). Additional health benefits may include reducing the risk of type 2 diabetes mellitus, heart disease, and stroke. The DASH eating plan may also help with weight loss. WHAT DO I NEED TO KNOW ABOUT THE DASH EATING PLAN? For the DASH eating plan, you will follow these general guidelines:  Choose foods with a percent daily value for sodium of less than 5% (as listed on the food label).  Use salt-free seasonings or herbs instead of table salt or sea salt.  Check with your health care provider or pharmacist before using salt substitutes.  Eat lower-sodium products, often labeled as "lower sodium" or "no salt added."  Eat fresh foods.  Eat more vegetables, fruits, and low-fat dairy products.  Choose whole grains. Look for the word "whole" as the first word in the ingredient list.  Choose fish and skinless chicken or [place] more often than red meat. Limit fish, poultry, and meat to 6 oz (170 g) each day.  Limit sweets, desserts, sugars, and sugary drinks.  Choose heart-healthy fats.  Limit  cheese to 1 oz (28 g) per day.  Eat more home-cooked food and less restaurant, buffet, and fast food.  Limit fried foods.  Cook foods using methods other than frying.  Limit canned vegetables. If you do use them, rinse them well to decrease the sodium.  When eating at a restaurant, ask that your food be prepared with less salt, or no salt if possible. WHAT FOODS CAN I EAT? Seek help from a dietitian for individual calorie needs. Grains Whole grain or whole wheat bread. Brown rice. Whole grain or whole wheat pasta. Quinoa, bulgur, and whole grain cereals. Low-sodium cereals. Corn or whole wheat flour tortillas. Whole grain cornbread. Whole grain crackers. Low-sodium crackers. Vegetables Fresh or frozen vegetables (raw, steamed, roasted, or grilled). Low-sodium or reduced-sodium tomato and vegetable juices. Low-sodium or reduced-sodium tomato sauce and paste. Low-sodium or reduced-sodium canned vegetables.  Fruits All fresh, canned (in natural juice), or frozen fruits. Meat and Other Protein Products Ground beef (85% or leaner), grass-fed beef, or beef trimmed of fat. Skinless chicken or [place]. Ground chicken or [place]. Pork trimmed of fat. All fish and seafood. Eggs. Dried beans,  peas, or lentils. Unsalted nuts and seeds. Unsalted canned beans. Dairy Low-fat dairy products, such as skim or 1% milk, 2% or reduced-fat cheeses, low-fat ricotta or cottage cheese, or plain low-fat yogurt. Low-sodium or reduced-sodium cheeses. Fats and Oils Tub margarines without trans fats. Light or reduced-fat mayonnaise and salad dressings (reduced sodium). Avocado. Safflower, olive, or canola oils. Natural peanut or almond butter. Other Unsalted popcorn and pretzels.  WHAT FOODS ARE NOT RECOMMENDED? Grains White bread. White pasta. White rice. Refined cornbread. Bagels and croissants. Crackers that contain trans fat. Vegetables Creamed or fried vegetables. Vegetables in a cheese sauce. Regular canned  vegetables. Regular canned tomato sauce and paste. Regular tomato and vegetable juices. Fruits Dried fruits. Canned fruit in light or heavy syrup. Fruit juice. Meat and Other Protein Products Fatty cuts of meat. Ribs, chicken wings, bacon, sausage, bologna, salami, chitterlings, fatback, hot dogs, bratwurst, and packaged luncheon meats. Salted nuts and seeds. Canned beans with salt. Dairy Whole or 2% milk, cream, half-and-half, and cream cheese. Whole-fat or sweetened yogurt. Full-fat cheeses or blue cheese. Nondairy creamers and whipped toppings. Processed cheese, cheese spreads, or cheese curds. Condiments Onion and garlic salt, seasoned salt, table salt, and sea salt. Canned and packaged gravies. Worcestershire sauce. Tartar sauce. Barbecue sauce. Teriyaki sauce. Soy sauce, including reduced sodium. Steak sauce. Fish sauce. Oyster sauce. Cocktail sauce. Horseradish. Ketchup and mustard. Meat flavorings and tenderizers. Bouillon cubes. Hot sauce. Tabasco sauce. Marinades. Taco seasonings. Relishes. Fats and Oils Butter, stick margarine, lard, shortening, ghee, and bacon fat. Coconut, palm kernel, or palm oils. Regular salad dressings. Other Pickles and olives. Salted popcorn and pretzels. The items listed above may not be a complete list of foods and beverages to avoid. Contact your dietitian for more information. WHERE CAN I FIND MORE INFORMATION? National Heart, Lung, and Blood Institute: travelstabloid.com

## 2015-08-23 NOTE — Assessment & Plan Note (Signed)
Borderline elevated for pt w/o CKD - Continue current anti-hypertensive regimen - DASH diet and weight loss strategies reviewed in detail.  - Consider increasing/adding medication if not at goal on follow up.

## 2015-08-23 NOTE — Assessment & Plan Note (Signed)
Hb A1c 7.3 from 6.9 from 6.4% indicating steadily worsening insulin resistance/pancreatic depletion.  - Recommended start [facility] in adition to maxed metformin, but pt would like to solely intensify lifestyle modifications. He seems very motivated. He will involve his wife in these efforts.  - Check creatinine, continue ACE inhibitor - Continue ASA and statin statin - Follow up in 3 months to include Hb A1c, and foot exam. - Monitor borderline above goal HTN at f/u.

## 2015-08-24 LAB — HEPATITIS C ANTIBODY: HCV AB: NEGATIVE

## 2015-08-24 LAB — HIV ANTIBODY (ROUTINE TESTING W REFLEX): HIV: NONREACTIVE

## 2015-08-26 ENCOUNTER — Other Ambulatory Visit: Payer: Self-pay | Admitting: Family Medicine

## 2015-08-26 DIAGNOSIS — I10 Essential (primary) hypertension: Secondary | ICD-10-CM

## 2015-08-26 DIAGNOSIS — E118 Type 2 diabetes mellitus with unspecified complications: Secondary | ICD-10-CM

## 2015-08-26 NOTE — Telephone Encounter (Signed)
Pt called and now is using the [facility] mail pharmacy. He needs the doctor to send in all his medication to them and also they need to be in 3 month supply. jw

## 2015-08-26 NOTE — Telephone Encounter (Signed)
We discussed this at the last office visit. [facility] does not have a mail pharmacy that I see, so I need to know what actual pharmacy the prescriptions need to be sent to. Alternatively, they could transfer the prescriptions from his current pharmacy to that one.

## 2015-08-26 NOTE — Telephone Encounter (Signed)
Spoke to pt. He said the pharmacy is [facility] ([employer] [employer])  or something like that. He is at work and doesn't have the information with him. He said when he signed up for the plan he was told that they would not transfer prescriptions and he would need to get new prescriptions. I told him I would send this to the Dr, and would call him tomorrow to get the name of the pharmacy. Ottis Stain, CMA

## 2015-08-27 ENCOUNTER — Telehealth: Payer: Self-pay | Admitting: Family Medicine

## 2015-08-27 NOTE — Telephone Encounter (Signed)
This was handled in the last phone note that was created per Erskine Emery, [place]. Katharina Caper, Danyon Mcginness D, [place]

## 2015-08-27 NOTE — Telephone Encounter (Signed)
The medications he needs refilling are atorvastatin, fluticasone, glucose blood test strips, lisinopril, metformin, methotrexate, pantoprazole and tamsulosin. Ottis Stain, CMA

## 2015-08-27 NOTE — Telephone Encounter (Signed)
The name of the mail order company that he needs his prescriptions sent to is [facility] [phone] and fax is [phone].

## 2015-08-28 ENCOUNTER — Encounter: Payer: Self-pay | Admitting: Family Medicine

## 2015-08-28 MED ORDER — TAMSULOSIN HCL 0.4 MG PO CAPS: 0.4000 mg | ORAL_CAPSULE | Freq: Every day | ORAL | Status: DC

## 2015-08-28 MED ORDER — LISINOPRIL-HYDROCHLOROTHIAZIDE 20-25 MG PO TABS: 1.0000 | ORAL_TABLET | Freq: Every day | ORAL | Status: DC

## 2015-08-28 MED ORDER — GLUCOSE BLOOD VI STRP: ORAL_STRIP | Status: DC

## 2015-08-28 MED ORDER — ATORVASTATIN CALCIUM 40 MG PO TABS: 40.0000 mg | ORAL_TABLET | Freq: Every day | ORAL | Status: DC

## 2015-08-28 MED ORDER — FOLIC ACID 1 MG PO TABS: 1.0000 mg | ORAL_TABLET | Freq: Every day | ORAL | Status: DC

## 2015-08-28 MED ORDER — METHOTREXATE 2.5 MG PO TABS: 10.0000 mg | ORAL_TABLET | ORAL | Status: DC

## 2015-08-28 MED ORDER — FLUTICASONE PROPIONATE 50 MCG/ACT NA SUSP: 2.0000 | Freq: Every day | NASAL | Status: DC

## 2015-08-28 MED ORDER — METFORMIN HCL 1000 MG PO TABS: 1000.0000 mg | ORAL_TABLET | Freq: Two times a day (BID) | ORAL | Status: DC

## 2015-08-28 NOTE — Telephone Encounter (Signed)
I have sent in all medications that have been refilled on an ongoing basis. Sent to [facility] in [place], [place].

## 2015-08-28 NOTE — Addendum Note (Signed)
Addended by: Vance Gather B on: 08/28/2015 11:37 AM   Modules accepted: Orders, Medications

## 2015-08-30 ENCOUNTER — Telehealth: Payer: Self-pay | Admitting: *Deleted

## 2015-08-30 NOTE — Telephone Encounter (Signed)
Received a fax from [facility] for Lantus SoloStar.  Form placed in provider box for review.  Derl Barrow, RN

## 2015-09-04 NOTE — Telephone Encounter (Signed)
?  This has never been prescribed for Mr. Rockwell, so the request was denied. I have faxed the form back.

## 2015-09-05 DIAGNOSIS — M0570 Rheumatoid arthritis with rheumatoid factor of unspecified site without organ or systems involvement: Secondary | ICD-10-CM | POA: Diagnosis not present

## 2015-09-05 DIAGNOSIS — Z79899 Other long term (current) drug therapy: Secondary | ICD-10-CM | POA: Diagnosis not present

## 2015-11-01 ENCOUNTER — Other Ambulatory Visit: Payer: Self-pay | Admitting: Family Medicine

## 2015-11-06 ENCOUNTER — Other Ambulatory Visit: Payer: Self-pay | Admitting: Family Medicine

## 2015-11-25 DIAGNOSIS — E119 Type 2 diabetes mellitus without complications: Secondary | ICD-10-CM | POA: Diagnosis not present

## 2015-11-25 DIAGNOSIS — H25013 Cortical age-related cataract, bilateral: Secondary | ICD-10-CM | POA: Diagnosis not present

## 2015-11-25 DIAGNOSIS — H2513 Age-related nuclear cataract, bilateral: Secondary | ICD-10-CM | POA: Diagnosis not present

## 2015-11-28 ENCOUNTER — Encounter: Payer: Self-pay | Admitting: Family Medicine

## 2015-11-28 ENCOUNTER — Ambulatory Visit (INDEPENDENT_AMBULATORY_CARE_PROVIDER_SITE_OTHER): Payer: Medicare Other | Admitting: Family Medicine

## 2015-11-28 VITALS — BP 109/59 | HR 85 | Temp 98.1°F | Ht 75.0 in | Wt 236.0 lb

## 2015-11-28 DIAGNOSIS — Z23 Encounter for immunization: Secondary | ICD-10-CM | POA: Diagnosis not present

## 2015-11-28 DIAGNOSIS — E118 Type 2 diabetes mellitus with unspecified complications: Secondary | ICD-10-CM | POA: Diagnosis not present

## 2015-11-28 DIAGNOSIS — H9193 Unspecified hearing loss, bilateral: Secondary | ICD-10-CM | POA: Diagnosis not present

## 2015-11-28 LAB — POCT GLYCOSYLATED HEMOGLOBIN (HGB A1C): HEMOGLOBIN A1C: 6.8

## 2015-11-28 MED ORDER — ZOSTER VACCINE LIVE 19400 UNT/0.65ML ~~LOC~~ SUSR: 0.6500 mL | Freq: Once | SUBCUTANEOUS | Status: DC

## 2015-11-28 NOTE — Progress Notes (Signed)
Subjective: Eric Holloway is a 65 y.o. male here for diabetes follow up.   He reports lightheadedness waxing/waning most days for the past 2 months or so. He tried stopping lipitor for 3 days with little change in symptoms. This problem improves when he gets better rest, but he has been going to sleep around midnight or later and waking up at 5:00am to take his wife to work.  Reports having a negative sleep study in the recent past.   Has chronic hearing loss, told his hearing was "gone" after testing at the [place]. No recent changes, ear fullness, or tinnitus. No vertigo. He thinks his right ear is worse than the left. He worked in [employer] as a teenager with loud noises.   He reports no problems taking metformin and has found it possible to enact lifestyle modifications. Down 7 lbs from our last visit. He checks his sugars which are in the 130s regularly fasting. He does not regularly check postprandial, but yesterday it was 150.   RA has been flaring up a bit, taking tramadol as needed for pain per rheumatologist. This has made exercise more difficult.   He is taking ASA, statin, and ACE inhibitor without issues.   - ROS: Denies fever, chills, unintentional weight loss, dizziness, vision changes, syncope, polyuria, nocturia, numbness, polydipsia, chest pain, new wounds.  - [place]: Employed, married, non-smoker, no EtOH, no illicit drugs. - Medications: reviewed and updated  Objective: BP 109/59 mmHg  Pulse 85  Temp(Src) 98.1 F (36.7 C) (Oral)  Ht 6\' 3"  (1.905 m)  Wt 236 lb (107.049 kg)  BMI 29.50 kg/m2 Gen: Well-appearing 65 y.o.male in no distress HEENT: Normocephalic, sclerae/conjunctivae clear, PERRL, MMM, TMs wnl bilaterally without effusion, posterior oropharynx clear, upper dentures Neck: Neck supple, no masses or lymphadenopathy; thyroid not enlarged  Pulm: Non-labored; CTAB, no wheezes  CV: Regular rate, no murmur appreciated; no LE edema, no JVD GI: Normoactive BS; soft,  non-tender, non-distended, no HSM Skin: No wounds or rashes, no acanthosis nigricans See diabetic foot exam - simple  Neuro: CN II-XII without deficits, sensation intact to light touch, steady gait. Subjectively decreased hearing bilaterally.   Assessment & Plan: ATIF Eric Holloway is a 65 y.o. male here for diabetes and lightheadedness/fatigue.    Lightheadedness/fatigue: No suggestion of vertigo, presyncope, hypovolemia, or drug reaction (though tramadol may contribute), but sleep deprivation is likely primary cause (getting no more than 5 hours of sleep per night.  - Reviewed sleep hygiene, motivational interviewing to elicit pt's plans to improve his sleep duration which he seems motivated to do.  - Will check BP at home, if hypotensive, will call for advice to decrease anti-HTN medications.   T2DM (type 2 diabetes mellitus) (HCC) Hb A1c at goal < 7%, will continue metformin and lifestyle modifications. Eye exam UTD, negative per pt earlier this week, will review and scan when available. - Follow up in 6 months.   Hearing loss of both ears Longstanding, worsening, pt requests referral for hearing aids. No significant asymmetry on exam today.  - Referral to audiology.   Healthcare maintenance: - Due for prevnar: Given today - Due for zostavax: On methotrexate, but less than 0.4mg /kg/week (he takes 20mg  total weekly dose and is 107kg), which is not considered sufficiently immunosuppressive to create vaccine safety concerns.

## 2015-11-28 NOTE — Assessment & Plan Note (Signed)
Hb A1c at goal < 7%, will continue metformin and lifestyle modifications. Eye exam UTD, negative per pt earlier this week, will review and scan when available. - Follow up in 6 months.

## 2015-11-28 NOTE — Patient Instructions (Signed)
We are referring you for audiology evaluation of your hearing loss. You should be contacted soon.   Try to get more sleep and stay hydrated, and if the lightheadedness doesn't improve, please return for further evaluation.   Follow up here in 6 months or earlier as needed.

## 2015-11-28 NOTE — Assessment & Plan Note (Addendum)
Longstanding, worsening, pt requests referral for hearing aids. No significant asymmetry on exam today.  - Referral to audiology.

## 2015-12-04 DIAGNOSIS — Z79899 Other long term (current) drug therapy: Secondary | ICD-10-CM | POA: Diagnosis not present

## 2015-12-04 DIAGNOSIS — M0570 Rheumatoid arthritis with rheumatoid factor of unspecified site without organ or systems involvement: Secondary | ICD-10-CM | POA: Diagnosis not present

## 2016-01-01 ENCOUNTER — Other Ambulatory Visit: Payer: Self-pay | Admitting: Family Medicine

## 2016-01-14 DIAGNOSIS — C61 Malignant neoplasm of prostate: Secondary | ICD-10-CM | POA: Diagnosis not present

## 2016-01-29 ENCOUNTER — Other Ambulatory Visit: Payer: Self-pay | Admitting: *Deleted

## 2016-01-29 MED ORDER — FLUTICASONE PROPIONATE 50 MCG/ACT NA SUSP: 2.0000 | Freq: Every day | NASAL | 6 refills | Status: DC

## 2016-02-10 ENCOUNTER — Other Ambulatory Visit: Payer: Self-pay | Admitting: *Deleted

## 2016-02-12 ENCOUNTER — Other Ambulatory Visit: Payer: Self-pay | Admitting: Student

## 2016-02-12 DIAGNOSIS — K219 Gastro-esophageal reflux disease without esophagitis: Secondary | ICD-10-CM

## 2016-02-12 MED ORDER — RANITIDINE HCL 150 MG PO TABS: 150.0000 mg | ORAL_TABLET | Freq: Two times a day (BID) | ORAL | 4 refills | Status: DC

## 2016-02-24 ENCOUNTER — Encounter: Payer: Self-pay | Admitting: Gastroenterology

## 2016-02-27 ENCOUNTER — Ambulatory Visit: Payer: Medicare Other | Attending: Family Medicine | Admitting: Audiology

## 2016-02-28 ENCOUNTER — Other Ambulatory Visit: Payer: Self-pay | Admitting: *Deleted

## 2016-02-28 MED ORDER — PANTOPRAZOLE SODIUM 40 MG PO TBEC: 40.0000 mg | DELAYED_RELEASE_TABLET | Freq: Every day | ORAL | 3 refills | Status: DC

## 2016-04-07 ENCOUNTER — Other Ambulatory Visit: Payer: Self-pay | Admitting: *Deleted

## 2016-04-07 DIAGNOSIS — M069 Rheumatoid arthritis, unspecified: Secondary | ICD-10-CM

## 2016-04-07 DIAGNOSIS — Z8546 Personal history of malignant neoplasm of prostate: Secondary | ICD-10-CM

## 2016-04-07 DIAGNOSIS — E118 Type 2 diabetes mellitus with unspecified complications: Secondary | ICD-10-CM

## 2016-04-08 MED ORDER — METFORMIN HCL 1000 MG PO TABS: 1000.0000 mg | ORAL_TABLET | Freq: Two times a day (BID) | ORAL | 3 refills | Status: DC

## 2016-04-08 MED ORDER — METHOTREXATE 2.5 MG PO TABS: 10.0000 mg | ORAL_TABLET | ORAL | 0 refills | Status: DC

## 2016-04-08 MED ORDER — TAMSULOSIN HCL 0.4 MG PO CAPS: 0.4000 mg | ORAL_CAPSULE | Freq: Every day | ORAL | 3 refills | Status: DC

## 2016-04-08 MED ORDER — ATORVASTATIN CALCIUM 40 MG PO TABS: 40.0000 mg | ORAL_TABLET | Freq: Every day | ORAL | 3 refills | Status: DC

## 2016-04-08 NOTE — Telephone Encounter (Signed)
Called patient in regards to his refill request I received from his mail order pharmacy on his medications particularly methotrexate that he takes for rheumatoid arthritis. Patient stated that he wasn't able to see his rheumatologist for the last 2 months due to insurance issue and high co-pay. His last blood work with [place] (BMP and CBC) was about 8 months ago. So, I advised patient to schedule an appointment with [place] to have his blood work as we need to monitor his CBC and CMP closely while he is on his methotrexate. Patient voiced understanding and agreed to do so. I authorized one refill on his methotrexate for now. I also authorized refills on his metformin, tamsulosin and atorvastatin

## 2016-04-16 ENCOUNTER — Encounter: Payer: Self-pay | Admitting: Gastroenterology

## 2016-04-22 ENCOUNTER — Other Ambulatory Visit: Payer: Self-pay | Admitting: Student

## 2016-04-22 NOTE — Telephone Encounter (Signed)
Pt would like a refill on lancets. Pt gave a part number [id number]. [facility] on [address]. Please advise. Thanks! ep

## 2016-04-27 NOTE — Telephone Encounter (Signed)
Patient doesn't need to check his blood sugar if he is only on metformin for his diabetes. Attempted to call but didn't answer. Please advise him.  Thanks!

## 2016-04-28 NOTE — Telephone Encounter (Signed)
Patient walked into clinic this afternoon requesting refill on lancets.  Patient does not understand why he cant' check his blood sugar.  Advised patient that PCP tried to call him yesterday and will try to contact this afternoon after he is done with clinic.  Derl Barrow, RN

## 2016-04-28 NOTE — Telephone Encounter (Signed)
Called and talked to patient about his refill on his lancet. Patient is only on metformin. His diabetes is well controlled. I don't think he needs to check his blood sugar. I tried to call him and discuss about this yesterday. However, patient didn't pick up his phone he walked into clinic this afternoon. The nurse tried to explain why he doesn't need to check his blood sugar but he could not understand why he doesn't need to check his blood sugar now. He says he has been checking his blood sugar for long time. I discussed about this with the patient. Patient voiced understanding and appreciated the call. He is also due for follow-up on his diabetes. He says he will call the front desk office and schedule an appointment.

## 2016-04-30 DIAGNOSIS — M0570 Rheumatoid arthritis with rheumatoid factor of unspecified site without organ or systems involvement: Secondary | ICD-10-CM | POA: Diagnosis not present

## 2016-04-30 DIAGNOSIS — M25511 Pain in right shoulder: Secondary | ICD-10-CM | POA: Diagnosis not present

## 2016-04-30 DIAGNOSIS — E118 Type 2 diabetes mellitus with unspecified complications: Secondary | ICD-10-CM | POA: Diagnosis not present

## 2016-04-30 DIAGNOSIS — M79642 Pain in left hand: Secondary | ICD-10-CM | POA: Diagnosis not present

## 2016-04-30 DIAGNOSIS — M79641 Pain in right hand: Secondary | ICD-10-CM | POA: Diagnosis not present

## 2016-05-18 DIAGNOSIS — E118 Type 2 diabetes mellitus with unspecified complications: Secondary | ICD-10-CM | POA: Diagnosis not present

## 2016-05-18 DIAGNOSIS — M0570 Rheumatoid arthritis with rheumatoid factor of unspecified site without organ or systems involvement: Secondary | ICD-10-CM | POA: Diagnosis not present

## 2016-05-18 DIAGNOSIS — Z79899 Other long term (current) drug therapy: Secondary | ICD-10-CM | POA: Diagnosis not present

## 2016-05-21 ENCOUNTER — Ambulatory Visit: Payer: Medicare Other | Admitting: Audiology

## 2016-06-10 ENCOUNTER — Ambulatory Visit (INDEPENDENT_AMBULATORY_CARE_PROVIDER_SITE_OTHER): Payer: Medicare Other | Admitting: Student

## 2016-06-10 VITALS — BP 102/60 | HR 75 | Temp 98.1°F | Ht 75.0 in | Wt 239.0 lb

## 2016-06-10 DIAGNOSIS — R42 Dizziness and giddiness: Secondary | ICD-10-CM

## 2016-06-10 DIAGNOSIS — Z79899 Other long term (current) drug therapy: Secondary | ICD-10-CM | POA: Diagnosis not present

## 2016-06-10 DIAGNOSIS — E118 Type 2 diabetes mellitus with unspecified complications: Secondary | ICD-10-CM | POA: Diagnosis not present

## 2016-06-10 DIAGNOSIS — I10 Essential (primary) hypertension: Secondary | ICD-10-CM | POA: Diagnosis not present

## 2016-06-10 LAB — CBC
HEMATOCRIT: 37.4 % — AB (ref 38.5–50.0)
HEMOGLOBIN: 12.1 g/dL — AB (ref 13.2–17.1)
MCH: 28.2 pg (ref 27.0–33.0)
MCHC: 32.4 g/dL (ref 32.0–36.0)
MCV: 87.2 fL (ref 80.0–100.0)
MPV: 10.1 fL (ref 7.5–12.5)
Platelets: 272 10*3/uL (ref 140–400)
RBC: 4.29 MIL/uL (ref 4.20–5.80)
RDW: 15.8 % — ABNORMAL HIGH (ref 11.0–15.0)
WBC: 8.3 10*3/uL (ref 3.8–10.8)

## 2016-06-10 LAB — POCT GLYCOSYLATED HEMOGLOBIN (HGB A1C): Hemoglobin A1C: 6.8

## 2016-06-10 LAB — BASIC METABOLIC PANEL WITH GFR
BUN: 19 mg/dL (ref 7–25)
CO2: 26 mmol/L (ref 20–31)
Calcium: 9.6 mg/dL (ref 8.6–10.3)
Chloride: 101 mmol/L (ref 98–110)
Creat: 1.1 mg/dL (ref 0.70–1.25)
GFR, EST NON AFRICAN AMERICAN: 70 mL/min (ref >=60)
GFR, Est African American: 80 mL/min (ref >=60)
GLUCOSE: 121 mg/dL — AB (ref 65–99)
POTASSIUM: 4.2 mmol/L (ref 3.5–5.3)
Sodium: 139 mmol/L (ref 135–146)

## 2016-06-10 LAB — VITAMIN B12: Vitamin B-12: 481 pg/mL (ref 200–1100)

## 2016-06-10 LAB — TSH: TSH: 1.96 mIU/L (ref 0.40–4.50)

## 2016-06-10 MED ORDER — LISINOPRIL 20 MG PO TABS: 20.0000 mg | ORAL_TABLET | Freq: Every day | ORAL | 3 refills | Status: DC

## 2016-06-10 MED ORDER — LISINOPRIL 20 MG PO TABS: 20.0000 mg | ORAL_TABLET | Freq: Every day | ORAL | 0 refills | Status: DC

## 2016-06-10 NOTE — Patient Instructions (Addendum)
It was great seeing you today! We have addressed the following issues today  1. Lightheadedness: This is likely due to your blood pressure which is on the lower side. I have changing your blood pressure medication from lisinopril/hydrochlorothiazide to plain lisinopril. I have sent one month supply to [facility] on [address]. I have also sent a 3 month supply with three refills to [facility]. I also recommend adequate hydration with at least 2 L of water a day. Please come back and see [place] in 2 weeks for follow-up on the blood pressure or sooner if you have chest pain, trouble breathing, worsening of your symptoms or other symptoms concerning to you.  2   Diabetes: Your A1c is 6.8. Your goal A1c is less than 7.0. Continue taking your metformin. Pleasesee your eye doctor as soon as possible    If we did any lab work today, and the results require attention, either me or my nurse will get in touch with you. If everything is normal, you will get a letter in mail. If you don't hear from [place] in two weeks, please give [place] a call. Otherwise, we look forward to seeing you again at your next visit. If you have any questions or concerns before then, please call the clinic at [phone].   Please bring all your medications to every doctors visit   Sign up for My Chart to have easy access to your labs results, and communication with your Primary care physician.     Please check-out at the front desk before leaving the clinic.    Take Care,

## 2016-06-10 NOTE — Assessment & Plan Note (Addendum)
Well controlled. A1c 6.8. No change to his metformin.  Will continue metformin, statin and asprin

## 2016-06-10 NOTE — Assessment & Plan Note (Addendum)
Likely due to hypovolemia or relatively low blood pressure. Symptom improve with eating and drinking. Doubt hypoglycemia. He is not on hypoglycemic medication. Orthostatic vital negative.  -Changed his blood pressure medicine from Lisinopril/HCTZ to Lisinopril.  -recommended good hydration, at least 2L of water a day -Check TSH, CBC and BMP -Follow up in two week for blood pressure

## 2016-06-10 NOTE — Progress Notes (Signed)
   Subjective:    Patient ID: Eric Holloway is a 65 y.o. old male. Patient scheduled apt for follow up on diabetes. However, he has a concern about his lightheadedness.  HPI #Lightheadedness: for one month. Describes it as being off balance. Denies vertigo, tinnitus or hearing changes. Denies new medicine or health change. Denies chest pain, palpation, dyspnea or orthopnea.  Lightheadedness improves with eating and drinking. No specific trigger. Drinks about 1-1.5 litters of water a day. Hasn't eaten his breakfast this morning but drinking fluid. He has a bottle of water with him. Didn't take any medicine this morning.   PMH: reviewed  SH: denies smoking cigarettes, drinking alcohol or using drugs.   Review of Systems  Constitutional: Positive for fatigue. Negative for appetite change, chills, diaphoresis, fever and unexpected weight change.  HENT: Negative for trouble swallowing.   Eyes: Positive for visual disturbance.       Cataract  Respiratory: Negative for cough, chest tightness, shortness of breath and wheezing.   Cardiovascular: Negative for chest pain.  Gastrointestinal: Negative for abdominal pain and blood in stool.  Endocrine: Negative for cold intolerance, heat intolerance, polydipsia, polyphagia and polyuria.  Genitourinary: Negative for dysuria and hematuria.  Musculoskeletal: Negative for myalgias.  Neurological: Positive for dizziness.  Hematological: Negative for adenopathy. Does not bruise/bleed easily.   Objective:   Vitals:   06/10/16 1020  BP: 102/60  Pulse: 75  Temp: 98.1 F (36.7 C)  TempSrc: Oral  SpO2: 98%  Weight: 239 lb (108.4 kg)  Height: 6\' 3"  (1.905 m)   Orthostatic VS for the past 24 hrs (Last 3 readings):  BP- Lying BP- Sitting BP- Standing at 0 minutes BP- Standing at 3 minutes  06/10/16 1059 122/68 110/58 108/60 118/68   GEN: appears well, no apparent distress. CVS: RRR, normal s1 and s2, no murmurs, no edema RESP: no increased work  of breathing, good air movement bilaterally, no crackles or wheeze GI: Bowel sounds present and normal, soft, non-tender,non-distended SKIN: no apparent skin lesion NEURO: alert and oriented appropriately, no gross defecits  PSYCH: appropriate mood and affect     Assessment & Plan:  Dizziness Likely due to hypovolemia or relatively low blood pressure. Symptom improve with eating and drinking. Doubt hypoglycemia. He is not on hypoglycemic medication. Orthostatic vital negative.  -Changed his blood pressure medicine from Lisinopril/HCTZ to Lisinopril.  -recommended good hydration, at least 2L of water a day -Check TSH, CBC and BMP -Follow up in two week for blood pressure  HYPERTENSION, BENIGN SYSTEMIC As above.  T2DM (type 2 diabetes mellitus) ([place]) Well controlled. A1c 6.8. No change to his metformin.  Will continue metformin, statin and asprin

## 2016-06-10 NOTE — Assessment & Plan Note (Signed)
As above.

## 2016-06-11 ENCOUNTER — Encounter: Payer: Self-pay | Admitting: Student

## 2016-06-11 ENCOUNTER — Ambulatory Visit (AMBULATORY_SURGERY_CENTER): Payer: Self-pay | Admitting: *Deleted

## 2016-06-11 ENCOUNTER — Telehealth: Payer: Self-pay | Admitting: Student

## 2016-06-11 VITALS — Ht 75.0 in | Wt 240.0 lb

## 2016-06-11 DIAGNOSIS — Z86010 Personal history of colonic polyps: Secondary | ICD-10-CM

## 2016-06-11 MED ORDER — NA SULFATE-K SULFATE-MG SULF 17.5-3.13-1.6 GM/177ML PO SOLN: 1.0000 | Freq: Once | ORAL | 0 refills | Status: AC

## 2016-06-11 NOTE — Progress Notes (Signed)
No egg or soy allergy known to patient  No issues with past sedation with any surgeries  or procedures, no intubation problems  No diet pills per patient No home 02 use per patient  No blood thinners per patient  Pt denies issues with constipation  No A fib or A flutter   

## 2016-06-11 NOTE — Progress Notes (Signed)
Results letter from his visit with me yesterday.  Called patient to discuss results (BMP, CBC, TSH and vitamin B12 level), which are normal except for mildly low hemoglobin to 12.1 (stable) suggestive for anemia of chronic disease (rheumatoid arthritis on methotrexate) with normal range MCV. There is no intervention needed at this time. Patient voiceds understanding.   Patient presented to clinic with lightheadedness yesterday. He says his lightheadedness has improved this morning. He states picking his new prescription (lisinopril) this morning.  Patient is appreciative  about the call

## 2016-06-12 ENCOUNTER — Encounter: Payer: Self-pay | Admitting: Gastroenterology

## 2016-06-12 NOTE — Telephone Encounter (Signed)
Opened by mistake. Already discussed about his results

## 2016-06-25 ENCOUNTER — Ambulatory Visit (AMBULATORY_SURGERY_CENTER): Payer: Medicare Other | Admitting: Gastroenterology

## 2016-06-25 ENCOUNTER — Encounter: Payer: Self-pay | Admitting: Gastroenterology

## 2016-06-25 VITALS — BP 133/61 | HR 72 | Temp 97.8°F | Resp 18 | Ht 75.0 in | Wt 240.0 lb

## 2016-06-25 DIAGNOSIS — Z86010 Personal history of colonic polyps: Secondary | ICD-10-CM

## 2016-06-25 DIAGNOSIS — Z1211 Encounter for screening for malignant neoplasm of colon: Secondary | ICD-10-CM | POA: Diagnosis not present

## 2016-06-25 LAB — GLUCOSE, CAPILLARY
GLUCOSE-CAPILLARY: 94 mg/dL (ref 65–99)
GLUCOSE-CAPILLARY: 90 mg/dL (ref 65–99)

## 2016-06-25 MED ORDER — SODIUM CHLORIDE 0.9 % IV SOLN: 500.0000 mL | INTRAVENOUS | Status: DC

## 2016-06-25 NOTE — Progress Notes (Signed)
A/ox3 pleased with MAC, report to Jane RN 

## 2016-06-25 NOTE — Op Note (Signed)
[place] Patient Name: Eric Holloway Procedure Date: 06/25/2016 8:22 AM MRN: [id number] Endoscopist: Mallie Mussel L. Loletha Carrow , MD Age: 65 Referring MD:  Date of Birth: [birth year] Gender: Male Account #: [id number] Procedure:                Colonoscopy Indications:              Surveillance: Personal history of adenomatous                            polyps on last colonoscopy 5 years ago Medicines:                Monitored Anesthesia Care Procedure:                Pre-Anesthesia Assessment:                           - Prior to the procedure, a History and Physical                            was performed, and patient medications and                            allergies were reviewed. The patient's tolerance of                            previous anesthesia was also reviewed. The risks                            and benefits of the procedure and the sedation                            options and risks were discussed with the patient.                            All questions were answered, and informed consent                            was obtained. Anticoagulants: The patient has taken                            aspirin. It was decided not to withhold this                            medication prior to the procedure. ASA Grade                            Assessment: II - A patient with mild systemic                            disease. After reviewing the risks and benefits,                            the patient was deemed in satisfactory condition to  undergo the procedure.                           After obtaining informed consent, the colonoscope                            was passed under direct vision. Throughout the                            procedure, the patient's blood pressure, pulse, and                            oxygen saturations were monitored continuously. The                            Model CF-HQ190L [id number]) scope was introduced                             through the anus and advanced to the the cecum,                            identified by appendiceal orifice and ileocecal                            valve. The colonoscopy was performed without                            difficulty. The patient tolerated the procedure                            well. The quality of the bowel preparation was good                            after lavage. The ileocecal valve, appendiceal                            orifice, and rectum were photographed. The bowel                            preparation used was SUPREP. Scope In: 8:29:11 AM Scope Out: 8:49:16 AM Scope Withdrawal Time: 0 hours 12 minutes 0 seconds  Total Procedure Duration: 0 hours 20 minutes 5 seconds  Findings:                 The perianal and digital rectal examinations were                            normal.                           The sigmoid colon was moderately redundant.                           Internal hemorrhoids were found during  retroflexion. The hemorrhoids were small and Grade                            I (internal hemorrhoids that do not prolapse).                           The exam was otherwise without abnormality on                            direct and retroflexion views. Complications:            No immediate complications. Estimated Blood Loss:     Estimated blood loss: none. Impression:               - Redundant colon.                           - Internal hemorrhoids.                           - The examination was otherwise normal on direct                            and retroflexion views.                           - No specimens collected. Recommendation:           - Patient has a contact number available for                            emergencies. The signs and symptoms of potential                            delayed complications were discussed with the                            patient. Return to normal  activities tomorrow.                            Written discharge instructions were provided to the                            patient.                           - Resume previous diet.                           - Continue present medications.                           - Repeat colonoscopy in 10 years for surveillance. [name], MD 06/25/2016 8:53:14 AM This report has been signed electronically.

## 2016-06-25 NOTE — Patient Instructions (Signed)
Impression/Recommendations:  Hemorrhoid handout given to patient.  Repeat colonoscopy in 10 years for surveillance.  YOU HAD AN ENDOSCOPIC PROCEDURE TODAY AT [facility]:   Refer to the procedure report that was given to you for any specific questions about what was found during the examination.  If the procedure report does not answer your questions, please call your gastroenterologist to clarify.  If you requested that your care partner not be given the details of your procedure findings, then the procedure report has been included in a sealed envelope for you to review at your convenience later.  YOU SHOULD EXPECT: Some feelings of bloating in the abdomen. Passage of more gas than usual.  Walking can help get rid of the air that was put into your GI tract during the procedure and reduce the bloating. If you had a lower endoscopy (such as a colonoscopy or flexible sigmoidoscopy) you may notice spotting of blood in your stool or on the toilet paper. If you underwent a bowel prep for your procedure, you may not have a normal bowel movement for a few days.  Please Note:  You might notice some irritation and congestion in your nose or some drainage.  This is from the oxygen used during your procedure.  There is no need for concern and it should clear up in a day or so.  SYMPTOMS TO REPORT IMMEDIATELY:   Following lower endoscopy (colonoscopy or flexible sigmoidoscopy):  Excessive amounts of blood in the stool  Significant tenderness or worsening of abdominal pains  Swelling of the abdomen that is new, acute  Fever of 100F or higher For urgent or emergent issues, a gastroenterologist can be reached at any hour by calling [phone].   DIET:  We do recommend a small meal at first, but then you may proceed to your regular diet.  Drink plenty of fluids but you should avoid alcoholic beverages for 24 hours.  ACTIVITY:  You should plan to take it easy for the rest of today and  you should NOT DRIVE or use heavy machinery until tomorrow (because of the sedation medicines used during the test).    FOLLOW UP: Our staff will call the number listed on your records the next business day following your procedure to check on you and address any questions or concerns that you may have regarding the information given to you following your procedure. If we do not reach you, we will leave a message.  However, if you are feeling well and you are not experiencing any problems, there is no need to return our call.  We will assume that you have returned to your regular daily activities without incident.  If any biopsies were taken you will be contacted by phone or by letter within the next 1-3 weeks.  Please call us at [phone] if you have not heard about the biopsies in 3 weeks.    SIGNATURES/CONFIDENTIALITY: You and/or your care partner have signed paperwork which will be entered into your electronic medical record.  These signatures attest to the fact that that the information above on your After Visit Summary has been reviewed and is understood.  Full responsibility of the confidentiality of this discharge information lies with you and/or your care-partner.

## 2016-06-26 ENCOUNTER — Telehealth: Payer: Self-pay

## 2016-06-26 ENCOUNTER — Telehealth: Payer: Self-pay | Admitting: *Deleted

## 2016-06-26 NOTE — Telephone Encounter (Signed)
  Follow up Call-  Call back number 06/25/2016  Post procedure Call Back phone  # [phone]  Permission to leave phone message Yes  Some recent data might be hidden    [facility]

## 2016-06-26 NOTE — Telephone Encounter (Signed)
  Follow up Call-  Call back number 06/25/2016  Post procedure Call Back phone  # [phone]  Permission to leave phone message Yes  Some recent data might be hidden    Patient was called for follow up after his procedure on 06/25/2016. No answer at the number given for follow up phone call. A message was left on the answering machine.

## 2016-08-10 DIAGNOSIS — Z79899 Other long term (current) drug therapy: Secondary | ICD-10-CM | POA: Diagnosis not present

## 2016-08-10 DIAGNOSIS — M0570 Rheumatoid arthritis with rheumatoid factor of unspecified site without organ or systems involvement: Secondary | ICD-10-CM | POA: Diagnosis not present

## 2016-08-28 ENCOUNTER — Telehealth: Payer: Self-pay | Admitting: Student

## 2016-09-29 ENCOUNTER — Ambulatory Visit (INDEPENDENT_AMBULATORY_CARE_PROVIDER_SITE_OTHER): Payer: Medicare Other | Admitting: *Deleted

## 2016-09-29 ENCOUNTER — Encounter: Payer: Self-pay | Admitting: *Deleted

## 2016-09-29 VITALS — BP 150/80 | HR 72 | Temp 98.8°F | Ht 75.0 in | Wt 249.8 lb

## 2016-09-29 DIAGNOSIS — Z0000 Encounter for general adult medical examination without abnormal findings: Secondary | ICD-10-CM | POA: Diagnosis not present

## 2016-09-29 DIAGNOSIS — E118 Type 2 diabetes mellitus with unspecified complications: Secondary | ICD-10-CM

## 2016-09-29 LAB — POCT GLYCOSYLATED HEMOGLOBIN (HGB A1C): Hemoglobin A1C: 7.2
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
## 2016-09-29 NOTE — Patient Instructions (Addendum)
 Diabetes and Foot Care Diabetes may cause you to have problems because of poor blood supply (circulation) to your feet and legs. This may cause the skin on your feet to become thinner, break easier, and heal more slowly. Your skin may become dry, and the skin may peel and crack. You may also have nerve damage in your legs and feet causing decreased feeling in them. You may not notice minor injuries to your feet that could lead to infections or more serious problems. Taking care of your feet is one of the most important things you can do for yourself. Follow these instructions at home:  Wear shoes at all times, even in the house. Do not go barefoot. Bare feet are easily injured.  Check your feet daily for blisters, cuts, and redness. If you cannot see the bottom of your feet, use a mirror or ask someone for help.  Wash your feet with warm water (do not use hot water) and mild soap. Then pat your feet and the areas between your toes until they are completely dry. Do not soak your feet as this can dry your skin.  Apply a moisturizing lotion or petroleum jelly (that does not contain alcohol and is unscented) to the skin on your feet and to dry, brittle toenails. Do not apply lotion between your toes.  Trim your toenails straight across. Do not dig under them or around the cuticle. File the edges of your nails with an emery board or nail file.  Do not cut corns or calluses or try to remove them with medicine.  Wear clean socks or stockings every day. Make sure they are not too tight. Do not wear knee-high stockings since they may decrease blood flow to your legs.  Wear shoes that fit properly and have enough cushioning. To break in new shoes, wear them for just a few hours a day. This prevents you from injuring your feet. Always look in your shoes before you put them on to be sure there are no objects inside.  Do not cross your legs. This may decrease the blood flow to your feet.  If you find a  minor scrape, cut, or break in the skin on your feet, keep it and the skin around it clean and dry. These areas may be cleansed with mild soap and water. Do not cleanse the area with peroxide, alcohol, or iodine.  When you remove an adhesive bandage, be sure not to damage the skin around it.  If you have a wound, look at it several times a day to make sure it is healing.  Do not use heating pads or hot water bottles. They may burn your skin. If you have lost feeling in your feet or legs, you may not know it is happening until it is too late.  Make sure your health care provider performs a complete foot exam at least annually or more often if you have foot problems. Report any cuts, sores, or bruises to your health care provider immediately. Contact a health care provider if:  You have an injury that is not healing.  You have cuts or breaks in the skin.  You have an ingrown nail.  You notice redness on your legs or feet.  You feel burning or tingling in your legs or feet.  You have pain or cramps in your legs and feet.  Your legs or feet are numb.  Your feet always feel cold. Get help right away if:  There is   increasing redness, swelling, or pain in or around a wound.  There is a red line that goes up your leg.  Pus is coming from a wound.  You develop a fever or as directed by your health care provider.  You notice a bad smell coming from an ulcer or wound. This information is not intended to replace advice given to you by your health care provider. Make sure you discuss any questions you have with your health care provider. Document Released: 06/19/2000 Document Revised: 11/28/2015 Document Reviewed: 11/29/2012 Elsevier Interactive Patient Education  2017 [place] Prevention in the Home Falls can cause injuries. They can happen to people of all ages. There are many things you can do to make your home safe and to help prevent falls. What can I do on the  outside of my home?  Regularly fix the edges of walkways and driveways and fix any cracks.  Remove anything that might make you trip as you walk through a door, such as a raised step or threshold.  Trim any bushes or trees on the path to your home.  Use bright outdoor lighting.  Clear any walking paths of anything that might make someone trip, such as rocks or tools.  Regularly check to see if handrails are loose or broken. Make sure that both sides of any steps have handrails.  Any raised decks and porches should have guardrails on the edges.  Have any leaves, snow, or ice cleared regularly.  Use sand or salt on walking paths during winter.  Clean up any spills in your garage right away. This includes oil or grease spills. What can I do in the bathroom?  Use night lights.  Install grab bars by the toilet and in the tub and shower. Do not use towel bars as grab bars.  Use non-skid mats or decals in the tub or shower.  If you need to sit down in the shower, use a plastic, non-slip stool.  Keep the floor dry. Clean up any water that spills on the floor as soon as it happens.  Remove soap buildup in the tub or shower regularly.  Attach bath mats securely with double-sided non-slip rug tape.  Do not have throw rugs and other things on the floor that can make you trip. What can I do in the bedroom?  Use night lights.  Make sure that you have a light by your bed that is easy to reach.  Do not use any sheets or blankets that are too big for your bed. They should not hang down onto the floor.  Have a firm chair that has side arms. You can use this for support while you get dressed.  Do not have throw rugs and other things on the floor that can make you trip. What can I do in the kitchen?  Clean up any spills right away.  Avoid walking on wet floors.  Keep items that you use a lot in easy-to-reach places.  If you need to reach something above you, use a strong step  stool that has a grab bar.  Keep electrical cords out of the way.  Do not use floor polish or wax that makes floors slippery. If you must use wax, use non-skid floor wax.  Do not have throw rugs and other things on the floor that can make you trip. What can I do with my stairs?  Do not leave any items on the stairs.  Make sure that there  are handrails on both sides of the stairs and use them. Fix handrails that are broken or loose. Make sure that handrails are as long as the stairways.  Check any carpeting to make sure that it is firmly attached to the stairs. Fix any carpet that is loose or worn.  Avoid having throw rugs at the top or bottom of the stairs. If you do have throw rugs, attach them to the floor with carpet tape.  Make sure that you have a light switch at the top of the stairs and the bottom of the stairs. If you do not have them, ask someone to add them for you. What else can I do to help prevent falls?  Wear shoes that:  Do not have high heels.  Have rubber bottoms.  Are comfortable and fit you well.  Are closed at the toe. Do not wear sandals.  If you use a stepladder:  Make sure that it is fully opened. Do not climb a closed stepladder.  Make sure that both sides of the stepladder are locked into place.  Ask someone to hold it for you, if possible.  Clearly mark and make sure that you can see:  Any grab bars or handrails.  First and last steps.  Where the edge of each step is.  Use tools that help you move around (mobility aids) if they are needed. These include:  Canes.  Walkers.  Scooters.  Crutches.  Turn on the lights when you go into a dark area. Replace any light bulbs as soon as they burn out.  Set up your furniture so you have a clear path. Avoid moving your furniture around.  If any of your floors are uneven, fix them.  If there are any pets around you, be aware of where they are.  Review your medicines with your doctor. Some  medicines can make you feel dizzy. This can increase your chance of falling. Ask your doctor what other things that you can do to help prevent falls. This information is not intended to replace advice given to you by your health care provider. Make sure you discuss any questions you have with your health care provider. Document Released: 04/18/2009 Document Revised: 11/28/2015 Document Reviewed: 07/27/2014 Elsevier Interactive Patient Education  2017 [place] Maintenance, Male A healthy lifestyle and preventive care is important for your health and wellness. Ask your health care provider about what schedule of regular examinations is right for you. What should I know about weight and diet?  Eat a Healthy Diet  Eat plenty of vegetables, fruits, whole grains, low-fat dairy products, and lean protein.  Do not eat a lot of foods high in solid fats, added sugars, or salt. Maintain a Healthy Weight  Regular exercise can help you achieve or maintain a healthy weight. You should:  Do at least 150 minutes of exercise each week. The exercise should increase your heart rate and make you sweat (moderate-intensity exercise).  Do strength-training exercises at least twice a week. Watch Your Levels of Cholesterol and Blood Lipids  Have your blood tested for lipids and cholesterol every 5 years starting at 67 years of age. If you are at high risk for heart disease, you should start having your blood tested when you are 67 years old. You may need to have your cholesterol levels checked more often if:  Your lipid or cholesterol levels are high.  You are older than 67 years of age.  You are at high  risk for heart disease. What should I know about cancer screening? Many types of cancers can be detected early and may often be prevented. Lung Cancer  You should be screened every year for lung cancer if:  You are a current smoker who has smoked for at least 30 years.  You are a former  smoker who has quit within the past 15 years.  Talk to your health care provider about your screening options, when you should start screening, and how often you should be screened. Colorectal Cancer  Routine colorectal cancer screening usually begins at 67 years of age and should be repeated every 5-10 years until you are 67 years old. You may need to be screened more often if early forms of precancerous polyps or small growths are found. Your health care provider may recommend screening at an earlier age if you have risk factors for colon cancer.  Your health care provider may recommend using home test kits to check for hidden blood in the stool.  A small camera at the end of a tube can be used to examine your colon (sigmoidoscopy or colonoscopy). This checks for the earliest forms of colorectal cancer. Prostate and Testicular Cancer  Depending on your age and overall health, your health care provider may do certain tests to screen for prostate and testicular cancer.  Talk to your health care provider about any symptoms or concerns you have about testicular or prostate cancer. Skin Cancer  Check your skin from head to toe regularly.  Tell your health care provider about any new moles or changes in moles, especially if:  There is a change in a mole's size, shape, or color.  You have a mole that is larger than a pencil eraser.  Always use sunscreen. Apply sunscreen liberally and repeat throughout the day.  Protect yourself by wearing long sleeves, pants, a wide-brimmed hat, and sunglasses when outside. What should I know about heart disease, diabetes, and high blood pressure?  If you are 17-58 years of age, have your blood pressure checked every 3-5 years. If you are 46 years of age or older, have your blood pressure checked every year. You should have your blood pressure measured twice-once when you are at a hospital or clinic, and once when you are not at a hospital or clinic. Record  the average of the two measurements. To check your blood pressure when you are not at a hospital or clinic, you can use:  An automated blood pressure machine at a pharmacy.  A home blood pressure monitor.  Talk to your health care provider about your target blood pressure.  If you are between 34-39 years old, ask your health care provider if you should take aspirin to prevent heart disease.  Have regular diabetes screenings by checking your fasting blood sugar level.  If you are at a normal weight and have a low risk for diabetes, have this test once every three years after the age of 80.  If you are overweight and have a high risk for diabetes, consider being tested at a younger age or more often.  A one-time screening for abdominal aortic aneurysm (AAA) by ultrasound is recommended for men aged 38-75 years who are current or former smokers. What should I know about preventing infection? Hepatitis B  If you have a higher risk for hepatitis B, you should be screened for this virus. Talk with your health care provider to find out if you are at risk for hepatitis B infection.  Hepatitis C  Blood testing is recommended for:  Everyone born from [birth year] through 1966.  Anyone with known risk factors for hepatitis C. Sexually Transmitted Diseases (STDs)  You should be screened each year for STDs including gonorrhea and chlamydia if:  You are sexually active and are younger than 67 years of age.  You are older than 67 years of age and your health care provider tells you that you are at risk for this type of infection.  Your sexual activity has changed since you were last screened and you are at an increased risk for chlamydia or gonorrhea. Ask your health care provider if you are at risk.  Talk with your health care provider about whether you are at high risk of being infected with HIV. Your health care provider may recommend a prescription medicine to help prevent HIV infection. What else  can I do?  Schedule regular health, dental, and eye exams.  Stay current with your vaccines (immunizations).  Do not use any tobacco products, such as cigarettes, chewing tobacco, and e-cigarettes. If you need help quitting, ask your health care provider.  Limit alcohol intake to no more than 2 drinks per day. One drink equals 12 ounces of beer, 5 ounces of wine, or 1 ounces of hard liquor.  Do not use street drugs.  Do not share needles.  Ask your health care provider for help if you need support or information about quitting drugs.  Tell your health care provider if you often feel depressed.  Tell your health care provider if you have ever been abused or do not feel safe at home. This information is not intended to replace advice given to you by your health care provider. Make sure you discuss any questions you have with your health care provider. Document Released: 12/19/2007 Document Revised: 02/19/2016 Document Reviewed: 03/26/2015 Elsevier Interactive Patient Education  2017 [employer].

## 2016-10-19 ENCOUNTER — Ambulatory Visit: Payer: Medicare Other | Admitting: Student

## 2016-10-26 ENCOUNTER — Telehealth: Payer: Self-pay | Admitting: Student

## 2016-10-26 NOTE — Telephone Encounter (Signed)
Pt dropped off medical records from prior rheumatologist, pt would like a referral for a new rheumatologist. Paperwork is in white team folder. cs

## 2016-10-27 ENCOUNTER — Other Ambulatory Visit: Payer: Self-pay | Admitting: Student

## 2016-10-27 DIAGNOSIS — M069 Rheumatoid arthritis, unspecified: Secondary | ICD-10-CM

## 2016-10-27 NOTE — Telephone Encounter (Signed)
Papers placed in PCP box, no clinical information needing to be completed. Katharina Caper, Kaelin Holford D, [place]

## 2016-10-27 NOTE — Progress Notes (Signed)
Referral to new Rheumatologist placed. Thanks! Eric Holloway

## 2016-10-27 NOTE — Telephone Encounter (Signed)
Done

## 2016-10-28 NOTE — Telephone Encounter (Signed)
Dr. Cyndia Skeeters,  I will need those records in order to process this referral. I did not see them in your box. If you could, please give them to me so I can make copies to send with pt's referral.

## 2016-10-28 NOTE — Telephone Encounter (Signed)
It is in my box, lower shelf.  Sorry! Bretta Bang

## 2016-11-11 ENCOUNTER — Other Ambulatory Visit: Payer: Self-pay | Admitting: *Deleted

## 2016-11-13 NOTE — Telephone Encounter (Signed)
Talked to patient. He is only on Metformin. No need of checking CBG. Patient on board with this.

## 2016-11-13 NOTE — Telephone Encounter (Signed)
2nd request.  Martin, Tamika L, RN  

## 2016-11-26 ENCOUNTER — Telehealth: Payer: Self-pay | Admitting: Student

## 2016-11-26 DIAGNOSIS — E119 Type 2 diabetes mellitus without complications: Secondary | ICD-10-CM | POA: Diagnosis not present

## 2016-11-26 DIAGNOSIS — M069 Rheumatoid arthritis, unspecified: Secondary | ICD-10-CM

## 2016-11-26 DIAGNOSIS — H25013 Cortical age-related cataract, bilateral: Secondary | ICD-10-CM | POA: Diagnosis not present

## 2016-11-26 DIAGNOSIS — H524 Presbyopia: Secondary | ICD-10-CM | POA: Diagnosis not present

## 2016-11-26 DIAGNOSIS — H2513 Age-related nuclear cataract, bilateral: Secondary | ICD-10-CM | POA: Diagnosis not present

## 2016-11-26 MED ORDER — METHOTREXATE 2.5 MG PO TABS: 10.0000 mg | ORAL_TABLET | ORAL | 0 refills | Status: DC

## 2016-11-26 NOTE — Telephone Encounter (Signed)
Refilled his methotrexate until he sees his new rheumatologist. Patient's old rheumatologist retired recently.

## 2016-11-26 NOTE — Telephone Encounter (Signed)
Patient came by office request refill on RA RX methotrexate. (30 day supply) Patient is out of med and does not have appt. With RA doctor for ways out. Any questions patient may  Be reached at [phone]. Please let patient know when complete. Pharmacy: [facility]

## 2016-12-21 DIAGNOSIS — M67911 Unspecified disorder of synovium and tendon, right shoulder: Secondary | ICD-10-CM | POA: Insufficient documentation
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
## 2016-12-25 ENCOUNTER — Ambulatory Visit (INDEPENDENT_AMBULATORY_CARE_PROVIDER_SITE_OTHER): Payer: Self-pay

## 2016-12-25 ENCOUNTER — Ambulatory Visit (INDEPENDENT_AMBULATORY_CARE_PROVIDER_SITE_OTHER): Payer: Medicare Other | Admitting: Rheumatology

## 2016-12-25 ENCOUNTER — Encounter: Payer: Self-pay | Admitting: Rheumatology

## 2016-12-25 VITALS — BP 145/76 | HR 79 | Resp 14 | Ht 75.0 in | Wt 245.0 lb

## 2016-12-25 DIAGNOSIS — M0579 Rheumatoid arthritis with rheumatoid factor of multiple sites without organ or systems involvement: Secondary | ICD-10-CM

## 2016-12-25 DIAGNOSIS — M25512 Pain in left shoulder: Secondary | ICD-10-CM

## 2016-12-25 DIAGNOSIS — G8929 Other chronic pain: Secondary | ICD-10-CM

## 2016-12-25 DIAGNOSIS — E782 Mixed hyperlipidemia: Secondary | ICD-10-CM

## 2016-12-25 DIAGNOSIS — E118 Type 2 diabetes mellitus with unspecified complications: Secondary | ICD-10-CM

## 2016-12-25 DIAGNOSIS — M79641 Pain in right hand: Secondary | ICD-10-CM | POA: Diagnosis not present

## 2016-12-25 DIAGNOSIS — I10 Essential (primary) hypertension: Secondary | ICD-10-CM

## 2016-12-25 DIAGNOSIS — M47816 Spondylosis without myelopathy or radiculopathy, lumbar region: Secondary | ICD-10-CM

## 2016-12-25 DIAGNOSIS — M5030 Other cervical disc degeneration, unspecified cervical region: Secondary | ICD-10-CM

## 2016-12-25 DIAGNOSIS — Z794 Long term (current) use of insulin: Secondary | ICD-10-CM

## 2016-12-25 DIAGNOSIS — Z8546 Personal history of malignant neoplasm of prostate: Secondary | ICD-10-CM

## 2016-12-25 DIAGNOSIS — M79671 Pain in right foot: Secondary | ICD-10-CM

## 2016-12-25 DIAGNOSIS — M79642 Pain in left hand: Secondary | ICD-10-CM | POA: Diagnosis not present

## 2016-12-25 DIAGNOSIS — M25511 Pain in right shoulder: Secondary | ICD-10-CM | POA: Diagnosis not present

## 2016-12-25 DIAGNOSIS — Z79899 Other long term (current) drug therapy: Secondary | ICD-10-CM | POA: Diagnosis not present

## 2016-12-25 DIAGNOSIS — M79672 Pain in left foot: Secondary | ICD-10-CM | POA: Diagnosis not present

## 2016-12-25 DIAGNOSIS — M47812 Spondylosis without myelopathy or radiculopathy, cervical region: Secondary | ICD-10-CM

## 2016-12-25 LAB — CBC WITH DIFFERENTIAL/PLATELET
BASOS ABS: 0 {cells}/uL (ref 0–200)
BASOS PCT: 0 %
EOS ABS: 67 {cells}/uL (ref 15–500)
Eosinophils Relative: 1 %
HEMATOCRIT: 38.6 % (ref 38.5–50.0)
Hemoglobin: 12.3 g/dL — ABNORMAL LOW (ref 13.2–17.1)
LYMPHS PCT: 21 %
Lymphs Abs: 1407 cells/uL (ref 850–3900)
MCH: 28.2 pg (ref 27.0–33.0)
MCHC: 31.9 g/dL — AB (ref 32.0–36.0)
MCV: 88.5 fL (ref 80.0–100.0)
MONO ABS: 469 {cells}/uL (ref 200–950)
MONOS PCT: 7 %
MPV: 10.2 fL (ref 7.5–12.5)
NEUTROS PCT: 71 %
Neutro Abs: 4757 cells/uL (ref 1500–7800)
PLATELETS: 230 10*3/uL (ref 140–400)
RBC: 4.36 MIL/uL (ref 4.20–5.80)
RDW: 15.5 % — ABNORMAL HIGH (ref 11.0–15.0)
WBC: 6.7 10*3/uL (ref 3.8–10.8)

## 2016-12-25 LAB — COMPLETE METABOLIC PANEL WITH GFR
ALT: 12 U/L (ref 9–46)
AST: 14 U/L (ref 10–35)
Albumin: 3.6 g/dL (ref 3.6–5.1)
Alkaline Phosphatase: 58 U/L (ref 40–115)
BUN: 11 mg/dL (ref 7–25)
CALCIUM: 9.2 mg/dL (ref 8.6–10.3)
CHLORIDE: 105 mmol/L (ref 98–110)
CO2: 25 mmol/L (ref 20–31)
CREATININE: 1.03 mg/dL (ref 0.70–1.25)
GFR, EST AFRICAN AMERICAN: 86 mL/min (ref >=60)
GFR, EST NON AFRICAN AMERICAN: 75 mL/min (ref >=60)
Glucose, Bld: 90 mg/dL (ref 65–99)
POTASSIUM: 4.3 mmol/L (ref 3.5–5.3)
Sodium: 140 mmol/L (ref 135–146)
Total Bilirubin: 0.5 mg/dL (ref 0.2–1.2)
Total Protein: 6.4 g/dL (ref 6.1–8.1)

## 2016-12-25 MED ORDER — FOLIC ACID 1 MG PO TABS: 2.0000 mg | ORAL_TABLET | Freq: Every day | ORAL | 3 refills | Status: DC

## 2016-12-25 NOTE — Patient Instructions (Addendum)
Standing Labs We placed an order today for your standing lab work.    Please come back and get your standing labs in September and every 3 months  We have open lab Monday through Friday from 8:30-11:30 AM and 1:30-4 PM at the office of Dr. Tresa Moore, PA.   The office is located at [address], [place], [place], [place] [address] No appointment is necessary.   Labs are drawn by [facility].  You may receive a bill from [facility] for your lab work. If you have any questions regarding directions or hours of operation,  please call [phone].    Methotrexate tablets What is this medicine? METHOTREXATE (METH oh TREX ate) is a chemotherapy drug used to treat cancer including breast cancer, leukemia, and lymphoma. This medicine can also be used to treat psoriasis and certain kinds of arthritis. This medicine may be used for other purposes; ask your health care provider or pharmacist if you have questions. COMMON BRAND NAME(S): Rheumatrex, Trexall What should I tell my health care provider before I take this medicine? They need to know if you have any of these conditions: -fluid in the stomach area or lungs -if you often drink alcohol -infection or immune system problems -kidney disease or on hemodialysis -liver disease -low blood counts, like low white cell, platelet, or red cell counts -lung disease -radiation therapy -stomach ulcers -ulcerative colitis -an unusual or allergic reaction to methotrexate, other medicines, foods, dyes, or preservatives -pregnant or trying to get pregnant -breast-feeding How should I use this medicine? Take this medicine by mouth with a glass of water. Follow the directions on the prescription label. Take your medicine at regular intervals. Do not take it more often than directed. Do not stop taking except on your doctor's advice. Make sure you know why you are taking this medicine and how often you should take it. If this medicine is  used for a condition that is not cancer, like arthritis or psoriasis, it should be taken weekly, NOT daily. Taking this medicine more often than directed can cause serious side effects, even death. Talk to your healthcare provider about safe handling and disposal of this medicine. You may need to take special precautions. Talk to your pediatrician regarding the use of this medicine in children. While this drug may be prescribed for selected conditions, precautions do apply. Overdosage: If you think you have taken too much of this medicine contact a poison control center or emergency room at once. NOTE: This medicine is only for you. Do not share this medicine with others. What if I miss a dose? If you miss a dose, talk with your doctor or health care professional. Do not take double or extra doses. What may interact with this medicine? This medicine may interact with the following medication: -acitretin -aspirin and aspirin-like medicines including salicylates -azathioprine -certain antibiotics like penicillins, tetracycline, and chloramphenicol -cyclosporine -gold -hydroxychloroquine -live virus vaccines -NSAIDs, medicines for pain and inflammation, like ibuprofen or naproxen -other cytotoxic agents -penicillamine -phenylbutazone -phenytoin -probenecid -retinoids such as isotretinoin and tretinoin -steroid medicines like prednisone or cortisone -sulfonamides like sulfasalazine and trimethoprim/sulfamethoxazole -theophylline This list may not describe all possible interactions. Give your health care provider a list of all the medicines, herbs, non-prescription drugs, or dietary supplements you use. Also tell them if you smoke, drink alcohol, or use illegal drugs. Some items may interact with your medicine. What should I watch for while using this medicine? Avoid alcoholic drinks. This medicine can make you more  sensitive to the sun. Keep out of the sun. If you cannot avoid being in the  sun, wear protective clothing and use sunscreen. Do not use sun lamps or tanning beds/booths. You may need blood work done while you are taking this medicine. Call your doctor or health care professional for advice if you get a fever, chills or sore throat, or other symptoms of a cold or flu. Do not treat yourself. This drug decreases your body's ability to fight infections. Try to avoid being around people who are sick. This medicine may increase your risk to bruise or bleed. Call your doctor or health care professional if you notice any unusual bleeding. Check with your doctor or health care professional if you get an attack of severe diarrhea, nausea and vomiting, or if you sweat a lot. The loss of too much body fluid can make it dangerous for you to take this medicine. Talk to your doctor about your risk of cancer. You may be more at risk for certain types of cancers if you take this medicine. Both men and women must use effective birth control with this medicine. Do not become pregnant while taking this medicine or until at least 1 normal menstrual cycle has occurred after stopping it. Women should inform their doctor if they wish to become pregnant or think they might be pregnant. Men should not father a child while taking this medicine and for 3 months after stopping it. There is a potential for serious side effects to an unborn child. Talk to your health care professional or pharmacist for more information. Do not breast-feed an infant while taking this medicine. What side effects may I notice from receiving this medicine? Side effects that you should report to your doctor or health care professional as soon as possible: -allergic reactions like skin rash, itching or hives, swelling of the face, lips, or tongue -breathing problems or shortness of breath -diarrhea -dry, nonproductive cough -low blood counts - this medicine may decrease the number of white blood cells, red blood cells and  platelets. You may be at increased risk for infections and bleeding. -mouth sores -redness, blistering, peeling or loosening of the skin, including inside the mouth -signs of infection - fever or chills, cough, sore throat, pain or trouble passing urine -signs and symptoms of bleeding such as bloody or black, tarry stools; red or dark-brown urine; spitting up blood or brown material that looks like coffee grounds; red spots on the skin; unusual bruising or bleeding from the eye, gums, or nose -signs and symptoms of kidney injury like trouble passing urine or change in the amount of urine -signs and symptoms of liver injury like dark yellow or brown urine; general ill feeling or flu-like symptoms; light-colored stools; loss of appetite; nausea; right upper belly pain; unusually weak or tired; yellowing of the eyes or skin Side effects that usually do not require medical attention (report to your doctor or health care professional if they continue or are bothersome): -dizziness -hair loss -tiredness -upset stomach -vomiting This list may not describe all possible side effects. Call your doctor for medical advice about side effects. You may report side effects to FDA at 1-800-FDA-1088. Where should I keep my medicine? Keep out of the reach of children. Store at room temperature between 20 and 25 degrees C (68 and 77 degrees F). Protect from light. Throw away any unused medicine after the expiration date. NOTE: This sheet is a summary. It may not cover all possible information. If  you have questions about this medicine, talk to your doctor, pharmacist, or health care provider.  2018 Elsevier/Gold Standard (2015-02-25 05:39:22)

## 2016-12-25 NOTE — Progress Notes (Signed)
Pharmacy Note  Subjective: Patient presents today to the [facility] to see Dr. Estanislado Pandy.  Patient is currently taking methotrexate 8 tablets weekly prescribed by previous rheumatologist.  Plan its to continue methotrexate at this time.  Patient seen by the pharmacist for counseling on methotrexate.    Objective: CBC    Component Value Date/Time   WBC 8.3 06/10/2016 1048   RBC 4.29 06/10/2016 1048   HGB 12.1 (L) 06/10/2016 1048   HCT 37.4 (L) 06/10/2016 1048   PLT 272 06/10/2016 1048   MCV 87.2 06/10/2016 1048   MCH 28.2 06/10/2016 1048   MCHC 32.4 06/10/2016 1048   RDW 15.8 (H) 06/10/2016 1048   CMP     Component Value Date/Time   NA 139 06/10/2016 1140   K 4.2 06/10/2016 1140   CL 101 06/10/2016 1140   CO2 26 06/10/2016 1140   GLUCOSE 121 (H) 06/10/2016 1140   BUN 19 06/10/2016 1140   CREATININE 1.10 06/10/2016 1140   CALCIUM 9.6 06/10/2016 1140   PROT 6.8 12/26/2013 0843   ALBUMIN 3.8 12/26/2013 0843   AST 13 12/26/2013 0843   ALT 11 12/26/2013 0843   ALKPHOS 51 12/26/2013 0843   BILITOT 0.6 12/26/2013 0843   GFRNONAA 70 06/10/2016 1140   GFRAA 80 06/10/2016 1140   Updated CBC, CMP ordered today  TB Gold: ordered today Hepatitis panel: ordered today  Chest-xray:  12/24/2003 "IMPRESSION: No interval change. No active disease."   Contraception: discussed  Alcohol use: none  PCV-13: 11/28/2015  Assessment/Plan:  Patient was counseled on the purpose, proper use, and adverse effects of methotrexate including nausea, infection, and signs and symptoms of pneumonitis.  Reviewed instructions with patient to take methotrexate weekly along with folic acid daily.  Discussed the importance of frequent monitoring of kidney and liver function and blood counts, and provided patient with standing lab instructions.  Counseled patient to avoid NSAIDs and alcohol while on methotrexate.  Provided patient with educational materials on methotrexate and answered all  questions.  Advised patient to get annual influenza vaccine and to get a pneumococcal vaccine if patient has not already had one.  Patient voiced understanding.  Patient consented to methotrexate use.  Will upload into chart.    Elisabeth Most, Pharm.D., BCPS Clinical Pharmacist Pager: [phone] Phone: [phone] 12/25/2016 10:29 AM

## 2016-12-26 LAB — URINALYSIS, ROUTINE W REFLEX MICROSCOPIC
Bilirubin Urine: NEGATIVE
Glucose, UA: NEGATIVE
Hgb urine dipstick: NEGATIVE
KETONES UR: NEGATIVE
LEUKOCYTES UA: NEGATIVE
NITRITE: NEGATIVE
PH: 5.5 (ref 5.0–8.0)
Protein, ur: NEGATIVE
SPECIFIC GRAVITY, URINE: 1.02 (ref 1.001–1.035)

## 2016-12-26 LAB — HEPATITIS B CORE ANTIBODY, IGM: HEP B C IGM: NONREACTIVE

## 2016-12-26 LAB — HEPATITIS B SURFACE ANTIGEN: HEP B S AG: NEGATIVE

## 2016-12-26 LAB — SEDIMENTATION RATE: SED RATE: 6 mm/h (ref 0–20)

## 2016-12-26 LAB — HEPATITIS C ANTIBODY: HCV Ab: NEGATIVE

## 2016-12-28 LAB — QUANTIFERON TB GOLD ASSAY (BLOOD)
INTERFERON GAMMA RELEASE ASSAY: NEGATIVE
Mitogen-Nil: 7.82 IU/mL
QUANTIFERON NIL VALUE: 0.03 [IU]/mL
QUANTIFERON TB AG MINUS NIL: 0.14 [IU]/mL

## 2016-12-28 LAB — IGG, IGA, IGM
IgA: 211 mg/dL (ref 81–463)
IgG (Immunoglobin G), Serum: 1308 mg/dL (ref 694–1618)
IgM, Serum: 21 mg/dL — ABNORMAL LOW (ref 48–271)

## 2016-12-29 LAB — PROTEIN ELECTROPHORESIS, SERUM, WITH REFLEX
ALBUMIN ELP: 3.5 g/dL — AB (ref 3.8–4.8)
ALPHA-1-GLOBULIN: 0.3 g/dL (ref 0.2–0.3)
ALPHA-2-GLOBULIN: 0.7 g/dL (ref 0.5–0.9)
Beta 2: 0.4 g/dL (ref 0.2–0.5)
Beta Globulin: 0.4 g/dL (ref 0.4–0.6)
Gamma Globulin: 1.1 g/dL (ref 0.8–1.7)
TOTAL PROTEIN, SERUM ELECTROPHOR: 6.4 g/dL (ref 6.1–8.1)

## 2016-12-29 NOTE — Progress Notes (Signed)
WNL

## 2016-12-30 ENCOUNTER — Telehealth: Payer: Self-pay | Admitting: Pharmacist

## 2016-12-30 DIAGNOSIS — M069 Rheumatoid arthritis, unspecified: Secondary | ICD-10-CM

## 2016-12-30 NOTE — Telephone Encounter (Signed)
Patient requested refill of methotrexate at new patient visit on 12/25/16.  We were waiting on labs to result before sending in the medication.  Labs reviewed by Dr. Estanislado Pandy and WNL, will send in methotrexate.  I called patient to see which pharmacy he would like this rx to go to.  Left message asking patient to call me back.   Elisabeth Most, Pharm.D., BCPS, CPP Clinical Pharmacist Pager: [phone] Phone: [phone] 12/30/2016 8:38 AM

## 2016-12-31 MED ORDER — METHOTREXATE 2.5 MG PO TABS: 10.0000 mg | ORAL_TABLET | ORAL | 1 refills | Status: DC

## 2016-12-31 NOTE — Telephone Encounter (Signed)
Patient came by office.  Reviewed methotrexate dosing instructions.  Refill was sent to [facility].    Elisabeth Most, Pharm.D., BCPS, CPP Clinical Pharmacist Pager: [phone] Phone: [phone] 12/31/2016 2:17 PM
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
## 2017-01-25 DIAGNOSIS — M47812 Spondylosis without myelopathy or radiculopathy, cervical region: Secondary | ICD-10-CM | POA: Insufficient documentation

## 2017-01-25 DIAGNOSIS — M5136 Other intervertebral disc degeneration, lumbar region: Secondary | ICD-10-CM | POA: Insufficient documentation

## 2017-01-27 ENCOUNTER — Ambulatory Visit (INDEPENDENT_AMBULATORY_CARE_PROVIDER_SITE_OTHER): Payer: Medicare Other | Admitting: Rheumatology

## 2017-01-27 ENCOUNTER — Encounter: Payer: Self-pay | Admitting: Rheumatology

## 2017-01-27 VITALS — BP 156/69 | HR 85 | Ht 75.0 in | Wt 246.0 lb

## 2017-01-27 DIAGNOSIS — M67911 Unspecified disorder of synovium and tendon, right shoulder: Secondary | ICD-10-CM

## 2017-01-27 DIAGNOSIS — M19071 Primary osteoarthritis, right ankle and foot: Secondary | ICD-10-CM

## 2017-01-27 DIAGNOSIS — Z8639 Personal history of other endocrine, nutritional and metabolic disease: Secondary | ICD-10-CM

## 2017-01-27 DIAGNOSIS — Z8546 Personal history of malignant neoplasm of prostate: Secondary | ICD-10-CM

## 2017-01-27 DIAGNOSIS — M19042 Primary osteoarthritis, left hand: Secondary | ICD-10-CM

## 2017-01-27 DIAGNOSIS — M5030 Other cervical disc degeneration, unspecified cervical region: Secondary | ICD-10-CM | POA: Diagnosis not present

## 2017-01-27 DIAGNOSIS — M5136 Other intervertebral disc degeneration, lumbar region: Secondary | ICD-10-CM

## 2017-01-27 DIAGNOSIS — M7711 Lateral epicondylitis, right elbow: Secondary | ICD-10-CM

## 2017-01-27 DIAGNOSIS — M19072 Primary osteoarthritis, left ankle and foot: Secondary | ICD-10-CM

## 2017-01-27 DIAGNOSIS — M7712 Lateral epicondylitis, left elbow: Secondary | ICD-10-CM

## 2017-01-27 DIAGNOSIS — Z8679 Personal history of other diseases of the circulatory system: Secondary | ICD-10-CM

## 2017-01-27 DIAGNOSIS — M19041 Primary osteoarthritis, right hand: Secondary | ICD-10-CM | POA: Diagnosis not present

## 2017-01-27 DIAGNOSIS — M0579 Rheumatoid arthritis with rheumatoid factor of multiple sites without organ or systems involvement: Secondary | ICD-10-CM | POA: Diagnosis not present

## 2017-01-27 DIAGNOSIS — Z79899 Other long term (current) drug therapy: Secondary | ICD-10-CM | POA: Diagnosis not present

## 2017-01-27 DIAGNOSIS — Z87448 Personal history of other diseases of urinary system: Secondary | ICD-10-CM

## 2017-01-27 DIAGNOSIS — M51369 Other intervertebral disc degeneration, lumbar region without mention of lumbar back pain or lower extremity pain: Secondary | ICD-10-CM

## 2017-01-27 DIAGNOSIS — M069 Rheumatoid arthritis, unspecified: Secondary | ICD-10-CM

## 2017-01-27 DIAGNOSIS — M47812 Spondylosis without myelopathy or radiculopathy, cervical region: Secondary | ICD-10-CM

## 2017-01-27 MED ORDER — DICLOFENAC SODIUM 1 % TD GEL: 2.0000 g | Freq: Four times a day (QID) | TRANSDERMAL | 1 refills | Status: DC | PRN

## 2017-01-27 MED ORDER — METHOTREXATE 2.5 MG PO TABS: 10.0000 mg | ORAL_TABLET | ORAL | 0 refills | Status: DC

## 2017-01-27 NOTE — Progress Notes (Signed)
Rheumatology Medication Review by a Pharmacist Does the patient feel that his/her medications are working for him/her?  Yes Has the patient been experiencing any side effects to the medications prescribed?  No Does the patient have any problems obtaining medications?  No  Issues to address at subsequent visits: None   Pharmacist comments:  Rex is a pleasant 66 yo M who presents for follow up of rheumatoid arthritis.  He is currently taking methotrexate 8 tablets weekly and folic acid 2 mg daily.  Most recent standing labs were on 12/25/16.  Patient will be due for labs again in September 2018 and every 3 months.    Patient was prescribed Voltaren gel during today's visit.  Counseled patient on the purpose, proper use, and adverse effects of Voltaren gel.  Advised patient to avoid using oral ibuprofen and topical diclofenac together.  Provided patient with a GoodRx coupon card if medication is not covered by his insurance.  Patient voiced understanding and denies any questions or concerns regarding his medications at this time.   Elisabeth Most, Pharm.D., BCPS, CPP Clinical Pharmacist Pager: [phone] Phone: [phone] 01/27/2017 2:27 PM

## 2017-01-27 NOTE — Patient Instructions (Addendum)
Standing Labs We placed an order today for your standing lab work.    Please come back and get your standing labs in September and every 3 months Elbow and Forearm Exercises Ask your health care provider which exercises are safe for you. Do exercises exactly as told by your health care provider and adjust them as directed. It is normal to feel mild stretching, pulling, tightness, or discomfort as you do these exercises, but you should stop right away if you feel sudden pain or your pain gets worse.Do not begin these exercises until told by your health care provider. RANGE OF MOTION EXERCISES These exercises warm up your muscles and joints and improve the movement and flexibility of your injured elbow and forearm. These exercises also help to relieve pain, numbness, and tingling.These exercises are done using the muscles in your injured elbow and forearm. Exercise A: Elbow Flexion, Active 1. Hold your left / right arm at your side, and bend your elbow as far as you can using your left / right arm muscles. 2. Hold this position for __________ seconds. 3. Slowly return to the starting position. Repeat __________ times. Complete this exercise __________ times a day. Exercise B: Elbow Extension, Active 1. Hold your left / right arm at your side, and straighten your elbow as much as you can using your left / right arm muscles. 2. Hold this position for __________ seconds. 3. Slowly return to the starting position. Repeat __________ times. Complete this exercise __________ times a day. Exercise C: Forearm Rotation, Supination, Active 1. Stand or sit with your elbows at your sides. 2. Bend your left / right elbow to an "L" shape (90 degrees). 3. Turn your palm upward until you feel a gentle stretch on the inside of your forearm. 4. Hold this position for __________ seconds. 5. Slowly release and return to the starting position. Repeat __________ times. Complete this exercise __________ times a  day. Exercise D: Forearm Rotation, Pronation, Active 1. Stand or sit with your elbows at your side. 2. Bend your left / right elbow to an "L" shape (90 degrees). 3. Turn your left / right palm downward until you feel a gentle stretch on the top of your forearm. 4. Hold this position for __________ seconds. 5. Slowly release and return to the starting position. Repeat__________ times. Complete this exercise __________ times a day. STRETCHING EXERCISES These exercises warm up your muscles and joints and improve the movement and flexibility of your injured elbow and forearm. These exercises also help to relieve pain, numbness, and tingling.These exercises are done using your healthy elbow and forearm to help stretch the muscles in your injured elbow and forearm. Exercise E: Elbow Flexion, Active-Assisted  1. Hold your left / right arm at your side, and bend your elbow as much as you can using your left / right arm muscles. 2. Use your other hand to bend your left / right elbow farther. To do this, gently push up on your forearm until you feel a gentle stretch on the back of your elbow. 3. Hold this position for __________ seconds. 4. Slowly return to the starting position. Repeat __________ times. Complete this exercise __________ times a day. Exercise F: Elbow Extension, Active-Assisted  1. Hold your left / right arm at your side, and straighten your elbow as much as you can using your left / right arm muscles. 2. Use your other hand to straighten the left / right elbow farther. To do this, gently push down on your  forearm until you feel a gentle stretch on the inside of your elbow. 3. Hold this position for __________ seconds. 4. Slowly return to the starting position. Repeat __________ times. Complete this exercise __________ times a day. Exercise G: Forearm Rotation, Supination, Active-Assisted  1. Sit with your left / right elbow bent in an "L" shape (90 degrees) with your forearm  resting on a table. 2. Keeping your upper body and shoulder still, rotate your forearm so your left / right palm faces upward. 3. Use your other hand to help rotate your forearm further until you feel a gentle to moderate stretch. 4. Hold this position for __________ seconds. 5. Slowly release the stretch and return to the starting position. Repeat __________ times. Complete this exercise __________ times a day. Exercise H: Forearm Rotation, Pronation, Active-Assisted  1. Sit with your left / right elbow bent in an "L" shape (90 degrees) with your forearm resting on a table. 2. Keeping your upper body and shoulder still, rotate your forearm so your palm faces the tabletop. 3. Use your other hand to help rotate your forearm further until you feel a gentle to moderate stretch. 4. Hold this position for __________ seconds. 5. Slowly release the stretch and return to the starting position. Repeat __________ times. Complete this exercise __________ times a day. Exercise I: Elbow Flexion, Supine, Passive 1. Lie on your back. 2. Extend your left / right arm up in the air, bracing it with your other hand. 3. Let your left / right your hand slowly lower toward your shoulder, while your elbow stays pointed toward the ceiling. You should feel a gentle stretch along the back of your upper arm and elbow. 4. If instructed by your health care provider, you may increase the intensity of your stretch by adding a small wrist weight or hand weight. 5. Hold this position for __________ seconds. 6. Slowly return to the starting position. Repeat __________ times. Complete this exercise __________ times a day. Exercise J: Elbow Extension, Supine, Passive  1. Lie on your back. Make sure that you are in a comfortable position that lets you relax your arm muscles. 2. Place a folded towel under your left / right upper arm so your elbow and shoulder are at the same height. Straighten your left / right arm so your elbow  does not rest on the bed or towel. 3. Let the weight of your hand stretch your elbow. Keep your arm and chest muscles relaxed. You should feel a stretch on the inside of your elbow. 4. If told by your health care provider, you may increase the intensity of your stretch by adding a small wrist weight or hand weight. 5. Hold this position for__________ seconds. 6. Slowly release the stretch. Repeat __________ times. Complete this exercise __________ times a day. STRENGTHENING EXERCISES These exercises build strength and endurance in your elbow and forearm. Endurance is the ability to use your muscles for a long time, even after they get tired. Exercise K: Elbow Flexion, Isometric  1. Stand or sit up straight. 2. Bend your left / right elbow in an "L" shape (90 degrees) and turn your palm up so your forearm is at the height of your waist. 3. Place your other hand on top of your forearm. Gently push down as your left / right arm resists. Push as hard as you can with both arms without causing any pain or movement at your left / right elbow. 4. Hold this position for __________ seconds. 5.  Slowly release the tension in both arms. Let your muscles relax completely before repeating. Repeat __________ times. Complete this exercise __________ times a day. Exercise L: Elbow Extensors, Isometric  1. Stand or sit up straight. 2. Place your left / right arm so your palm faces your abdomen and it is at the height of your waist. 3. Place your other hand on the underside of your forearm. Gently push up as your left / right arm resists. Push as hard as you can with both arms, without causing any pain or movement at your left / right elbow. 4. Hold this position for __________ seconds. 5. Slowly release the tension in both arms. Let your muscles relax completely before repeating. Repeat __________ times. Complete this exercise __________ times a day. Exercise M: Elbow Flexion With Forearm Palm Up  1. Sit  upright on a firm chair without armrests, or stand. 2. Place your left / right arm at your side with your palm facing forward. 3. Holding a __________weight or gripping a rubber exercise band or tubing, bend your elbow to bring your hand toward your shoulder. 4. Hold this position for __________ seconds. 5. Slowly return to the starting position. Repeat __________times. Complete this exercise __________times a day. Exercise N: Elbow Extension  1. Sit on a firm chair without armrests, or stand. 2. Keeping your upper arms at your sides, bring both hands up toward your left / right shoulder while you grip a rubber exercise band or tubing. Your left / right hand should be just below the other hand. 3. Straighten your left / right elbow. 4. Hold this position for __________ seconds. 5. Control the resistance of the band or tubing as your hand returns to your side. Repeat __________times. Complete this exercise __________times a day. Exercise O: Forearm Rotation, Supination  1. Sit with your left / right forearm supported on a table. Keep your elbow at waist height. 2. Rest your hand over the edge of the table with your palm facing down. 3. Gently hold a lightweight hammer. 4. Without moving your elbow, slowly rotate your forearm to turn your palm and hand upward to a "thumbs-up" position. 5. Hold this position for __________ seconds. 6. Slowly return to the starting position. Repeat __________times. Complete this exercise __________times a day. Exercise P: Forearm Rotation, Pronation  1. Sit with your left / right forearm supported on a table. Keep your elbow below shoulder height. 2. Rest your hand over the edge of the table with your palm facing up. 3. Gently hold a lightweight hammer. 4. Without moving your elbow, slowly rotate your forearm to turn your palm and hand upward to a "thumbs-up" position. 5. Hold this position for __________seconds. 6. Slowly return to the starting  position. Repeat __________times. Complete this exercise __________times a day. This information is not intended to replace advice given to you by your health care provider. Make sure you discuss any questions you have with your health care provider. Document Released: 05/06/2005 Document Revised: 10/31/2015 Document Reviewed: 03/17/2015 Elsevier Interactive Patient Education  [employer].   We have open lab Monday through Friday from 8:30-11:30 AM and 1:30-4 PM at the office of Dr. Bo Merino.   The office is located at [address] [place], [place], [place] [address] No appointment is necessary.   Labs are drawn by [facility].  You may receive a bill from [facility] for your lab work. If you have any questions regarding directions or hours of operation,  please call [phone].

## 2017-02-05 ENCOUNTER — Other Ambulatory Visit: Payer: Self-pay | Admitting: *Deleted

## 2017-02-05 MED ORDER — FLUTICASONE PROPIONATE 50 MCG/ACT NA SUSP: 2.0000 | Freq: Every day | NASAL | 6 refills | Status: DC

## 2017-03-12 DIAGNOSIS — C61 Malignant neoplasm of prostate: Secondary | ICD-10-CM | POA: Diagnosis not present

## 2017-03-24 ENCOUNTER — Other Ambulatory Visit: Payer: Self-pay

## 2017-03-24 DIAGNOSIS — Z79899 Other long term (current) drug therapy: Secondary | ICD-10-CM

## 2017-03-24 LAB — COMPLETE METABOLIC PANEL WITH GFR
AG RATIO: 1.4 (calc) (ref 1.0–2.5)
ALKALINE PHOSPHATASE (APISO): 62 U/L (ref 40–115)
ALT: 13 U/L (ref 9–46)
AST: 17 U/L (ref 10–35)
Albumin: 3.6 g/dL (ref 3.6–5.1)
BILIRUBIN TOTAL: 0.5 mg/dL (ref 0.2–1.2)
BUN: 9 mg/dL (ref 7–25)
CHLORIDE: 105 mmol/L (ref 98–110)
CO2: 29 mmol/L (ref 20–32)
CREATININE: 1.15 mg/dL (ref 0.70–1.25)
Calcium: 9 mg/dL (ref 8.6–10.3)
GFR, Est African American: 76 mL/min/{1.73_m2} (ref >=60)
GFR, Est Non African American: 65 mL/min/{1.73_m2} (ref >=60)
GLOBULIN: 2.6 g/dL (ref 1.9–3.7)
Glucose, Bld: 186 mg/dL — ABNORMAL HIGH (ref 65–99)
POTASSIUM: 4.3 mmol/L (ref 3.5–5.3)
Sodium: 140 mmol/L (ref 135–146)
Total Protein: 6.2 g/dL (ref 6.1–8.1)

## 2017-03-24 LAB — CBC WITH DIFFERENTIAL/PLATELET
BASOS ABS: 23 {cells}/uL (ref 0–200)
Basophils Relative: 0.3 %
EOS ABS: 84 {cells}/uL (ref 15–500)
EOS PCT: 1.1 %
HEMATOCRIT: 38.2 % — AB (ref 38.5–50.0)
Hemoglobin: 12.3 g/dL — ABNORMAL LOW (ref 13.2–17.1)
LYMPHS ABS: 1398 {cells}/uL (ref 850–3900)
MCH: 28 pg (ref 27.0–33.0)
MCHC: 32.2 g/dL (ref 32.0–36.0)
MCV: 87 fL (ref 80.0–100.0)
MONOS PCT: 6 %
MPV: 11.6 fL (ref 7.5–12.5)
NEUTROS PCT: 74.2 %
Neutro Abs: 5639 cells/uL (ref 1500–7800)
Platelets: 235 10*3/uL (ref 140–400)
RBC: 4.39 10*6/uL (ref 4.20–5.80)
RDW: 13.7 % (ref 11.0–15.0)
Total Lymphocyte: 18.4 %
WBC mixed population: 456 cells/uL (ref 200–950)
WBC: 7.6 10*3/uL (ref 3.8–10.8)

## 2017-03-25 ENCOUNTER — Other Ambulatory Visit: Payer: Self-pay | Admitting: Student

## 2017-03-25 DIAGNOSIS — I10 Essential (primary) hypertension: Secondary | ICD-10-CM

## 2017-03-25 MED ORDER — LISINOPRIL 20 MG PO TABS: 20.0000 mg | ORAL_TABLET | Freq: Every day | ORAL | 3 refills | Status: DC

## 2017-03-25 NOTE — Progress Notes (Signed)
Labs are stable. Glucose is elevated. Please notify patient and fax results to his PCP.

## 2017-04-02 ENCOUNTER — Other Ambulatory Visit: Payer: Self-pay | Admitting: Student

## 2017-04-07 NOTE — Telephone Encounter (Signed)
2nd request.  Martin, Tamika L, RN  

## 2017-04-07 NOTE — Telephone Encounter (Signed)
I have attempted to call him multiple times but he doesn't pick up his phone. Not sure why he needs test strip. He is just on Metformin. His A1c has been good. Please advise him when he calls back. Thanks! Bretta Bang

## 2017-04-12 ENCOUNTER — Other Ambulatory Visit: Payer: Self-pay | Admitting: Student

## 2017-04-12 ENCOUNTER — Other Ambulatory Visit: Payer: Self-pay | Admitting: Rheumatology

## 2017-04-12 DIAGNOSIS — M069 Rheumatoid arthritis, unspecified: Secondary | ICD-10-CM

## 2017-04-12 DIAGNOSIS — I10 Essential (primary) hypertension: Secondary | ICD-10-CM

## 2017-04-12 MED ORDER — LISINOPRIL 20 MG PO TABS: 20.0000 mg | ORAL_TABLET | Freq: Every day | ORAL | 3 refills | Status: DC

## 2017-04-12 NOTE — Telephone Encounter (Signed)
Last visit: 01/27/17 Next Visit: 06/23/17 Labs: 03/24/17 Labs are stable. Glucose is elevated  Okay to refill per Dr. Estanislado Pandy

## 2017-04-13 NOTE — Telephone Encounter (Signed)
Placed call, no answer. No VM. If pt calls, please give him the information below.  Ottis Stain, CMA

## 2017-04-21 ENCOUNTER — Other Ambulatory Visit: Payer: Self-pay | Admitting: Student

## 2017-04-21 DIAGNOSIS — Z8546 Personal history of malignant neoplasm of prostate: Secondary | ICD-10-CM

## 2017-04-21 DIAGNOSIS — E118 Type 2 diabetes mellitus with unspecified complications: Secondary | ICD-10-CM

## 2017-04-30 NOTE — Telephone Encounter (Signed)
Patient has an appointment on 11-2 with PCP and discuss this refill then. Jazmin Hartsell,CMA

## 2017-04-30 NOTE — Telephone Encounter (Signed)
Called, no answer. Ottis Stain, CMA

## 2017-05-07 ENCOUNTER — Encounter: Payer: Self-pay | Admitting: Student

## 2017-05-07 ENCOUNTER — Ambulatory Visit (INDEPENDENT_AMBULATORY_CARE_PROVIDER_SITE_OTHER): Payer: Medicare Other | Admitting: Student

## 2017-05-07 VITALS — BP 130/78 | HR 79 | Temp 97.9°F | Ht 75.0 in | Wt 246.2 lb

## 2017-05-07 DIAGNOSIS — Z23 Encounter for immunization: Secondary | ICD-10-CM

## 2017-05-07 DIAGNOSIS — Z794 Long term (current) use of insulin: Secondary | ICD-10-CM

## 2017-05-07 DIAGNOSIS — E118 Type 2 diabetes mellitus with unspecified complications: Secondary | ICD-10-CM

## 2017-05-07 LAB — POCT GLYCOSYLATED HEMOGLOBIN (HGB A1C): HEMOGLOBIN A1C: 6.8

## 2017-05-07 NOTE — Addendum Note (Signed)
Addended by: Londell Moh T on: 05/07/2017 09:26 AM   Modules accepted: Orders, SmartSet

## 2017-05-07 NOTE — Patient Instructions (Addendum)
It was great seeing you today! We have addressed the following issues today Diabetes: your A1c is 6.8 % today. It was 7.2 %. Your goal A1c is less than 7.0 %. See below for more information about A1c.  Continue taking your metformin.  I strongly recommend you see your dentist.  Keep your toenails short.  follow-up in 6 months.    Body hearing, you may return to clinic for evaluation.   What is A1c:  The A1C test result reflects your average blood sugar level for the past two to three months. Specifically, the A1C test measures what percentage of your hemoglobin - a protein in red blood cells that carries oxygen - is coated with sugar (glycated). The higher your A1C level, the poorer your blood sugar control and the higher your risk of diabetes complications. Portion Size    Choose healthier foods such as 100% whole grains, vegetables, fruits, beans, nut seeds, olive oil, most vegetable oils, fat-free dietary, wild game and fish.   Avoid sweet tea, other sweetened beverages, soda, fruit juice, cold cereal and milk and trans fat.   Eat at least 3 meals and 1-2 snacks per day.  Aim for no more than 5 hours between eating.  Eat breakfast within one hour of getting up.    Exercise at least 150 minutes per week, including weight resistance exercises 3 or 4 times per week.   Try to lose at least 7-10% of your current body weight.   Hypoglycemia Hypoglycemia is when the sugar (glucose) level in the blood is too low. Symptoms of low blood sugar may include:  Feeling: ? Hungry. ? Worried or nervous (anxious). ? Sweaty and clammy. ? Confused. ? Dizzy. ? Sleepy. ? Sick to your stomach (nauseous).  Having: ? A fast heartbeat. ? A headache. ? A change in your vision. ? Jerky movements that you cannot control (seizure). ? Nightmares. ? Tingling or no feeling (numbness) around the mouth, lips, or tongue.  Having trouble with: ? Talking. ? Paying attention (concentrating). ? Moving  (coordination). ? Sleeping.  Shaking.  Passing out (fainting).  Getting upset easily (irritability).  Low blood sugar can happen to people who have diabetes and people who do not have diabetes. Low blood sugar can happen quickly, and it can be an emergency. Treating Low Blood Sugar Low blood sugar is often treated by eating or drinking something sugary right away. If you can think clearly and swallow safely, follow the 15:15 rule:  Take 15 grams of a fast-acting carb (carbohydrate). Some fast-acting carbs are: ? 1 tube of glucose gel. ? 3 sugar tablets (glucose pills). ? 6-8 pieces of hard candy. ? 4 oz (120 mL) of fruit juice. ? 4 oz (120 mL) of regular (not diet) soda.  Check your blood sugar 15 minutes after you take the carb.  If your blood sugar is still at or below 70 mg/dL (3.9 mmol/L), take 15 grams of a carb again.  If your blood sugar does not go above 70 mg/dL (3.9 mmol/L) after 3 tries, get help right away.  After your blood sugar goes back to normal, eat a meal or a snack within 1 hour.  Treating Very Low Blood Sugar If your blood sugar is at or below 54 mg/dL (3 mmol/L), you have very low blood sugar (severe hypoglycemia). This is an emergency. Do not wait to see if the symptoms will go away. Get medical help right away. Call your local emergency services (911 in the [place]  Do not drive yourself to the hospital. If you have very low blood sugar and you cannot eat or drink, you may need a glucagon shot (injection). A family member or friend should learn how to check your blood sugar and how to give you a glucagon shot. Ask your doctor if you need to have a glucagon shot kit at home. Follow these instructions at home: General instructions  Avoid any diets that cause you to not eat enough food. Talk with your doctor before you start any new diet.  Take over-the-counter and prescription medicines only as told by your doctor.  Limit alcohol to no more than 1 drink per  day for nonpregnant women and 2 drinks per day for men. One drink equals 12 oz of beer, 5 oz of wine, or 1 oz of hard liquor.  Keep all follow-up visits as told by your doctor. This is important. If You Have Diabetes:   Make sure you know the symptoms of low blood sugar.  Always keep a source of sugar with you, such as: ? Sugar. ? Sugar tablets. ? Glucose gel. ? Fruit juice. ? Regular soda (not diet soda). ? Milk. ? Hard candy. ? Honey.  Take your medicines as told.  Follow your exercise and meal plan. ? Eat on time. Do not skip meals. ? Follow your sick day plan when you cannot eat or drink normally. Make this plan ahead of time with your doctor.  Check your blood sugar as often as told by your doctor. Always check before and after exercise.  Share your diabetes care plan with: ? Your work or school. ? People you live with.  Check your pee (urine) for ketones: ? When you are sick. ? As told by your doctor.  Carry a card or wear jewelry that says you have diabetes. If You Have Low Blood Sugar From Other Causes:   Check your blood sugar as often as told by your doctor.  Follow instructions from your doctor about what you cannot eat or drink. Contact a doctor if:  You have trouble keeping your blood sugar in your target range.  You have low blood sugar often. Get help right away if:  You still have symptoms after you eat or drink something sugary.  Your blood sugar is at or below 54 mg/dL (3 mmol/L).  You have jerky movements that you cannot control.  You pass out. These symptoms may be an emergency. Do not wait to see if the symptoms will go away. Get medical help right away. Call your local emergency services (911 in the [place] Do not drive yourself to the hospital. This information is not intended to replace advice given to you by your health care provider. Make sure you discuss any questions you have with your health care provider. Document Released:  09/16/2009 Document Revised: 11/28/2015 Document Reviewed: 07/26/2015 Elsevier Interactive Patient Education  [employer].

## 2017-05-07 NOTE — Progress Notes (Signed)
Subjective:    Eric Holloway is a 66 y.o. old male here for follow-up on diabetes  HPI Diabetes: Controlled.  A1c 6.8%.  Taking metformin 1000 mg twice a day. Good compliance with his medication. Tolerating his medication well. A lot of walking at work.  He is a Secretary/administrator at [employer]. Trying to watch what he eats. Avoids a lot of carbs.  Had an eye exam about 8 months ago. Was seen at [facility] in 10/2016. Hasn't seen a dentist in a while. Reports mild numbness in his feet.  Denies other signs of hyper or hypoglycemia.   PMH/Problem List: has ONYCHOMYCOSIS; T2DM (type 2 diabetes mellitus) ([place]); Mixed hyperlipidemia; OBESITY; HYPERTENSION, BENIGN SYSTEMIC; Rheumatoid arthritis ([place]); LOW BACK PAIN, CHRONIC; PARESTHESIA; PROTEINURIA; PROSTATE CANCER, HX OF; Screening for colorectal cancer; Right ankle pain; Preventive measure; Allergic rhinitis; Erectile dysfunction following radiation therapy; Hearing loss of both ears; Dizziness; Tendinopathy of right shoulder; DJD (degenerative joint disease), cervical; and DDD (degenerative disc disease), lumbar on his problem list.   has a past medical history of Arthritis; Cataract; Contusion of flank and back (09/17/2011); Diabetes mellitus; Hyperlipidemia; Hypertension; Prostate cancer ([place]) (2005); and Ulcer (1972).  FH:  Family History  Problem Relation Age of Onset  . Stomach cancer Maternal Uncle   . Diabetes Mother   . Hypertension Mother   . Diabetes Father   . Heart attack Sister   . Lupus Brother   . Parkinson's disease Brother   . Colon cancer Neg Hx   . Colon polyps Neg Hx   . Rectal cancer Neg Hx     SH Social History  Substance Use Topics  . Smoking status: Former Smoker    Packs/day: 0.50    Years: 5.00    Quit date: 09/30/1971  . Smokeless tobacco: Never Used  . Alcohol use No    Review of Systems Review of systems negative except for pertinent positives and negatives in history of present illness above.     Objective:      Vitals:   05/07/17 0846  BP: 130/78  Pulse: 79  Temp: 97.9 F (36.6 C)  TempSrc: Oral  SpO2: 98%  Weight: 246 lb 3.2 oz (111.7 kg)  Height: 6\' 3"  (1.905 m)   Body mass index is 30.77 kg/m.  Physical Exam GEN: appears well, no apparent distress. HEM: negative for cervical or periauricular lymphadenopathies ENDO: negative thyromegally CVS: RRR, nl S1&S2, no murmurs, no edema.  Faint DP pulses but PT pulses 2+ bilaterally.  RESP: no IWOB, good air movement bilaterally, CTAB MSK: no focal tenderness or notable swelling SKIN: no apparent skin lesion. NEURO: alert and oiented appropriately, no gross deficits  PSYCH: euthymic mood with congruent affect Diabetic Foot Exam: Inspection: no skin lesion, ulcer or callus. Toenails fairly trimmed  Vascular: Faint DP pulses but PT pulses 2+ bilaterally Neuro: Intact 9-point monofilament exam bilaterally    Assessment and Plan:  1. Type 2 diabetes mellitus with complication, with long-term current use of insulin ([place]): Controlled.  A1c 6.8%. Congratulated on this. He is thrilled about this. We will continue metformin 1 g twice daily.  Discussed about lifestyle change including exercise and diet as well.  Gave handout.  Received his PPSV-23.  Recommended dental follow-up.  Up-to-date on eye exam.  Follow-up in 6 months.  2.  Concern about hearing: This is a chronic issue since he was a teenager.  No recent changes.  Recommend return to clinic for evaluation before initiating audiology referral.  He stated  that he was seen by audiologist in the past.   Return in about 6 months (around 11/04/2017) for DM.  Mercy Riding, MD 05/07/17 Pager: [phone]
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
## 2017-06-23 ENCOUNTER — Encounter: Payer: Self-pay | Admitting: Rheumatology

## 2017-06-23 ENCOUNTER — Ambulatory Visit: Payer: Medicare Other | Admitting: Rheumatology

## 2017-06-23 ENCOUNTER — Other Ambulatory Visit: Payer: Self-pay | Admitting: Student

## 2017-06-23 VITALS — BP 143/78 | HR 72 | Resp 16 | Ht 75.0 in | Wt 254.0 lb

## 2017-06-23 DIAGNOSIS — Z8546 Personal history of malignant neoplasm of prostate: Secondary | ICD-10-CM

## 2017-06-23 DIAGNOSIS — Z8679 Personal history of other diseases of the circulatory system: Secondary | ICD-10-CM | POA: Diagnosis not present

## 2017-06-23 DIAGNOSIS — M19041 Primary osteoarthritis, right hand: Secondary | ICD-10-CM

## 2017-06-23 DIAGNOSIS — I10 Essential (primary) hypertension: Secondary | ICD-10-CM

## 2017-06-23 DIAGNOSIS — M5136 Other intervertebral disc degeneration, lumbar region: Secondary | ICD-10-CM

## 2017-06-23 DIAGNOSIS — Z79899 Other long term (current) drug therapy: Secondary | ICD-10-CM

## 2017-06-23 DIAGNOSIS — Z87448 Personal history of other diseases of urinary system: Secondary | ICD-10-CM | POA: Diagnosis not present

## 2017-06-23 DIAGNOSIS — M19042 Primary osteoarthritis, left hand: Secondary | ICD-10-CM

## 2017-06-23 DIAGNOSIS — M069 Rheumatoid arthritis, unspecified: Secondary | ICD-10-CM

## 2017-06-23 DIAGNOSIS — M19071 Primary osteoarthritis, right ankle and foot: Secondary | ICD-10-CM | POA: Diagnosis not present

## 2017-06-23 DIAGNOSIS — M5030 Other cervical disc degeneration, unspecified cervical region: Secondary | ICD-10-CM

## 2017-06-23 DIAGNOSIS — Z8639 Personal history of other endocrine, nutritional and metabolic disease: Secondary | ICD-10-CM

## 2017-06-23 DIAGNOSIS — M0579 Rheumatoid arthritis with rheumatoid factor of multiple sites without organ or systems involvement: Secondary | ICD-10-CM

## 2017-06-23 DIAGNOSIS — M19072 Primary osteoarthritis, left ankle and foot: Secondary | ICD-10-CM

## 2017-06-23 LAB — CBC WITH DIFFERENTIAL/PLATELET
BASOS ABS: 9 {cells}/uL (ref 0–200)
Basophils Relative: 0.1 %
EOS ABS: 85 {cells}/uL (ref 15–500)
Eosinophils Relative: 0.9 %
HEMATOCRIT: 36 % — AB (ref 38.5–50.0)
HEMOGLOBIN: 11.7 g/dL — AB (ref 13.2–17.1)
LYMPHS ABS: 1147 {cells}/uL (ref 850–3900)
MCH: 28.5 pg (ref 27.0–33.0)
MCHC: 32.5 g/dL (ref 32.0–36.0)
MCV: 87.6 fL (ref 80.0–100.0)
MPV: 11 fL (ref 7.5–12.5)
Monocytes Relative: 5.3 %
NEUTROS ABS: 7661 {cells}/uL (ref 1500–7800)
Neutrophils Relative %: 81.5 %
Platelets: 231 10*3/uL (ref 140–400)
RBC: 4.11 10*6/uL — ABNORMAL LOW (ref 4.20–5.80)
RDW: 13.8 % (ref 11.0–15.0)
Total Lymphocyte: 12.2 %
WBC mixed population: 498 cells/uL (ref 200–950)
WBC: 9.4 10*3/uL (ref 3.8–10.8)

## 2017-06-23 LAB — COMPLETE METABOLIC PANEL WITH GFR
AG Ratio: 1.4 (calc) (ref 1.0–2.5)
ALBUMIN MSPROF: 3.5 g/dL — AB (ref 3.6–5.1)
ALKALINE PHOSPHATASE (APISO): 50 U/L (ref 40–115)
ALT: 12 U/L (ref 9–46)
AST: 13 U/L (ref 10–35)
BILIRUBIN TOTAL: 0.5 mg/dL (ref 0.2–1.2)
BUN: 11 mg/dL (ref 7–25)
CHLORIDE: 103 mmol/L (ref 98–110)
CO2: 30 mmol/L (ref 20–32)
CREATININE: 0.99 mg/dL (ref 0.70–1.25)
Calcium: 9.5 mg/dL (ref 8.6–10.3)
GFR, Est African American: 91 mL/min/{1.73_m2} (ref >=60)
GFR, Est Non African American: 78 mL/min/{1.73_m2} (ref >=60)
GLOBULIN: 2.5 g/dL (ref 1.9–3.7)
Glucose, Bld: 161 mg/dL — ABNORMAL HIGH (ref 65–99)
Potassium: 4.1 mmol/L (ref 3.5–5.3)
SODIUM: 140 mmol/L (ref 135–146)
Total Protein: 6 g/dL — ABNORMAL LOW (ref 6.1–8.1)

## 2017-06-23 MED ORDER — METHOTREXATE 2.5 MG PO TABS: ORAL_TABLET | ORAL | 0 refills | Status: DC

## 2017-06-23 NOTE — Patient Instructions (Signed)
Standing Labs We placed an order today for your standing lab work.    Please come back and get your standing labs in March and every 3 months  We have open lab Monday through Friday from 8:30-11:30 AM and 1:30-4 PM at the office of Dr. Coralie Stanke.   The office is located at [address], [address], [place], [place] [address] No appointment is necessary.   Labs are drawn by [facility].  You may receive a bill from [facility] for your lab work. If you have any questions regarding directions or hours of operation,  please call [phone].    

## 2017-06-24 NOTE — Progress Notes (Signed)
Mild anemia. Labs are stable.

## 2017-07-01 ENCOUNTER — Other Ambulatory Visit: Payer: Self-pay | Admitting: *Deleted

## 2017-07-01 DIAGNOSIS — I10 Essential (primary) hypertension: Secondary | ICD-10-CM

## 2017-07-01 DIAGNOSIS — E118 Type 2 diabetes mellitus with unspecified complications: Secondary | ICD-10-CM

## 2017-07-01 MED ORDER — ASPIRIN 81 MG PO TBEC
DELAYED_RELEASE_TABLET | ORAL | 3 refills | Status: AC
Start: 2017-07-01 — End: ?

## 2017-08-11 ENCOUNTER — Encounter: Payer: Self-pay | Admitting: Student

## 2017-08-11 DIAGNOSIS — N281 Cyst of kidney, acquired: Secondary | ICD-10-CM | POA: Insufficient documentation

## 2017-08-12 ENCOUNTER — Encounter: Payer: Self-pay | Admitting: Student

## 2017-08-12 ENCOUNTER — Other Ambulatory Visit: Payer: Self-pay

## 2017-08-12 ENCOUNTER — Ambulatory Visit (INDEPENDENT_AMBULATORY_CARE_PROVIDER_SITE_OTHER): Payer: Medicare Other | Admitting: Student

## 2017-08-12 VITALS — BP 140/76 | HR 68 | Temp 98.2°F | Ht 75.0 in | Wt 250.2 lb

## 2017-08-12 DIAGNOSIS — R0609 Other forms of dyspnea: Secondary | ICD-10-CM

## 2017-08-12 DIAGNOSIS — E118 Type 2 diabetes mellitus with unspecified complications: Secondary | ICD-10-CM

## 2017-08-12 DIAGNOSIS — Z794 Long term (current) use of insulin: Secondary | ICD-10-CM | POA: Diagnosis not present

## 2017-08-12 DIAGNOSIS — R42 Dizziness and giddiness: Secondary | ICD-10-CM | POA: Diagnosis not present

## 2017-08-12 DIAGNOSIS — M5136 Other intervertebral disc degeneration, lumbar region: Secondary | ICD-10-CM | POA: Diagnosis not present

## 2017-08-12 DIAGNOSIS — Z862 Personal history of diseases of the blood and blood-forming organs and certain disorders involving the immune mechanism: Secondary | ICD-10-CM

## 2017-08-12 LAB — POCT GLYCOSYLATED HEMOGLOBIN (HGB A1C): Hemoglobin A1C: 7.1

## 2017-08-12 MED ORDER — ACETAMINOPHEN ER 650 MG PO TBCR: 650.0000 mg | EXTENDED_RELEASE_TABLET | Freq: Three times a day (TID) | ORAL | 0 refills | Status: DC | PRN

## 2017-08-12 MED ORDER — DICLOFENAC SODIUM 1 % TD GEL
2.0000 g | Freq: Four times a day (QID) | TRANSDERMAL | 1 refills | Status: DC | PRN
Start: 2017-08-12 — End: 2017-08-20

## 2017-08-12 NOTE — Patient Instructions (Addendum)
It was great seeing you today! We have addressed the following issues today  Shortness of breath: I suspect this is due to low blood level (anemia).  We are checking your hemoglobin and your iron level today.  Back pain: This is likely due to arthritis.  I recommend taking Tylenol 650 mg every 6 hours.  I recommend minimizing use of ibuprofen given you history of blood pressure.   Diabetes: A1c 7.1%.  Previous A1c was 6.7%.  Continue taking your metformin.  I recommend lifestyle change including exercise and diet as below.  If we did any lab work today, and the results require attention, either me or my nurse will get in touch with you. If everything is normal, you will get a letter in mail and a message via . If you don't hear from [place] in two weeks, please give [place] a call. Otherwise, we look forward to seeing you again at your next visit. If you have any questions or concerns before then, please call the clinic at [phone].  Please bring all your medications to every doctors visit  Sign up for My Chart to have easy access to your labs results, and communication with your Primary care physician.    Please check-out at the front desk before leaving the clinic.    Take Care,   Dr. Cyndia Skeeters  Portion Size   Choose healthier foods such as 100% whole grains, vegetables, fruits, beans, nut seeds, olive oil, most vegetable oils, fat-free dietary, wild game and fish.   Avoid sweet tea, other sweetened beverages, soda, fruit juice, cold cereal and milk and trans fat.   Eat at least 3 meals and 1-2 snacks per day.  Aim for no more than 5 hours between eating.  Eat breakfast within one hour of getting up.    Exercise at least 150 minutes per week, including weight resistance exercises 3 or 4 times per week.   Try to lose at least 7-10% of your current body weight.   Limit your salt (Sodium) intake to less than 2 gm (2000 mg) a day if you have conditions such as  elevated blood pressure,  heart failure...    You may also read about DASH and/or Mediterranean diet at the following web site if you have blood pressure or heart condition. IdentityList.se LACodes.co.za

## 2017-08-12 NOTE — Progress Notes (Signed)
Subjective:    Eric Holloway is a 67 y.o. old male here for shortness of breath and back pain  HPI Shortness of breath: for about a month. Feels short of breath after a flight of stair. Denies orthopnea, edema and PND. Reports right sided chest pain. Pain is not exertional. Hasn't noticed aggravating or alleviating factor. Chest pain has been there for about a month as well. Denies recent illness or cold like symptoms. No history of cardiac disease. History of RA on methotrexate. Denies history of long car ride or long flight. Denies pain or swelling of his calf. Reports muscle cramp in his thighs. Admits lightheadedness for two years. Denies palpiation. Denies hematochezia or melena. Had colonoscopy in 2017 that was normal except for internal hemorrhoids.   Back pain: this has been going on for . Pain worse with sitting for long or sitting on something low. Pain is on the right low back. He describes the pain as strain. Pain is intermittent. No radiation to his legs. Denies numbness, tingling or weakness in his legs. No history of cancer, fever, urinary retention, bowel or bladder issue, saddle anesthesia, unintentional weight loss or night sweat.  PMH/Problem List: has ONYCHOMYCOSIS; T2DM (type 2 diabetes mellitus) ([place]); Mixed hyperlipidemia; OBESITY; HYPERTENSION, BENIGN SYSTEMIC; Rheumatoid arthritis ([place]); LOW BACK PAIN, CHRONIC; PARESTHESIA; PROTEINURIA; PROSTATE CANCER, HX OF; Screening for colorectal cancer; Right ankle pain; Preventive measure; Allergic rhinitis; Erectile dysfunction following radiation therapy; Hearing loss of both ears; Dizziness; Tendinopathy of right shoulder; DJD (degenerative joint disease), cervical; DDD (degenerative disc disease), lumbar; and Cyst of right kidney on their problem list.   has a past medical history of Arthritis, Cataract, Contusion of flank and back (09/17/2011), Diabetes mellitus, Hyperlipidemia, Hypertension, Prostate cancer ([place]) (2005), and Ulcer  (1972).  FH:  Family History  Problem Relation Age of Onset  . Stomach cancer Maternal Uncle   . Diabetes Mother   . Hypertension Mother   . Diabetes Father   . Heart attack Sister   . Lupus Brother   . Parkinson's disease Brother   . Colon cancer Neg Hx   . Colon polyps Neg Hx   . Rectal cancer Neg Hx     SH Social History   Tobacco Use  . Smoking status: Former Smoker    Packs/day: 0.50    Years: 5.00    Pack years: 2.50    Last attempt to quit: 09/30/1971    Years since quitting: 45.8  . Smokeless tobacco: Never Used  Substance Use Topics  . Alcohol use: No  . Drug use: No    Review of Systems Review of systems negative except for pertinent positives and negatives in history of present illness above.     Objective:     Vitals:   08/12/17 0920  BP: 140/76  Pulse: 68  Temp: 98.2 F (36.8 C)  TempSrc: Oral  SpO2: 98%  Weight: 250 lb 3.2 oz (113.5 kg)  Height: _0  (1.905 m)   Body mass index is 31.27 kg/m.  Physical Exam  GEN: appears well, no apparent distress. Head: normocephalic and atraumatic  Eyes: conjunctiva without injection, sclera anicteric CVS: RRR, nl s1 & s2, no murmurs, no edema RESP: no IWOB, good air movement bilaterally, CTAB GI: BS present & normal, soft, NTND GU: no suprapubic or CVA tenderness MSK:  Chest: tenderness to palpation over his right chest proximally. Back & LE exam  Normal skin, spine with normal alignment and no deformity. symmetric  No step offs, no tenderness  to palpation over spines or paraspinous muscles.    Lying and seated SLR normal  Neuro exam in LE: motor 5/5 in all muscle groups, light sensation intact in L4-S1 dermatomes, patellar reflexes 1+ bilaterally. Normal gait  SKIN: no apparent skin lesion NEURO: alert and oiented appropriately, no gross deficits  PSYCH: euthymic mood with congruent affect    Assessment and Plan:  1. Dyspnea on exertion: likely due to anemia given associated  lightheadedness, history of anemia and RA in methotrexate. Unlikely cardiac without history of cardiac disease, orthopnea and PND. Weight has been stable. No history of cardiac disease either. Right sided chest pain is reproducible suggestive for musculoskeletal etiology.  However, patient with history of RA which increases his risk of cardiac disease. So, will check BNP. He has no history of COPD. He quit smoking over 40 years ago. Lung exam within normal limit. - CMP14+EGFR - Brain natriuretic peptide - TSH  2. History of anemia: Last hemoglobin 11.7 about 6 weeks ago.  Normal range MCV suggestive for normocytic anemia.  He has history of rheumatoid arthritis.  He is on methotrexate.  - CBC with Differential/Platelet - Anemia panel  3. DDD (degenerative disc disease), lumbar: back pain is chronic. Pain has improved. No red flag for fracture, infectious etiology, malignancy or cardiac.  Recommended taking Tylenol every 8 hours.  Can use Voltaren gel as needed.  Will avoid systemic NSAIDs given history of hypertension.  4. Type 2 diabetes mellitus with complication, with long-term current use of insulin ([place]): Fairly controlled.  A1c 7.1%.  Will continue metformin.  Return if symptoms worsen or fail to improve.  Mercy Riding, MD 08/12/17 Pager: [phone]

## 2017-08-13 LAB — ANEMIA PANEL
Ferritin: 62 ng/mL (ref 30–400)
Folate, Hemolysate: 353.5 ng/mL
Folate, RBC: 913 ng/mL (ref >498)
HEMATOCRIT: 38.7 % (ref 37.5–51.0)
IRON SATURATION: 46 % (ref 15–55)
Iron: 125 ug/dL (ref 38–169)
RETIC CT PCT: 1 % (ref 0.6–2.6)
TIBC: 273 ug/dL (ref 250–450)
UIBC: 148 ug/dL (ref 111–343)
Vitamin B-12: 649 pg/mL (ref 232–1245)

## 2017-08-13 LAB — CMP14+EGFR
ALK PHOS: 58 IU/L (ref 39–117)
ALT: 15 IU/L (ref 0–44)
AST: 16 IU/L (ref 0–40)
Albumin/Globulin Ratio: 1.4 (ref 1.2–2.2)
Albumin: 3.8 g/dL (ref 3.6–4.8)
BUN/Creatinine Ratio: 11 (ref 10–24)
BUN: 12 mg/dL (ref 8–27)
Bilirubin Total: 0.7 mg/dL (ref 0.0–1.2)
CO2: 25 mmol/L (ref 20–29)
CREATININE: 1.08 mg/dL (ref 0.76–1.27)
Calcium: 9.3 mg/dL (ref 8.6–10.2)
Chloride: 104 mmol/L (ref 96–106)
GFR calc Af Amer: 82 mL/min/{1.73_m2} (ref >59)
GFR calc non Af Amer: 71 mL/min/{1.73_m2} (ref >59)
GLOBULIN, TOTAL: 2.8 g/dL (ref 1.5–4.5)
GLUCOSE: 145 mg/dL — AB (ref 65–99)
Potassium: 4.2 mmol/L (ref 3.5–5.2)
SODIUM: 142 mmol/L (ref 134–144)
Total Protein: 6.6 g/dL (ref 6.0–8.5)

## 2017-08-13 LAB — CBC WITH DIFFERENTIAL/PLATELET
BASOS ABS: 0 10*3/uL (ref 0.0–0.2)
Basos: 0 %
EOS (ABSOLUTE): 0.1 10*3/uL (ref 0.0–0.4)
Eos: 1 %
Hemoglobin: 12.4 g/dL — ABNORMAL LOW (ref 13.0–17.7)
IMMATURE GRANULOCYTES: 0 %
Immature Grans (Abs): 0 10*3/uL (ref 0.0–0.1)
LYMPHS ABS: 1.3 10*3/uL (ref 0.7–3.1)
Lymphs: 18 %
MCH: 29.3 pg (ref 26.6–33.0)
MCHC: 32 g/dL (ref 31.5–35.7)
MCV: 92 fL (ref 79–97)
Monocytes Absolute: 0.3 10*3/uL (ref 0.1–0.9)
Monocytes: 4 %
NEUTROS ABS: 5.5 10*3/uL (ref 1.4–7.0)
NEUTROS PCT: 77 %
PLATELETS: 265 10*3/uL (ref 150–379)
RBC: 4.23 x10E6/uL (ref 4.14–5.80)
RDW: 15.2 % (ref 12.3–15.4)
WBC: 7.2 10*3/uL (ref 3.4–10.8)

## 2017-08-13 LAB — BRAIN NATRIURETIC PEPTIDE: BNP: 3.9 pg/mL (ref 0.0–100.0)

## 2017-08-13 LAB — TSH: TSH: 1.78 u[IU]/mL (ref 0.450–4.500)

## 2017-08-16 ENCOUNTER — Encounter: Payer: Self-pay | Admitting: Student

## 2017-08-16 ENCOUNTER — Telehealth: Payer: Self-pay | Admitting: Student

## 2017-08-16 NOTE — Progress Notes (Signed)
The results of the blood tests we have done at your recent visit are normal except for slightly low hemoglobin which is even better that what it has been. I recommend returning to clinic to discuss about the next step of evaluation for your shortness of breath if it persists. Unfortunately, the phone numbers we have for you are not working to discuss your results over the phone.

## 2017-08-16 NOTE — Telephone Encounter (Signed)
The results of the blood tests we have done at your recent visit are normal except for slightly low hemoglobin which is even better that what it has been. I recommend returning to clinic to discuss about the next step of evaluation for your shortness of breath if it persists. Unfortunately, the phone numbers we have for you are not working to discuss your results over the phone.

## 2017-08-19 NOTE — Telephone Encounter (Signed)
Pt walked into clinic to get results.  Read below message to him.  He denies any SOB and declines an appt.   Also the diclofenac gel will cost him $90 and he cant afford that.  Wants to know if Dr. Cyndia Skeeters has another option or a "discount card".  Will forward to MD  FYI: pts number has been updated in chart. [place], Salome Spotted, CMA

## 2017-08-20 ENCOUNTER — Other Ambulatory Visit: Payer: Self-pay | Admitting: Student

## 2017-08-20 DIAGNOSIS — M5136 Other intervertebral disc degeneration, lumbar region: Secondary | ICD-10-CM

## 2017-08-20 MED ORDER — MELOXICAM 15 MG PO TABS: 15.0000 mg | ORAL_TABLET | Freq: Every day | ORAL | 0 refills | Status: DC

## 2017-08-20 NOTE — Telephone Encounter (Signed)
Attempted to call patient. Unfortunately, he didn't pick up the phone. I have sent a prescription for meloxicam to his pharmacy for his back pain. I also recommend taking extra strength tylenol every 6 hours. He can come back and see [place] if no improvement with the above measures.

## 2017-09-17 ENCOUNTER — Other Ambulatory Visit: Payer: Self-pay | Admitting: *Deleted

## 2017-09-17 DIAGNOSIS — Z79899 Other long term (current) drug therapy: Secondary | ICD-10-CM

## 2017-09-18 LAB — CBC WITH DIFFERENTIAL/PLATELET
BASOS ABS: 32 {cells}/uL (ref 0–200)
Basophils Relative: 0.4 %
EOS ABS: 57 {cells}/uL (ref 15–500)
Eosinophils Relative: 0.7 %
HCT: 37.2 % — ABNORMAL LOW (ref 38.5–50.0)
Hemoglobin: 12.2 g/dL — ABNORMAL LOW (ref 13.2–17.1)
Lymphs Abs: 1418 cells/uL (ref 850–3900)
MCH: 28.4 pg (ref 27.0–33.0)
MCHC: 32.8 g/dL (ref 32.0–36.0)
MCV: 86.5 fL (ref 80.0–100.0)
MONOS PCT: 6 %
MPV: 11 fL (ref 7.5–12.5)
Neutro Abs: 6107 cells/uL (ref 1500–7800)
Neutrophils Relative %: 75.4 %
PLATELETS: 272 10*3/uL (ref 140–400)
RBC: 4.3 10*6/uL (ref 4.20–5.80)
RDW: 13.7 % (ref 11.0–15.0)
TOTAL LYMPHOCYTE: 17.5 %
WBC: 8.1 10*3/uL (ref 3.8–10.8)
WBCMIX: 486 {cells}/uL (ref 200–950)

## 2017-09-18 LAB — COMPLETE METABOLIC PANEL WITH GFR
AG Ratio: 1.4 (calc) (ref 1.0–2.5)
ALBUMIN MSPROF: 3.6 g/dL (ref 3.6–5.1)
ALKALINE PHOSPHATASE (APISO): 54 U/L (ref 40–115)
ALT: 10 U/L (ref 9–46)
AST: 12 U/L (ref 10–35)
BILIRUBIN TOTAL: 0.4 mg/dL (ref 0.2–1.2)
BUN: 12 mg/dL (ref 7–25)
CHLORIDE: 103 mmol/L (ref 98–110)
CO2: 31 mmol/L (ref 20–32)
CREATININE: 1.08 mg/dL (ref 0.70–1.25)
Calcium: 9.2 mg/dL (ref 8.6–10.3)
GFR, Est African American: 82 mL/min/{1.73_m2} (ref >=60)
GFR, Est Non African American: 71 mL/min/{1.73_m2} (ref >=60)
GLUCOSE: 208 mg/dL — AB (ref 65–99)
Globulin: 2.6 g/dL (calc) (ref 1.9–3.7)
Potassium: 4.3 mmol/L (ref 3.5–5.3)
Sodium: 139 mmol/L (ref 135–146)
Total Protein: 6.2 g/dL (ref 6.1–8.1)

## 2017-09-20 ENCOUNTER — Ambulatory Visit (INDEPENDENT_AMBULATORY_CARE_PROVIDER_SITE_OTHER): Payer: Medicare Other | Admitting: Student

## 2017-09-20 ENCOUNTER — Other Ambulatory Visit: Payer: Self-pay

## 2017-09-20 VITALS — BP 160/82 | HR 76 | Temp 98.4°F | Ht 75.0 in | Wt 249.8 lb

## 2017-09-20 DIAGNOSIS — B9789 Other viral agents as the cause of diseases classified elsewhere: Secondary | ICD-10-CM | POA: Diagnosis not present

## 2017-09-20 DIAGNOSIS — J069 Acute upper respiratory infection, unspecified: Secondary | ICD-10-CM | POA: Diagnosis not present

## 2017-09-20 NOTE — Progress Notes (Signed)
Subjective:    Eric Holloway is a 67 y.o. old male here for congestion, sore throat and runny nose HPI Patient reports nasal congestion, runny nose and sore throat for over a week. Also reports cough with blood tinge on it. Denies fever, shortness of breath, chest pain, nausea, vomiting, abdominal pain or diarrhea. He also reports epistaxis. Denies facial tenderness. He has had flu vaccine this season.  He has history of rheumatoid arthritis.  He is on methotrexate.  He also have history of well-controlled diabetes.  He works in the hospital.  Denies tobacco use. PMH/Problem List: has ONYCHOMYCOSIS; T2DM (type 2 diabetes mellitus) ([place]); Mixed hyperlipidemia; OBESITY; HYPERTENSION, BENIGN SYSTEMIC; Rheumatoid arthritis ([place]); LOW BACK PAIN, CHRONIC; PARESTHESIA; PROTEINURIA; PROSTATE CANCER, HX OF; Screening for colorectal cancer; Right ankle pain; Preventive measure; Allergic rhinitis; Erectile dysfunction following radiation therapy; Hearing loss of both ears; Dizziness; Tendinopathy of right shoulder; DJD (degenerative joint disease), cervical; DDD (degenerative disc disease), lumbar; and Cyst of right kidney on their problem list.   has a past medical history of Arthritis, Cataract, Contusion of flank and back (09/17/2011), Diabetes mellitus, Hyperlipidemia, Hypertension, Prostate cancer ([place]) (2005), and Ulcer (1972).  FH:  Family History  Problem Relation Age of Onset  . Stomach cancer Maternal Uncle   . Diabetes Mother   . Hypertension Mother   . Diabetes Father   . Heart attack Sister   . Lupus Brother   . Parkinson's disease Brother   . Colon cancer Neg Hx   . Colon polyps Neg Hx   . Rectal cancer Neg Hx     SH Social History   Tobacco Use  . Smoking status: Former Smoker    Packs/day: 0.50    Years: 5.00    Pack years: 2.50    Last attempt to quit: 09/30/1971    Years since quitting: 46.0  . Smokeless tobacco: Never Used  Substance Use Topics  . Alcohol use: No  . Drug use:  No    Review of Systems Review of systems negative except for pertinent positives and negatives in history of present illness above.     Objective:     Vitals:   09/20/17 1001  BP: (!) 160/82  Pulse: 76  Temp: 98.4 F (36.9 C)  TempSrc: Oral  SpO2: 97%  Weight: 249 lb 12.8 oz (113.3 kg)  Height: 6\' 3"  (1.905 m)   Body mass index is 31.22 kg/m.  Physical Exam  GEN: appears well, no apparent distress. Eyes: conjunctiva without injection, sclera anicteric Ears: external ear and ear canal normal Nares: Positive for some rhinorrhea and nasal congestion, mainly on the right Oropharynx: mmm without erythema, exudation or petechiae.  Uvula midline HEM: negative for cervical or periauricular lymphadenopathies CVS: RRR, nl s1 & s2, no murmurs, no edema RESP: no IWOB, good air movement bilaterally, CTAB SKIN: no apparent skin lesion NEURO: alert and oiented appropriately, no gross deficits  PSYCH: euthymic mood with congruent affect    Assessment and Plan:  1. Viral URI with cough: history and exam suggestive for viral URTI.  Oropharyngeal and lung exam within normal limits.  Low suspicion for strep pharyngitis or  pneumonia.  He reports some blood streak on his phlegm likely from his epistaxis due to viral URI.  He appears well, and has no respiratory distress.  -Recommended conservative management including rest and adequate hydration -Discussed return precautions including but not limited to shortness of breath or increased working of breathing, severe persistent cough or hemoptysis, persistent fever over  101F, not tolerating fluids by mouth or other symptoms concerning to him  Return if symptoms worsen or fail to improve.  Mercy Riding, MD 09/20/17 Pager: [phone]

## 2017-09-20 NOTE — Patient Instructions (Signed)
It appears that you have a viral upper respiratory infection (Common Cold).  I do not believe you have bacterial infection.  cold symptoms typically peak at 3-4 days of illness and then gradually improve over 10-14 days. However, a cough may last 3-5 weeks.   - A tablespoonful of honey before bedtime is helpful for cough - You can also try Mucinex which is available over-the-counter. - Get plenty of rest and adequate hydration. - Consume warm fluids (soup or tea). It relieves stuffy nose, and to loosen phlegm. - Can try saline nasal spray or a Neti Pot for stuffy nose  CONTACT YOUR DOCTOR IF YOU EXPERIENCE ANY OF THE FOLLOWING: - High fever, chest pain, shortness of breath or  not able to keep down food or fluids.  - Cough that gets worse while other cold symptoms improve - Flare up of any chronic lung problem, such as asthma - Your symptoms persist longer than 2 weeks

## 2017-09-23 ENCOUNTER — Other Ambulatory Visit: Payer: Self-pay | Admitting: Rheumatology

## 2017-09-23 DIAGNOSIS — M069 Rheumatoid arthritis, unspecified: Secondary | ICD-10-CM

## 2017-09-23 NOTE — Telephone Encounter (Signed)
Last Visit: 06/23/17 Next Visit: 11/22/17 Labs: 09/17/17 Glucose is elevated.  Anemia is stable. All other labs are WNL  Okay to refill per Dr. Estanislado Pandy
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
## 2017-11-22 ENCOUNTER — Ambulatory Visit: Payer: Medicare Other | Admitting: Physician Assistant

## 2017-11-22 ENCOUNTER — Encounter: Payer: Self-pay | Admitting: Physician Assistant

## 2017-11-22 VITALS — BP 150/79 | HR 74 | Resp 16 | Ht 75.0 in | Wt 252.0 lb

## 2017-11-22 DIAGNOSIS — M19072 Primary osteoarthritis, left ankle and foot: Secondary | ICD-10-CM

## 2017-11-22 DIAGNOSIS — M19041 Primary osteoarthritis, right hand: Secondary | ICD-10-CM | POA: Diagnosis not present

## 2017-11-22 DIAGNOSIS — Z79899 Other long term (current) drug therapy: Secondary | ICD-10-CM

## 2017-11-22 DIAGNOSIS — M5030 Other cervical disc degeneration, unspecified cervical region: Secondary | ICD-10-CM | POA: Diagnosis not present

## 2017-11-22 DIAGNOSIS — Z8679 Personal history of other diseases of the circulatory system: Secondary | ICD-10-CM

## 2017-11-22 DIAGNOSIS — M19071 Primary osteoarthritis, right ankle and foot: Secondary | ICD-10-CM | POA: Diagnosis not present

## 2017-11-22 DIAGNOSIS — M19042 Primary osteoarthritis, left hand: Secondary | ICD-10-CM

## 2017-11-22 DIAGNOSIS — M0579 Rheumatoid arthritis with rheumatoid factor of multiple sites without organ or systems involvement: Secondary | ICD-10-CM | POA: Diagnosis not present

## 2017-11-22 DIAGNOSIS — M5136 Other intervertebral disc degeneration, lumbar region: Secondary | ICD-10-CM

## 2017-11-22 DIAGNOSIS — Z8546 Personal history of malignant neoplasm of prostate: Secondary | ICD-10-CM

## 2017-11-22 DIAGNOSIS — M069 Rheumatoid arthritis, unspecified: Secondary | ICD-10-CM

## 2017-11-22 DIAGNOSIS — Z87448 Personal history of other diseases of urinary system: Secondary | ICD-10-CM

## 2017-11-22 MED ORDER — METHOTREXATE 2.5 MG PO TABS: ORAL_TABLET | ORAL | 0 refills | Status: DC

## 2017-11-22 NOTE — Patient Instructions (Signed)
Standing Labs We placed an order today for your standing lab work.    Please come back and get your standing labs in June and every 3 months  We have open lab Monday through Friday from 8:30-11:30 AM and 1:30-4:00 PM  at the office of Dr. Shaili Deveshwar.   You may experience shorter wait times on Monday and Friday afternoons. The office is located at [address], [address], [place], [place] [address] No appointment is necessary.   Labs are drawn by [facility].  You may receive a bill from [facility] for your lab work. If you have any questions regarding directions or hours of operation,  please call [phone].    

## 2017-12-06 DIAGNOSIS — Z7984 Long term (current) use of oral hypoglycemic drugs: Secondary | ICD-10-CM | POA: Diagnosis not present

## 2017-12-06 DIAGNOSIS — H2513 Age-related nuclear cataract, bilateral: Secondary | ICD-10-CM | POA: Diagnosis not present

## 2017-12-06 DIAGNOSIS — E119 Type 2 diabetes mellitus without complications: Secondary | ICD-10-CM | POA: Diagnosis not present

## 2017-12-06 DIAGNOSIS — H25013 Cortical age-related cataract, bilateral: Secondary | ICD-10-CM | POA: Diagnosis not present

## 2017-12-11 ENCOUNTER — Encounter (HOSPITAL_COMMUNITY): Payer: Self-pay

## 2017-12-11 ENCOUNTER — Ambulatory Visit (HOSPITAL_COMMUNITY)
Admission: EM | Admit: 2017-12-11 | Discharge: 2017-12-11 | Disposition: A | Discharge status: Home / Self Care | Payer: Medicare Other | Attending: Internal Medicine | Admitting: Internal Medicine

## 2017-12-11 DIAGNOSIS — Z7984 Long term (current) use of oral hypoglycemic drugs: Secondary | ICD-10-CM | POA: Insufficient documentation

## 2017-12-11 DIAGNOSIS — Z833 Family history of diabetes mellitus: Secondary | ICD-10-CM | POA: Diagnosis not present

## 2017-12-11 DIAGNOSIS — E669 Obesity, unspecified: Secondary | ICD-10-CM | POA: Insufficient documentation

## 2017-12-11 DIAGNOSIS — Z87891 Personal history of nicotine dependence: Secondary | ICD-10-CM | POA: Insufficient documentation

## 2017-12-11 DIAGNOSIS — Z8546 Personal history of malignant neoplasm of prostate: Secondary | ICD-10-CM | POA: Insufficient documentation

## 2017-12-11 DIAGNOSIS — E782 Mixed hyperlipidemia: Secondary | ICD-10-CM | POA: Insufficient documentation

## 2017-12-11 DIAGNOSIS — Z923 Personal history of irradiation: Secondary | ICD-10-CM | POA: Insufficient documentation

## 2017-12-11 DIAGNOSIS — J209 Acute bronchitis, unspecified: Secondary | ICD-10-CM | POA: Diagnosis not present

## 2017-12-11 DIAGNOSIS — E1136 Type 2 diabetes mellitus with diabetic cataract: Secondary | ICD-10-CM | POA: Diagnosis not present

## 2017-12-11 DIAGNOSIS — M069 Rheumatoid arthritis, unspecified: Secondary | ICD-10-CM | POA: Insufficient documentation

## 2017-12-11 DIAGNOSIS — R12 Heartburn: Secondary | ICD-10-CM | POA: Diagnosis present

## 2017-12-11 DIAGNOSIS — Z79899 Other long term (current) drug therapy: Secondary | ICD-10-CM | POA: Diagnosis not present

## 2017-12-11 DIAGNOSIS — Z7982 Long term (current) use of aspirin: Secondary | ICD-10-CM | POA: Diagnosis not present

## 2017-12-11 DIAGNOSIS — I10 Essential (primary) hypertension: Secondary | ICD-10-CM | POA: Diagnosis not present

## 2017-12-11 DIAGNOSIS — Z8249 Family history of ischemic heart disease and other diseases of the circulatory system: Secondary | ICD-10-CM | POA: Insufficient documentation

## 2017-12-11 DIAGNOSIS — M1990 Unspecified osteoarthritis, unspecified site: Secondary | ICD-10-CM | POA: Insufficient documentation

## 2017-12-11 DIAGNOSIS — K219 Gastro-esophageal reflux disease without esophagitis: Secondary | ICD-10-CM | POA: Diagnosis not present

## 2017-12-11 LAB — POCT RAPID STREP A
STREPTOCOCCUS, GROUP A SCREEN (DIRECT): NEGATIVE
Streptococcus, Group A Screen (Direct): NEGATIVE

## 2017-12-11 MED ORDER — IPRATROPIUM BROMIDE 0.06 % NA SOLN: 2.0000 | Freq: Four times a day (QID) | NASAL | 0 refills | Status: DC

## 2017-12-11 MED ORDER — FLUTICASONE PROPIONATE 50 MCG/ACT NA SUSP: 2.0000 | Freq: Every day | NASAL | 0 refills | Status: DC

## 2017-12-11 MED ORDER — AZITHROMYCIN 250 MG PO TABS: 250.0000 mg | ORAL_TABLET | Freq: Every day | ORAL | 0 refills | Status: DC

## 2017-12-11 NOTE — ED Triage Notes (Signed)
Pt presents with complaints of heart burn with relief at home sometimes with tums, also patient has been congested and feels like something is stuck in his throat x 3 months. Pt is breathing well in triage.
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
## 2017-12-11 NOTE — Discharge Instructions (Signed)
Rapid strep negative.  I am treating you for bronchitis given continued coughing  for the past 3 months, start azithromycin as directed.  Start Flonase, Atrovent nasal spray to help with nasal congestion and  drainage. Tessalon for cough. You can use over the counter nasal saline rinse such as neti pot for nasal congestion. Keep hydrated, your urine should be clear to pale yellow in color. Tylenol/motrin for fever and pain. Monitor for any worsening of symptoms, chest pain, shortness of breath, wheezing, swelling of the throat, follow up for reevaluation.   Your EKG does not show any alarming changes right now.  However, as discussed, would still like you to follow up for evaluation of your heart. You can resume your acid reflux medicine. Your acid reflux could also be due to drainage from your nose, in that case, the nasal sprays will help.

## 2017-12-13 ENCOUNTER — Telehealth (HOSPITAL_COMMUNITY): Payer: Self-pay

## 2017-12-13 LAB — CULTURE, GROUP A STREP (THRC)

## 2017-12-13 NOTE — Telephone Encounter (Signed)
Culture is positive for non group A Strep germ.  This is a finding of uncertain significance; not the typical 'strep throat' germ.  Pt reports feeling better. 

## 2017-12-15 ENCOUNTER — Encounter: Payer: Self-pay | Admitting: Student

## 2017-12-15 DIAGNOSIS — H2513 Age-related nuclear cataract, bilateral: Secondary | ICD-10-CM | POA: Insufficient documentation

## 2017-12-15 DIAGNOSIS — H25013 Cortical age-related cataract, bilateral: Secondary | ICD-10-CM | POA: Insufficient documentation

## 2018-01-17 DIAGNOSIS — R3912 Poor urinary stream: Secondary | ICD-10-CM | POA: Diagnosis not present

## 2018-01-21 ENCOUNTER — Other Ambulatory Visit: Payer: Self-pay | Admitting: *Deleted

## 2018-01-21 DIAGNOSIS — Z8546 Personal history of malignant neoplasm of prostate: Secondary | ICD-10-CM

## 2018-01-21 DIAGNOSIS — E118 Type 2 diabetes mellitus with unspecified complications: Secondary | ICD-10-CM

## 2018-01-21 MED ORDER — ATORVASTATIN CALCIUM 40 MG PO TABS: 40.0000 mg | ORAL_TABLET | Freq: Every day | ORAL | 3 refills | Status: DC

## 2018-01-21 MED ORDER — TAMSULOSIN HCL 0.4 MG PO CAPS: 0.4000 mg | ORAL_CAPSULE | Freq: Every day | ORAL | 3 refills | Status: DC

## 2018-01-24 ENCOUNTER — Telehealth: Payer: Self-pay | Admitting: *Deleted

## 2018-01-24 NOTE — Telephone Encounter (Signed)
LVM to call office back, if he calls back please assist him in getting an appointment scheduled. Katharina Caper, Skylor Hughson D, [place]

## 2018-01-24 NOTE — Telephone Encounter (Signed)
-----   Message from Benay Pike, MD sent at 01/21/2018  4:35 PM EDT ----- Regarding: appointment scheduling Can we set up an appointment soon for Eric Holloway? He asked for a statin refill and I think it's time he get his lipid panel drawn again.  The last one I saw was from 2 years ago.

## 2018-01-25 NOTE — Telephone Encounter (Signed)
Contacted pt and scheduled appointment with PCP. Zimmerman Rumple, April D, CMA  

## 2018-02-14 ENCOUNTER — Ambulatory Visit (INDEPENDENT_AMBULATORY_CARE_PROVIDER_SITE_OTHER): Payer: Medicare Other | Admitting: Family Medicine

## 2018-02-14 ENCOUNTER — Other Ambulatory Visit: Payer: Self-pay

## 2018-02-14 ENCOUNTER — Encounter: Payer: Self-pay | Admitting: Family Medicine

## 2018-02-14 VITALS — BP 140/72 | HR 75 | Temp 98.7°F | Ht 75.0 in | Wt 254.0 lb

## 2018-02-14 DIAGNOSIS — I10 Essential (primary) hypertension: Secondary | ICD-10-CM

## 2018-02-14 DIAGNOSIS — E785 Hyperlipidemia, unspecified: Secondary | ICD-10-CM

## 2018-02-14 DIAGNOSIS — J309 Allergic rhinitis, unspecified: Secondary | ICD-10-CM | POA: Diagnosis not present

## 2018-02-14 DIAGNOSIS — E118 Type 2 diabetes mellitus with unspecified complications: Secondary | ICD-10-CM | POA: Diagnosis not present

## 2018-02-14 DIAGNOSIS — R42 Dizziness and giddiness: Secondary | ICD-10-CM

## 2018-02-14 DIAGNOSIS — Z8546 Personal history of malignant neoplasm of prostate: Secondary | ICD-10-CM | POA: Diagnosis not present

## 2018-02-14 LAB — POCT GLYCOSYLATED HEMOGLOBIN (HGB A1C): HbA1c, POC (controlled diabetic range): 7.5 % — AB (ref 0.0–7.0)

## 2018-02-14 MED ORDER — TAMSULOSIN HCL 0.4 MG PO CAPS: 0.4000 mg | ORAL_CAPSULE | Freq: Every day | ORAL | 3 refills | Status: DC

## 2018-02-14 MED ORDER — FLUTICASONE PROPIONATE 50 MCG/ACT NA SUSP: 2.0000 | Freq: Every day | NASAL | 0 refills | Status: DC

## 2018-02-14 MED ORDER — LISINOPRIL 20 MG PO TABS: ORAL_TABLET | ORAL | 3 refills | Status: DC

## 2018-02-14 MED ORDER — ATORVASTATIN CALCIUM 40 MG PO TABS: 40.0000 mg | ORAL_TABLET | Freq: Every day | ORAL | 3 refills | Status: DC

## 2018-02-14 MED ORDER — METFORMIN HCL 1000 MG PO TABS: ORAL_TABLET | ORAL | 3 refills | Status: DC

## 2018-02-14 NOTE — Progress Notes (Signed)
   [facility] Phone: [phone]  Subjective:  Mr. Demasi came to clinic today for a followup visit of his chronic conditions. He had no problems to discuss other than a chronic dizziness, which he states his rheumatologist told him is a side effect of his methotrexate. It occurs every day and not only when he stands up.  He states he will sometimes have to grab a rail if he feels like he is going to fall. He has not had a syncopal episode.  It does not feel like the room is spinning.   He has been taking all of his prescribed medications and needed several refills today.  He does not report any GI complaints regarding his metformin, nor any chronic cough for his lisinopril.  He reports no issues with urination regarding his tamsulosin. He was prescribed fluticasone and ipratropium a few months ago for a respiratory complaint, but states the fluticasone has been something he was on long term for rhinitis.    ROS: See HPI for pertinent positives and negatives  Past Medical History  Family history reviewed for today's visit. No changes.  Social history- patient is a former smoker  Objective: BP 140/72   Pulse 75   Temp 98.7 F (37.1 C) (Oral)   Ht 6\' 3"  (1.905 m)   Wt 115.2 kg   SpO2 98%   BMI 31.75 kg/m  Gen: NAD, alert, cooperative with exam HEENT: NCAT, EOMI, MMM CV: RRR, no murmur Resp: CTABL, no wheezes, normal work of breathing GI: nontender, BS present, no guarding or organomegaly Msk: No edema, warm, normal tone, moves UE/LE spontaneously Neuro: Alert and oriented, no gross deficits Extremities: diabetic foot exam: full sensation bilaterally, no ulcerations.   Psych: Appropriate behavior  Assessment/Plan: Essential hypertension, benign 140/78 today in clinic.  Takes his lisinopril daily. Recent CMP in April was wnl. Refilled current prescription.   Allergic rhinitis Stable.  Refilled flonase.   T2DM (type 2 diabetes mellitus) (HCC) A1c 7.5  today.  Refilled current metformin prescription.  Advised patient to limit carb intake.  Diabetic foot exam was normal. No loss of sensation or ulceration.  He has already had yearly eye exam.    Hyperlipidemia Taking his atorvostatin.  Continued current prescription.  Lipid panel was ordered.    Personal history of malignant neoplasm of prostate Patient has no complaints regarding this.  Refilled his tamsulosin.    Dizziness This is a chronic, stable problem for patient going back at least two years.  Has not had an actual syncopal episode, just feelings of presyncope.  Will continue to monitor and may need workup in future to rule out cardiogenic causes.      Clemetine Marker, MD PGY-1

## 2018-02-14 NOTE — Assessment & Plan Note (Signed)
A1c 7.5 today.  Refilled current metformin prescription.  Advised patient to limit carb intake.  Diabetic foot exam was normal. No loss of sensation or ulceration.  He has already had yearly eye exam.

## 2018-02-14 NOTE — Assessment & Plan Note (Signed)
Stable. Refilled flonase.  

## 2018-02-14 NOTE — Assessment & Plan Note (Signed)
140/78 today in clinic.  Takes his lisinopril daily. Recent CMP in April was wnl. Refilled current prescription.

## 2018-02-14 NOTE — Patient Instructions (Addendum)
It was nice to meet you Eric Holloway,   Today we talked about the following things:    - refilling your medications: metformin, lisinopril, atorvostatin, and tamsulosin.  Continue taking these as you were before.   - your A1c is 7.5.  This is good but continue to watch what you eat and don't eat a lot of carbohydrates or sugars.   - your methotrexate is prescribed by another doctor so I did not refill this.    - the flonase and the ipratropium were for an acute problem and you no longer need them.    - you are due to have your cholesterol levels checked.  You can get that done today before you leave.    - you will need to have your yearly diabetic eye exam if you have not had one done recently.    Thank you for letting me help you today,   Dr. Jeannine Kitten.

## 2018-02-14 NOTE — Assessment & Plan Note (Signed)
Patient has no complaints regarding this.  Refilled his tamsulosin.

## 2018-02-14 NOTE — Assessment & Plan Note (Signed)
This is a chronic, stable problem for patient going back at least two years.  Has not had an actual syncopal episode, just feelings of presyncope.  Will continue to monitor and may need workup in future to rule out cardiogenic causes.

## 2018-02-14 NOTE — Assessment & Plan Note (Signed)
Taking his atorvostatin.  Continued current prescription.  Lipid panel was ordered.

## 2018-02-15 LAB — LIPID PANEL
CHOLESTEROL TOTAL: 135 mg/dL (ref 100–199)
Chol/HDL Ratio: 3.2 ratio (ref 0.0–5.0)
HDL: 42 mg/dL (ref >39)
LDL Calculated: 74 mg/dL (ref 0–99)
Triglycerides: 94 mg/dL (ref 0–149)
VLDL CHOLESTEROL CAL: 19 mg/dL (ref 5–40)

## 2018-02-18 ENCOUNTER — Other Ambulatory Visit: Payer: Self-pay

## 2018-02-18 DIAGNOSIS — Z79899 Other long term (current) drug therapy: Secondary | ICD-10-CM | POA: Diagnosis not present

## 2018-02-19 LAB — CBC WITH DIFFERENTIAL/PLATELET
BASOS PCT: 0.3 %
Basophils Absolute: 22 cells/uL (ref 0–200)
EOS ABS: 58 {cells}/uL (ref 15–500)
Eosinophils Relative: 0.8 %
HCT: 37.7 % — ABNORMAL LOW (ref 38.5–50.0)
HEMOGLOBIN: 12.2 g/dL — AB (ref 13.2–17.1)
Lymphs Abs: 1584 cells/uL (ref 850–3900)
MCH: 27.9 pg (ref 27.0–33.0)
MCHC: 32.4 g/dL (ref 32.0–36.0)
MCV: 86.3 fL (ref 80.0–100.0)
MPV: 11.3 fL (ref 7.5–12.5)
Monocytes Relative: 6.2 %
NEUTROS ABS: 5090 {cells}/uL (ref 1500–7800)
Neutrophils Relative %: 70.7 %
Platelets: 240 10*3/uL (ref 140–400)
RBC: 4.37 10*6/uL (ref 4.20–5.80)
RDW: 13.9 % (ref 11.0–15.0)
Total Lymphocyte: 22 %
WBC: 7.2 10*3/uL (ref 3.8–10.8)
WBCMIX: 446 {cells}/uL (ref 200–950)

## 2018-02-19 LAB — COMPLETE METABOLIC PANEL WITH GFR
AG Ratio: 1.4 (calc) (ref 1.0–2.5)
ALT: 13 U/L (ref 9–46)
AST: 14 U/L (ref 10–35)
Albumin: 3.8 g/dL (ref 3.6–5.1)
Alkaline phosphatase (APISO): 55 U/L (ref 40–115)
BUN: 14 mg/dL (ref 7–25)
CALCIUM: 9.2 mg/dL (ref 8.6–10.3)
CO2: 30 mmol/L (ref 20–32)
Chloride: 106 mmol/L (ref 98–110)
Creat: 1.11 mg/dL (ref 0.70–1.25)
GFR, EST NON AFRICAN AMERICAN: 68 mL/min/{1.73_m2} (ref >=60)
GFR, Est African American: 79 mL/min/{1.73_m2} (ref >=60)
Globulin: 2.8 g/dL (calc) (ref 1.9–3.7)
Glucose, Bld: 135 mg/dL — ABNORMAL HIGH (ref 65–99)
Potassium: 4.3 mmol/L (ref 3.5–5.3)
Sodium: 140 mmol/L (ref 135–146)
Total Bilirubin: 0.4 mg/dL (ref 0.2–1.2)
Total Protein: 6.6 g/dL (ref 6.1–8.1)

## 2018-02-25 ENCOUNTER — Telehealth: Payer: Self-pay | Admitting: Family Medicine

## 2018-02-25 DIAGNOSIS — E785 Hyperlipidemia, unspecified: Secondary | ICD-10-CM

## 2018-02-25 DIAGNOSIS — I10 Essential (primary) hypertension: Secondary | ICD-10-CM

## 2018-02-25 DIAGNOSIS — E118 Type 2 diabetes mellitus with unspecified complications: Secondary | ICD-10-CM

## 2018-02-25 DIAGNOSIS — Z8546 Personal history of malignant neoplasm of prostate: Secondary | ICD-10-CM

## 2018-02-25 NOTE — Telephone Encounter (Signed)
Patient came to office stated All RX's sent to [facility] should have been sent to mail order : [facility] [address]. [place], [place].  Please correct this and let patient know when done. Patient also wants to know lab results from last visit. Patient may be reached at [phone].

## 2018-03-01 MED ORDER — FLUTICASONE PROPIONATE 50 MCG/ACT NA SUSP: 2.0000 | Freq: Every day | NASAL | 0 refills | Status: DC

## 2018-03-01 MED ORDER — SILDENAFIL CITRATE 20 MG PO TABS: 20.0000 mg | ORAL_TABLET | Freq: Three times a day (TID) | ORAL | 3 refills | Status: DC

## 2018-03-01 MED ORDER — LISINOPRIL 20 MG PO TABS: ORAL_TABLET | ORAL | 3 refills | Status: DC

## 2018-03-01 MED ORDER — METFORMIN HCL 1000 MG PO TABS: ORAL_TABLET | ORAL | 3 refills | Status: DC

## 2018-03-01 MED ORDER — FOLIC ACID 1 MG PO TABS: 2.0000 mg | ORAL_TABLET | Freq: Every day | ORAL | 3 refills | Status: AC

## 2018-03-01 MED ORDER — TAMSULOSIN HCL 0.4 MG PO CAPS: 0.4000 mg | ORAL_CAPSULE | Freq: Every day | ORAL | 3 refills | Status: DC

## 2018-03-01 MED ORDER — ATORVASTATIN CALCIUM 40 MG PO TABS: 40.0000 mg | ORAL_TABLET | Freq: Every day | ORAL | 3 refills | Status: DC

## 2018-03-01 NOTE — Telephone Encounter (Signed)
All medications have been sent to appropriate pharmacy. Please call patient and let him know. Dorris Singh, MD  Family Medicine Teaching Service

## 2018-03-01 NOTE — Telephone Encounter (Signed)
Pt informed. Fleeger, Jessica Dawn, CMA  

## 2018-03-04 ENCOUNTER — Telehealth: Payer: Self-pay

## 2018-03-04 NOTE — Telephone Encounter (Signed)
Received fax from [facility] pharmacy, PA needed on Sildenafil.  Clinical questions submitted via Cover My Meds.  Waiting on response, could take up to 72 hours.  Cover My Meds info: Key: [id number]  Danley Danker, RN [facility] RN)

## 2018-03-08 NOTE — Telephone Encounter (Signed)
Denied per CoverMyMeds, reason pasted below. Full letter in prescriber mailbox. Note routed to prescriber to inform. Danley Danker, RN [facility] RN)  Why did we deny your request? We denied this request under Medicare Part D because: Drugs when used for the treatment of sexual dysfunction are excluded from coverage under Medicare rules. Please refer to your Evidence of Coverage (EOC) section that references Part D drug coverage in your pharmacy plan documents for more information.
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
## 2018-04-26 ENCOUNTER — Ambulatory Visit: Payer: Medicare Other | Admitting: Physician Assistant
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
## 2018-05-03 ENCOUNTER — Ambulatory Visit: Payer: Medicare Other | Admitting: Physician Assistant

## 2018-05-03 ENCOUNTER — Encounter: Payer: Self-pay | Admitting: Physician Assistant

## 2018-05-03 VITALS — BP 141/70 | HR 61 | Resp 16 | Ht 75.0 in | Wt 252.4 lb

## 2018-05-03 DIAGNOSIS — M19071 Primary osteoarthritis, right ankle and foot: Secondary | ICD-10-CM | POA: Diagnosis not present

## 2018-05-03 DIAGNOSIS — M19041 Primary osteoarthritis, right hand: Secondary | ICD-10-CM | POA: Diagnosis not present

## 2018-05-03 DIAGNOSIS — M5030 Other cervical disc degeneration, unspecified cervical region: Secondary | ICD-10-CM

## 2018-05-03 DIAGNOSIS — Z8639 Personal history of other endocrine, nutritional and metabolic disease: Secondary | ICD-10-CM

## 2018-05-03 DIAGNOSIS — M19072 Primary osteoarthritis, left ankle and foot: Secondary | ICD-10-CM

## 2018-05-03 DIAGNOSIS — M5136 Other intervertebral disc degeneration, lumbar region: Secondary | ICD-10-CM

## 2018-05-03 DIAGNOSIS — Z87448 Personal history of other diseases of urinary system: Secondary | ICD-10-CM

## 2018-05-03 DIAGNOSIS — M069 Rheumatoid arthritis, unspecified: Secondary | ICD-10-CM

## 2018-05-03 DIAGNOSIS — Z8679 Personal history of other diseases of the circulatory system: Secondary | ICD-10-CM

## 2018-05-03 DIAGNOSIS — Z79899 Other long term (current) drug therapy: Secondary | ICD-10-CM | POA: Diagnosis not present

## 2018-05-03 DIAGNOSIS — M0579 Rheumatoid arthritis with rheumatoid factor of multiple sites without organ or systems involvement: Secondary | ICD-10-CM

## 2018-05-03 DIAGNOSIS — M19042 Primary osteoarthritis, left hand: Secondary | ICD-10-CM

## 2018-05-03 DIAGNOSIS — Z8546 Personal history of malignant neoplasm of prostate: Secondary | ICD-10-CM

## 2018-05-03 MED ORDER — METHOTREXATE 2.5 MG PO TABS: ORAL_TABLET | ORAL | 0 refills | Status: DC

## 2018-05-03 NOTE — Patient Instructions (Signed)
Standing Labs We placed an order today for your standing lab work.    Please come back and get your standing labs in February and every 3 months  We have open lab Monday through Friday from 8:30-11:30 AM and 1:30-4:00 PM  at the office of Dr. Shaili Deveshwar.   You may experience shorter wait times on Monday and Friday afternoons. The office is located at [address], [address], [place], [place] [address] No appointment is necessary.   Labs are drawn by [facility].  You may receive a bill from [facility] for your lab work. If you have any questions regarding directions or hours of operation,  please call [phone].   Just as a reminder please drink plenty of water prior to coming for your lab work. Thanks!  

## 2018-05-04 LAB — CBC WITH DIFFERENTIAL/PLATELET
BASOS PCT: 0.3 %
Basophils Absolute: 23 cells/uL (ref 0–200)
Eosinophils Absolute: 173 cells/uL (ref 15–500)
Eosinophils Relative: 2.3 %
HEMATOCRIT: 37.5 % — AB (ref 38.5–50.0)
Hemoglobin: 12.4 g/dL — ABNORMAL LOW (ref 13.2–17.1)
LYMPHS ABS: 1500 {cells}/uL (ref 850–3900)
MCH: 28.7 pg (ref 27.0–33.0)
MCHC: 33.1 g/dL (ref 32.0–36.0)
MCV: 86.8 fL (ref 80.0–100.0)
MPV: 10.8 fL (ref 7.5–12.5)
Monocytes Relative: 6.7 %
Neutro Abs: 5303 cells/uL (ref 1500–7800)
Neutrophils Relative %: 70.7 %
PLATELETS: 245 10*3/uL (ref 140–400)
RBC: 4.32 10*6/uL (ref 4.20–5.80)
RDW: 13.5 % (ref 11.0–15.0)
Total Lymphocyte: 20 %
WBC: 7.5 10*3/uL (ref 3.8–10.8)
WBCMIX: 503 {cells}/uL (ref 200–950)

## 2018-05-04 LAB — COMPLETE METABOLIC PANEL WITH GFR
AG Ratio: 1.2 (calc) (ref 1.0–2.5)
ALBUMIN MSPROF: 3.6 g/dL (ref 3.6–5.1)
ALKALINE PHOSPHATASE (APISO): 54 U/L (ref 40–115)
ALT: 9 U/L (ref 9–46)
AST: 13 U/L (ref 10–35)
BUN: 14 mg/dL (ref 7–25)
CALCIUM: 9.5 mg/dL (ref 8.6–10.3)
CO2: 28 mmol/L (ref 20–32)
CREATININE: 1.11 mg/dL (ref 0.70–1.25)
Chloride: 106 mmol/L (ref 98–110)
GFR, EST NON AFRICAN AMERICAN: 68 mL/min/{1.73_m2} (ref >=60)
GFR, Est African American: 79 mL/min/{1.73_m2} (ref >=60)
GLOBULIN: 2.9 g/dL (ref 1.9–3.7)
Glucose, Bld: 169 mg/dL — ABNORMAL HIGH (ref 65–99)
Potassium: 4.2 mmol/L (ref 3.5–5.3)
SODIUM: 144 mmol/L (ref 135–146)
Total Bilirubin: 0.5 mg/dL (ref 0.2–1.2)
Total Protein: 6.5 g/dL (ref 6.1–8.1)

## 2018-05-04 NOTE — Progress Notes (Signed)
CBC stable. Glucose is 169. All other labs are WNL.

## 2018-05-15 ENCOUNTER — Ambulatory Visit (HOSPITAL_COMMUNITY)
Admission: EM | Admit: 2018-05-15 | Discharge: 2018-05-15 | Disposition: A | Discharge status: Home / Self Care | Payer: Medicare Other | Attending: Physician Assistant | Admitting: Physician Assistant

## 2018-05-15 ENCOUNTER — Encounter (HOSPITAL_COMMUNITY): Payer: Self-pay | Admitting: Emergency Medicine

## 2018-05-15 DIAGNOSIS — J029 Acute pharyngitis, unspecified: Secondary | ICD-10-CM | POA: Diagnosis not present

## 2018-05-15 DIAGNOSIS — J329 Chronic sinusitis, unspecified: Secondary | ICD-10-CM

## 2018-05-15 MED ORDER — AMOXICILLIN 500 MG PO CAPS: 500.0000 mg | ORAL_CAPSULE | Freq: Three times a day (TID) | ORAL | 0 refills | Status: DC
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
## 2018-05-15 NOTE — ED Triage Notes (Signed)
Pt sts cough and URI sx x 2 days

## 2018-07-26 ENCOUNTER — Other Ambulatory Visit: Payer: Self-pay

## 2018-07-26 DIAGNOSIS — Z79899 Other long term (current) drug therapy: Secondary | ICD-10-CM | POA: Diagnosis not present

## 2018-07-26 LAB — COMPLETE METABOLIC PANEL WITH GFR
AG Ratio: 1.3 (calc) (ref 1.0–2.5)
ALKALINE PHOSPHATASE (APISO): 50 U/L (ref 40–115)
ALT: 14 U/L (ref 9–46)
AST: 14 U/L (ref 10–35)
Albumin: 3.5 g/dL — ABNORMAL LOW (ref 3.6–5.1)
BUN: 14 mg/dL (ref 7–25)
CO2: 28 mmol/L (ref 20–32)
CREATININE: 1.15 mg/dL (ref 0.70–1.25)
Calcium: 9.1 mg/dL (ref 8.6–10.3)
Chloride: 105 mmol/L (ref 98–110)
GFR, EST NON AFRICAN AMERICAN: 65 mL/min/{1.73_m2} (ref >=60)
GFR, Est African American: 75 mL/min/{1.73_m2} (ref >=60)
GLUCOSE: 156 mg/dL — AB (ref 65–99)
Globulin: 2.6 g/dL (calc) (ref 1.9–3.7)
Potassium: 4.2 mmol/L (ref 3.5–5.3)
Sodium: 141 mmol/L (ref 135–146)
Total Bilirubin: 0.5 mg/dL (ref 0.2–1.2)
Total Protein: 6.1 g/dL (ref 6.1–8.1)

## 2018-07-26 LAB — CBC WITH DIFFERENTIAL/PLATELET
ABSOLUTE MONOCYTES: 671 {cells}/uL (ref 200–950)
BASOS PCT: 0.3 %
Basophils Absolute: 23 cells/uL (ref 0–200)
Eosinophils Absolute: 70 cells/uL (ref 15–500)
Eosinophils Relative: 0.9 %
HCT: 35.5 % — ABNORMAL LOW (ref 38.5–50.0)
HEMOGLOBIN: 11.7 g/dL — AB (ref 13.2–17.1)
LYMPHS ABS: 1193 {cells}/uL (ref 850–3900)
MCH: 28.7 pg (ref 27.0–33.0)
MCHC: 33 g/dL (ref 32.0–36.0)
MCV: 87.2 fL (ref 80.0–100.0)
MPV: 10.8 fL (ref 7.5–12.5)
Monocytes Relative: 8.6 %
NEUTROS ABS: 5842 {cells}/uL (ref 1500–7800)
Neutrophils Relative %: 74.9 %
PLATELETS: 245 10*3/uL (ref 140–400)
RBC: 4.07 10*6/uL — AB (ref 4.20–5.80)
RDW: 13.6 % (ref 11.0–15.0)
Total Lymphocyte: 15.3 %
WBC: 7.8 10*3/uL (ref 3.8–10.8)

## 2018-07-27 NOTE — Progress Notes (Signed)
Glucose is elevated.  Rest of the labs are stable.

## 2018-07-28 ENCOUNTER — Ambulatory Visit: Payer: Medicare Other | Admitting: Podiatry

## 2018-08-10 ENCOUNTER — Other Ambulatory Visit: Payer: Self-pay | Admitting: Physician Assistant

## 2018-08-10 ENCOUNTER — Telehealth: Payer: Self-pay | Admitting: Rheumatology

## 2018-08-10 DIAGNOSIS — M069 Rheumatoid arthritis, unspecified: Secondary | ICD-10-CM

## 2018-08-10 NOTE — Telephone Encounter (Signed)
Patient left a voicemail stating he needs "sample medications" and requested a return call.

## 2018-08-10 NOTE — Telephone Encounter (Signed)
Last Visit: 05/03/18 Next visit: 10/04/18 Labs: 07/26/18 Glucose is elevated. Rest of the labs are stable.  Okay to refill per Dr. Estanislado Pandy

## 2018-08-10 NOTE — Telephone Encounter (Signed)
Patient states he needs a refill on MTX. Patient advised refill was sent to the pharmacy today.

## 2018-08-22 ENCOUNTER — Other Ambulatory Visit: Payer: Self-pay

## 2018-08-22 ENCOUNTER — Encounter: Payer: Self-pay | Admitting: Family Medicine

## 2018-08-22 ENCOUNTER — Ambulatory Visit (INDEPENDENT_AMBULATORY_CARE_PROVIDER_SITE_OTHER): Payer: Medicare Other | Admitting: Family Medicine

## 2018-08-22 VITALS — BP 140/70 | HR 70 | Temp 98.1°F | Ht 75.0 in | Wt 253.6 lb

## 2018-08-22 DIAGNOSIS — M722 Plantar fascial fibromatosis: Secondary | ICD-10-CM | POA: Diagnosis not present

## 2018-08-22 DIAGNOSIS — R0600 Dyspnea, unspecified: Secondary | ICD-10-CM

## 2018-08-22 DIAGNOSIS — R0609 Other forms of dyspnea: Secondary | ICD-10-CM

## 2018-08-22 DIAGNOSIS — E119 Type 2 diabetes mellitus without complications: Secondary | ICD-10-CM | POA: Diagnosis not present

## 2018-08-22 DIAGNOSIS — R42 Dizziness and giddiness: Secondary | ICD-10-CM | POA: Diagnosis not present

## 2018-08-22 HISTORY — DX: Dyspnea, unspecified: R06.00

## 2018-08-22 LAB — POCT GLYCOSYLATED HEMOGLOBIN (HGB A1C): HbA1c, POC (controlled diabetic range): 7.6 % — AB (ref 0.0–7.0)

## 2018-08-22 MED ORDER — OMEPRAZOLE 20 MG PO CPDR
20.0000 mg | DELAYED_RELEASE_CAPSULE | Freq: Every day | ORAL | 3 refills | Status: DC
Start: 2018-08-22 — End: 2019-01-10

## 2018-08-22 NOTE — Patient Instructions (Addendum)
It was great to see you again.    Your diabetes is about where it was last time.  Your A1c was 7.6 today.  I don't think we need to make any changes regarding your medications.    Your blood pressure looks good.  I don't think we need to adjust your Blood pressure medications today.    For your shortness of breath, I don't think it is because of lung or heart problems, and I don't think you are anemic based on your recent blood tests.  You may have some acid reflux which could be why the TUMS makes it feel better.  I'd like to start you on a medicine called a PPI which can do the same thing as TUMS but is more effective.    Your foot pain is likely from plantar fasciitis. I have included some information about it below.    I'd like to see you again in six weeks to see if the PPI has helped with your shortness of breath.     Plantar Fasciitis  Plantar fasciitis is a painful foot condition that affects the heel. It occurs when the band of tissue that connects the toes to the heel bone (plantar fascia) becomes irritated. This can happen as the result of exercising too much or doing other repetitive activities (overuse injury). The pain from plantar fasciitis can range from mild irritation to severe pain that makes it difficult to walk or move. The pain is usually worse in the morning after sleeping, or after sitting or lying down for a while. Pain may also be worse after long periods of walking or standing. What are the causes? This condition may be caused by:  Standing for long periods of time.  Wearing shoes that do not have good arch support.  Doing activities that put stress on joints (high-impact activities), including running, aerobics, and ballet.  Being overweight.  An abnormal way of walking (gait).  Tight muscles in the back of your lower leg (calf).  High arches in your feet.  Starting a new athletic activity. What are the signs or symptoms? The main symptom of this  condition is heel pain. Pain may:  Be worse with first steps after a time of rest, especially in the morning after sleeping or after you have been sitting or lying down for a while.  Be worse after long periods of standing still.  Decrease after 30-45 minutes of activity, such as gentle walking. How is this diagnosed? This condition may be diagnosed based on your medical history and your symptoms. Your health care provider may ask questions about your activity level. Your health care provider will do a physical exam to check for:  A tender area on the bottom of your foot.  A high arch in your foot.  Pain when you move your foot.  Difficulty moving your foot. You may have imaging tests to confirm the diagnosis, such as:  X-rays.  Ultrasound.  MRI. How is this treated? Treatment for plantar fasciitis depends on how severe your condition is. Treatment may include:  Rest, ice, applying pressure (compression), and raising the affected foot (elevation). This may be called RICE therapy. Your health care provider may recommend RICE therapy along with over-the-counter pain medicines to manage your pain.  Exercises to stretch your calves and your plantar fascia.  A splint that holds your foot in a stretched, upward position while you sleep (night splint).  Physical therapy to relieve symptoms and prevent problems in the  future.  Injections of steroid medicine (cortisone) to relieve pain and inflammation.  Stimulating your plantar fascia with electrical impulses (extracorporeal shock wave therapy). This is usually the last treatment option before surgery.  Surgery, if other treatments have not worked after 12 months. Follow these instructions at home:  Managing pain, stiffness, and swelling  If directed, put ice on the painful area: ? Put ice in a plastic bag, or use a frozen bottle of water. ? Place a towel between your skin and the bag or bottle. ? Roll the bottom of your foot  over the bag or bottle. ? Do this for 20 minutes, 2-3 times a day.  Wear athletic shoes that have air-sole or gel-sole cushions, or try wearing soft shoe inserts that are designed for plantar fasciitis.  Raise (elevate) your foot above the level of your heart while you are sitting or lying down. Activity  Avoid activities that cause pain. Ask your health care provider what activities are safe for you.  Do physical therapy exercises and stretches as told by your health care provider.  Try activities and forms of exercise that are easier on your joints (low-impact). Examples include swimming, water aerobics, and biking. General instructions  Take over-the-counter and prescription medicines only as told by your health care provider.  Wear a night splint while sleeping, if told by your health care provider. Loosen the splint if your toes tingle, become numb, or turn cold and blue.  Maintain a healthy weight, or work with your health care provider to lose weight as needed.  Keep all follow-up visits as told by your health care provider. This is important. Contact a health care provider if you:  Have symptoms that do not go away after caring for yourself at home.  Have pain that gets worse.  Have pain that affects your ability to move or do your daily activities. Summary  Plantar fasciitis is a painful foot condition that affects the heel. It occurs when the band of tissue that connects the toes to the heel bone (plantar fascia) becomes irritated.  The main symptom of this condition is heel pain that may be worse after exercising too much or standing still for a long time.  Treatment varies, but it usually starts with rest, ice, compression, and elevation (RICE therapy) and over-the-counter medicines to manage pain. This information is not intended to replace advice given to you by your health care provider. Make sure you discuss any questions you have with your health care  provider. Document Released: 03/17/2001 Document Revised: 04/19/2017 Document Reviewed: 04/19/2017 Elsevier Interactive Patient Education  2019 [employer].

## 2018-08-22 NOTE — Assessment & Plan Note (Signed)
He still feels 'light headed' even when sitting down.  He states he has become used to it.  Do not believe there is a cardiovascular cause based on physical exam.  Possibly related to his methotrexate use.

## 2018-08-22 NOTE — Assessment & Plan Note (Signed)
This is a new problem for the past 4-6 weeks.  Worse when walking around or working.  Unlikely pulmonary given exam findings and not having smoked for 40 years.  Unlikely anemia as he had recently had a cbc with Hgb > 11.  Unlikely cardiac given normal cardiac exam and lack of of edema or pulmonary crackles.  Does not have orthopnea or PND.  Could be due to GERD given that he states taking TUMS relieves his symptoms.  - prilosec 20mg  qdaily - f/u in 6 weeks.   - if symptoms not improved, then consider further cardiac workup: BNP, TTE.

## 2018-08-22 NOTE — Assessment & Plan Note (Signed)
A1c 7.6, up from 7.5 4 months ago.  Will continue with metformin 1000mg  BID and continue to monitor A1c

## 2018-08-22 NOTE — Assessment & Plan Note (Signed)
He felt a pain on his right foot.  Pain is plantar arch more proximal to his heel.  This went away after he switched shoes.  Most likely plantar fasciitis.  Gave patient information regarding this condition and how to manage it.

## 2018-08-22 NOTE — Progress Notes (Signed)
[facility] Phone: [phone]   cc: dyspnea  Subjective:   Fatigue: he describes it as shortness of breath. It 'just comes on' when he's working or walking.  It causes him to sttop and catcch his breath. He takes Nurse, adult and this helps.  No chest pain.  No burning in his chest.  He has had a dry cough for the past 6 weeks. He takes mucinex the morning.  He sleeps on one pillow.  He does not wake up at night feeling short of breath.  He states he feels tired all the time.  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  Then sleeps again at 11am and wake up again at 130.  10-12 years he's been doing this.    Foot pain: patient had pain on his right foot on the plantar side, proximal to his heel  He states that he changed shoes and now it feels better.   Diabetes: patient stated he has been eating more 'red meat' lately because he was worried his B12 was low.    Dizziness: Mr. Mahl Still gets dizzy.  This is chronic.  He can feel it now when he's sitting down.  Its Worse when standing up.  He describes it as feeling lightheaded.    RA: hasn't had a whole lot of problems with his arthritis.  Back doesn't hurt, shoulders don't hurt either   ROS: See HPI for pertinent positives and negatives  Past Medical History  Family history reviewed for today's visit. No changes.  Social history- patient is a non smoker  Objective: BP 140/70   Pulse 70   Temp 98.1 F (36.7 C) (Oral)   Ht 6\' 3"  (1.905 m)   Wt 253 lb 9.6 oz (115 kg)   SpO2 99%   BMI 31.70 kg/m  Gen: NAD, alert and oriented, cooperative with exam CV: normal rate, regular rhythm. No murmurs, no rubs. No lower extremity edema.  Resp: LCTAB, no wheezes, crackles. normal work of breathing.  100% ambulatory pulse ox GI: nontender to palpation, BS present, no guarding or organomegaly Msk: No edema, warm, normal tone, moves UE/LE spontaneously Psych: Appropriate  behavior  Assessment/Plan: Plantar fasciitis He felt a pain on his right foot.  Pain is plantar arch more proximal to his heel.  This went away after he switched shoes.  Most likely plantar fasciitis.  Gave patient information regarding this condition and how to manage it.    Dizziness He still feels 'light headed' even when sitting down.  He states he has become used to it.  Do not believe there is a cardiovascular cause based on physical exam.  Possibly related to his methotrexate use.    T2DM (type 2 diabetes mellitus) (HCC) A1c 7.6, up from 7.5 4 months ago.  Will continue with metformin 1000mg  BID and continue to monitor A1c  Dyspnea This is a new problem for the past 4-6 weeks.  Worse when walking around or working.  Unlikely pulmonary given exam findings and not having smoked for 40 years.  Unlikely anemia as he had recently had a cbc with Hgb > 11.  Unlikely cardiac given normal cardiac exam and lack of of edema or pulmonary crackles.  Does not have orthopnea or PND.  Could be due to GERD given that he states taking TUMS relieves his symptoms.  - prilosec 20mg  qdaily - f/u in 6 weeks.   -  if symptoms not improved, then consider further cardiac workup: BNP, TTE.      Clemetine Marker, MD PGY-1

## 2018-09-19 ENCOUNTER — Other Ambulatory Visit: Payer: Self-pay

## 2018-09-19 MED ORDER — GLUCOSE BLOOD VI STRP: ORAL_STRIP | 3 refills | Status: DC

## 2018-10-03 NOTE — Progress Notes (Signed)
Virtual Visit via Telephone Note  I connected with Eric Holloway on 10/04/18 at 11:15 AM EDT by telephone and verified that I am speaking with the correct person using two identifiers.   I discussed the limitations, risks, security and privacy concerns of performing an evaluation and management service by telephone and the availability of in person appointments. I also discussed with the patient that there may be a patient responsible charge related to this service. The patient expressed understanding and agreed to proceed.  CC: Medication monitoring   History of Present Illness: Patient is a 68 year old male with a past medical history of seropositive rheumatoid arthritis, osteoarthritis, and DDD.  He is taking MTX 4 tablets by mouth on Mondays and 4 tablets on Tuesdays and folic acid 2 mg po daily. He denies any joint pain or joint swelling.  He denies any recent flares.  He feels as though MTX is effective.  He has not missed any doses and is tolerating it well. He denies any neck or lower back pain. He continues to have chronic fatigue.      Review of Systems  Constitutional: Positive for malaise/fatigue. Negative for fever.  HENT:       Reports mouth dryness  Eyes: Negative for photophobia, pain and redness.  Respiratory: Negative for cough, shortness of breath and wheezing.   Cardiovascular: Negative for chest pain and palpitations.  Gastrointestinal: Negative for blood in stool, constipation and diarrhea.  Musculoskeletal: Negative for back pain, joint pain, myalgias and neck pain.  Skin: Negative for rash.  Neurological: Negative for dizziness and headaches.  Psychiatric/Behavioral: Negative for depression. The patient is not nervous/anxious.    Observations/Objective: Physical Exam  Constitutional: He is oriented to person, place, and time.  Neurological: He is alert and oriented to person, place, and time.  Psychiatric: Mood, memory, affect and judgment normal.   Patient  reports morning stiffness for 2 minutes.   Patient denies nocturnal pain.  Difficulty dressing/grooming: Denies Difficulty climbing stairs: Denies Difficulty getting out of chair: Denies Difficulty using hands for taps, buttons, cutlery, and/or writing: Denies   Assessment and Plan: Rheumatoid arthritis involving multiple sites with positive rheumatoid factor ([place]): He has not had any recent rheumatoid arthritis flares.  He has no joint pain or joint swelling.  He has morning stiffness lasting 2 minutes.  He has no difficulty with activities of daily living.  He is taking Methotrexate 4 tablets on Mondays and 4 tablets on Tuesdays and folic acid 2 mg po daily. He has not missed any doses recently.  He will continue on this current treatment regimen.  He does not need any refills at this time.  He was advised to notify us if he develops increased joint pain or joint swelling.  He will follow up in 3 months.   High risk medication use -  MTX 4 tablets by mouth on Mondays and Tuesdays, folic acid 2 mg daily -CBC and CMP were drawn on 07/26/18.  He will return for lab work in April and every 3 months to monitor for drug toxicity.  Standing orders are in place. The importance of good hand hygiene was discussed. If he develops an infection he is advised to hold his dose of MTX.  He was encouraged to avoid NSAIDs during the coronavirus pandemic.    Primary osteoarthritis of both hands: He has no joint pain or joint swelling at this time.  He has no difficulty writing or using silverware.  He has no  difficulty making a fist.    Primary osteoarthritis of both feet: He has no feet pain at this time.   DDD (degenerative disc disease), cervical: He has no neck pain or stiffness at this time.   DDD (degenerative disc disease), lumbar: He has no lower back pain or stiffness at this time.   Follow Up Instructions: He will return for lab work in April and every 3 months. Standing orders are in place.  He  will follow up for an office visit in 3 months.    I discussed the assessment and treatment plan with the patient. The patient was provided an opportunity to ask questions and all were answered. The patient agreed with the plan and demonstrated an understanding of the instructions.   The patient was advised to call back or seek an in-person evaluation if the symptoms worsen or if the condition fails to improve as anticipated.  I provided 15 minutes of non-face-to-face time during this encounter.   Ofilia Neas, PA-C

## 2018-10-04 ENCOUNTER — Telehealth (INDEPENDENT_AMBULATORY_CARE_PROVIDER_SITE_OTHER): Payer: Medicare Other | Admitting: Rheumatology

## 2018-10-04 ENCOUNTER — Encounter: Payer: Self-pay | Admitting: Rheumatology

## 2018-10-04 DIAGNOSIS — M19071 Primary osteoarthritis, right ankle and foot: Secondary | ICD-10-CM | POA: Diagnosis not present

## 2018-10-04 DIAGNOSIS — M19042 Primary osteoarthritis, left hand: Secondary | ICD-10-CM

## 2018-10-04 DIAGNOSIS — M19041 Primary osteoarthritis, right hand: Secondary | ICD-10-CM | POA: Diagnosis not present

## 2018-10-04 DIAGNOSIS — Z87448 Personal history of other diseases of urinary system: Secondary | ICD-10-CM

## 2018-10-04 DIAGNOSIS — M19072 Primary osteoarthritis, left ankle and foot: Secondary | ICD-10-CM | POA: Diagnosis not present

## 2018-10-04 DIAGNOSIS — M0579 Rheumatoid arthritis with rheumatoid factor of multiple sites without organ or systems involvement: Secondary | ICD-10-CM

## 2018-10-04 DIAGNOSIS — Z8639 Personal history of other endocrine, nutritional and metabolic disease: Secondary | ICD-10-CM

## 2018-10-04 DIAGNOSIS — Z8546 Personal history of malignant neoplasm of prostate: Secondary | ICD-10-CM

## 2018-10-04 DIAGNOSIS — M5136 Other intervertebral disc degeneration, lumbar region: Secondary | ICD-10-CM

## 2018-10-04 DIAGNOSIS — Z79899 Other long term (current) drug therapy: Secondary | ICD-10-CM

## 2018-10-04 DIAGNOSIS — Z8679 Personal history of other diseases of the circulatory system: Secondary | ICD-10-CM

## 2018-10-04 DIAGNOSIS — M5030 Other cervical disc degeneration, unspecified cervical region: Secondary | ICD-10-CM

## 2018-10-06 ENCOUNTER — Ambulatory Visit: Payer: Medicare Other | Admitting: Family Medicine

## 2018-10-06 ENCOUNTER — Other Ambulatory Visit: Payer: Self-pay

## 2018-10-18 ENCOUNTER — Ambulatory Visit (HOSPITAL_COMMUNITY)
Admission: EM | Admit: 2018-10-18 | Discharge: 2018-10-18 | Disposition: A | Discharge status: Home / Self Care | Payer: Medicare Other | Attending: Family Medicine | Admitting: Family Medicine

## 2018-10-18 ENCOUNTER — Other Ambulatory Visit: Payer: Self-pay

## 2018-10-18 ENCOUNTER — Encounter (HOSPITAL_COMMUNITY): Payer: Self-pay | Admitting: Emergency Medicine

## 2018-10-18 DIAGNOSIS — B309 Viral conjunctivitis, unspecified: Secondary | ICD-10-CM | POA: Diagnosis not present

## 2018-10-18 MED ORDER — NEOMYCIN-POLYMYXIN-DEXAMETH 3.5-10000-0.1 OP SUSP: 2.0000 [drp] | OPHTHALMIC | 0 refills | Status: DC

## 2018-10-18 NOTE — Discharge Instructions (Signed)
Use the eyedrops as directed Wash hands frequently, especially after touching your eye or face See your eye doctor if not better in a couple of days, or if you are worse at any time instead of better
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
## 2018-10-18 NOTE — ED Triage Notes (Signed)
Pt sts right eye itching and swelling x 3 days

## 2018-10-25 ENCOUNTER — Other Ambulatory Visit: Payer: Self-pay | Admitting: Rheumatology

## 2018-10-25 DIAGNOSIS — M069 Rheumatoid arthritis, unspecified: Secondary | ICD-10-CM

## 2018-10-25 NOTE — Telephone Encounter (Signed)
Last Visit: 10/04/2018 telemedicine  Next Visit: 01/05/2019 Labs: 07/26/2018 Glucose is elevated. Rest of the labs are stable.  Okay to refill per Dr. Estanislado Pandy.

## 2018-11-17 ENCOUNTER — Ambulatory Visit: Payer: Medicare Other | Admitting: Family Medicine

## 2018-11-19 ENCOUNTER — Other Ambulatory Visit: Payer: Self-pay | Admitting: Rheumatology

## 2018-11-19 DIAGNOSIS — M069 Rheumatoid arthritis, unspecified: Secondary | ICD-10-CM

## 2018-11-21 ENCOUNTER — Other Ambulatory Visit: Payer: Self-pay

## 2018-11-21 MED ORDER — FLUTICASONE PROPIONATE 50 MCG/ACT NA SUSP: 2.0000 | Freq: Every day | NASAL | 0 refills | Status: DC

## 2018-11-21 NOTE — Telephone Encounter (Addendum)
Last Visit: 10/04/2018 telemedicine  Next Visit: 01/05/2019 Labs:07/26/2018 Glucose is elevated. Rest of the labs are stable.  Left message to advise patient he is due for labs.  Okay to refill 30 day supply per Dr. Estanislado Pandy

## 2018-11-24 ENCOUNTER — Ambulatory Visit: Payer: Medicare Other | Admitting: Pharmacist

## 2018-11-29 ENCOUNTER — Other Ambulatory Visit: Payer: Self-pay

## 2018-11-29 DIAGNOSIS — Z79899 Other long term (current) drug therapy: Secondary | ICD-10-CM

## 2018-11-30 LAB — CBC WITH DIFFERENTIAL/PLATELET
Absolute Monocytes: 593 cells/uL (ref 200–950)
Basophils Absolute: 23 cells/uL (ref 0–200)
Basophils Relative: 0.3 %
Eosinophils Absolute: 98 cells/uL (ref 15–500)
Eosinophils Relative: 1.3 %
HCT: 37.7 % — ABNORMAL LOW (ref 38.5–50.0)
Hemoglobin: 11.9 g/dL — ABNORMAL LOW (ref 13.2–17.1)
Lymphs Abs: 1328 cells/uL (ref 850–3900)
MCH: 27.5 pg (ref 27.0–33.0)
MCHC: 31.6 g/dL — ABNORMAL LOW (ref 32.0–36.0)
MCV: 87.1 fL (ref 80.0–100.0)
MPV: 11.5 fL (ref 7.5–12.5)
Monocytes Relative: 7.9 %
Neutro Abs: 5460 cells/uL (ref 1500–7800)
Neutrophils Relative %: 72.8 %
Platelets: 222 10*3/uL (ref 140–400)
RBC: 4.33 10*6/uL (ref 4.20–5.80)
RDW: 13.9 % (ref 11.0–15.0)
Total Lymphocyte: 17.7 %
WBC: 7.5 10*3/uL (ref 3.8–10.8)

## 2018-11-30 LAB — COMPLETE METABOLIC PANEL WITH GFR
AG Ratio: 1.4 (calc) (ref 1.0–2.5)
ALT: 10 U/L (ref 9–46)
AST: 12 U/L (ref 10–35)
Albumin: 3.8 g/dL (ref 3.6–5.1)
Alkaline phosphatase (APISO): 52 U/L (ref 35–144)
BUN: 14 mg/dL (ref 7–25)
CO2: 29 mmol/L (ref 20–32)
Calcium: 9.8 mg/dL (ref 8.6–10.3)
Chloride: 105 mmol/L (ref 98–110)
Creat: 1.14 mg/dL (ref 0.70–1.25)
GFR, Est African American: 76 mL/min/{1.73_m2} (ref >=60)
GFR, Est Non African American: 66 mL/min/{1.73_m2} (ref >=60)
Globulin: 2.7 g/dL (calc) (ref 1.9–3.7)
Glucose, Bld: 142 mg/dL — ABNORMAL HIGH (ref 65–99)
Potassium: 4.4 mmol/L (ref 3.5–5.3)
Sodium: 140 mmol/L (ref 135–146)
Total Bilirubin: 0.5 mg/dL (ref 0.2–1.2)
Total Protein: 6.5 g/dL (ref 6.1–8.1)

## 2018-11-30 NOTE — Progress Notes (Signed)
Labs are stable. Glucose is elevated.

## 2018-12-01 ENCOUNTER — Telehealth: Payer: Self-pay | Admitting: *Deleted

## 2018-12-01 NOTE — Telephone Encounter (Signed)
FYI

## 2018-12-01 NOTE — Telephone Encounter (Signed)
Spoke with patient and advised left message yesterday to advised him lab results are stable. Glucose is elevated at 142. Patient verbalized understanding.

## 2018-12-01 NOTE — Telephone Encounter (Signed)
Pt called stating he received a phone call and no message was left, called to see if it was about his lab results. Please call pt back [phone]

## 2018-12-08 ENCOUNTER — Encounter: Payer: Self-pay | Admitting: Family Medicine

## 2018-12-08 ENCOUNTER — Ambulatory Visit (INDEPENDENT_AMBULATORY_CARE_PROVIDER_SITE_OTHER): Payer: Medicare Other | Admitting: Family Medicine

## 2018-12-08 ENCOUNTER — Other Ambulatory Visit: Payer: Self-pay

## 2018-12-08 VITALS — BP 126/76 | HR 80 | Wt 247.0 lb

## 2018-12-08 DIAGNOSIS — E119 Type 2 diabetes mellitus without complications: Secondary | ICD-10-CM

## 2018-12-08 LAB — POCT GLYCOSYLATED HEMOGLOBIN (HGB A1C): HbA1c, POC (controlled diabetic range): 8.3 % — AB (ref 0.0–7.0)

## 2018-12-08 MED ORDER — EMPAGLIFLOZIN 10 MG PO TABS: 10.0000 mg | ORAL_TABLET | Freq: Every day | ORAL | 0 refills | Status: DC

## 2018-12-08 NOTE — Patient Instructions (Addendum)
Your diabetes worsened a little despite the fact that you haven't changed your eating habits or gained weight.  I think starting an additional medication will help get it back under control.    Empagliflozin oral tablets What is this medicine? EMPAGLIFLOZIN (EM pa gli FLOE zin) helps to treat type 2 diabetes. It helps to control blood sugar. This drug may also reduce the risk of heart attack or stroke if you have type 2 diabetes and risk factors for heart disease. Treatment is combined with diet and exercise. This medicine may be used for other purposes; ask your health care provider or pharmacist if you have questions. COMMON BRAND NAME(S): JARDIANCE What should I tell my health care provider before I take this medicine? They need to know if you have any of these conditions: -dehydration -diabetic ketoacidosis -diet low in salt -eating less due to illness, surgery, dieting, or any other reason -having surgery -high cholesterol -high levels of potassium in the blood -history of pancreatitis or pancreas problems -history of yeast infection of the penis or vagina -if you often drink alcohol -infections in the bladder, kidneys, or urinary tract -kidney disease -liver disease -low blood pressure -on hemodialysis -problems urinating -type 1 diabetes -uncircumcised male -an unusual or allergic reaction to empagliflozin, other medicines, foods, dyes, or preservatives -pregnant or trying to get pregnant -breast-feeding How should I use this medicine? Take this medicine by mouth with a glass of water. Follow the directions on the prescription label. Take it in the morning, with or without food. Take your dose at the same time each day. Do not take more often than directed. Do not stop taking except on your doctor's advice. Talk to your pediatrician regarding the use of this medicine in children. Special care may be needed. Overdosage: If you think you have taken too much of this medicine  contact a poison control center or emergency room at once. NOTE: This medicine is only for you. Do not share this medicine with others. What if I miss a dose? If you miss a dose, take it as soon as you can. If it is almost time for your next dose, take only that dose. Do not take double or extra doses. What may interact with this medicine? Do not take this medicine with any of the following medications: -gatifloxacin This medicine may also interact with the following medications: -alcohol -certain medicines for blood pressure, heart disease -diuretics This list may not describe all possible interactions. Give your health care provider a list of all the medicines, herbs, non-prescription drugs, or dietary supplements you use. Also tell them if you smoke, drink alcohol, or use illegal drugs. Some items may interact with your medicine. What should I watch for while using this medicine? Visit your doctor or health care professional for regular checks on your progress. This medicine can cause a serious condition in which there is too much acid in the blood. If you develop nausea, vomiting, stomach pain, unusual tiredness, or breathing problems, stop taking this medicine and call your doctor right away. If possible, use a ketone dipstick to check for ketones in your urine. A test called the HbA1C (A1C) will be monitored. This is a simple blood test. It measures your blood sugar control over the last 2 to 3 months. You will receive this test every 3 to 6 months. Learn how to check your blood sugar. Learn the symptoms of low and high blood sugar and how to manage them. Always carry a quick-source of  sugar with you in case you have symptoms of low blood sugar. Examples include hard sugar candy or glucose tablets. Make sure others know that you can choke if you eat or drink when you develop serious symptoms of low blood sugar, such as seizures or unconsciousness. They must get medical help at once. Tell your  doctor or health care professional if you have high blood sugar. You might need to change the dose of your medicine. If you are sick or exercising more than usual, you might need to change the dose of your medicine. Do not skip meals. Ask your doctor or health care professional if you should avoid alcohol. Many nonprescription cough and cold products contain sugar or alcohol. These can affect blood sugar. Wear a medical ID bracelet or chain, and carry a card that describes your disease and details of your medicine and dosage times. What side effects may I notice from receiving this medicine? Side effects that you should report to your doctor or health care professional as soon as possible: -allergic reactions like skin rash, itching or hives, swelling of the face, lips, or tongue -breathing problems -dizziness -feeling faint or lightheaded, falls -muscle weakness -nausea, vomiting, unusual stomach upset or pain -penile discharge, itching, or pain in men -signs and symptoms of a genital infection, such as fever; tenderness, redness, or swelling in the genitals or area from the genitals to the back of the rectum -signs and symptoms of low blood sugar such as feeling anxious, confusion, dizziness, increased hunger, unusually weak or tired, sweating, shakiness, cold, irritable, headache, blurred vision, fast heartbeat, loss of consciousness -signs and symptoms of a urinary tract infection, such as fever, chills, a burning feeling when urinating, blood in the urine, back pain -trouble passing urine or change in the amount of urine, including an urgent need to urinate more often, in larger amounts, or at night -unusual tiredness -vaginal discharge, itching, or odor in women Side effects that usually do not require medical attention (report to your doctor or health care professional if they continue or are bothersome): -mild increase in urination -thirsty This list may not describe all possible side  effects. Call your doctor for medical advice about side effects. You may report side effects to FDA at 1-800-FDA-1088. Where should I keep my medicine? Keep out of the reach of children. Store at room temperature between 20 and 25 degrees C (68 and 77 degrees F). Throw away any unused medicine after the expiration date. NOTE: This sheet is a summary. It may not cover all possible information. If you have questions about this medicine, talk to your doctor, pharmacist, or health care provider.  2019 [employer]vier/Gold Standard (2017-03-04 10:25:34)

## 2018-12-08 NOTE — Progress Notes (Signed)
   [facility] Phone: [phone]   cc: Diabetes follow-up  Subjective:  Patient has no new complaints since his last visit.  He states he has been taking all of his medications as prescribed.  He has not had any changes in his eating habits.  He has had less exercise, but that is due to COVID restrictions not any personal preference.  Patient was having worsening of his GERD symptoms on his old PPI and is now going back to Prilosec.  He has not had to use any Tums or other antacids since going back to Prilosec.  Objective: BP 126/76   Pulse 80   Wt 247 lb (112 kg)   BMI 30.87 kg/m  Gen: NAD, alert and oriented, cooperative with exam CV: normal rate, regular rhythm. No murmurs, no rubs.  Resp: LCTAB, no wheezes, crackles. normal work of breathing GI: nontender to palpation, BS present, no guarding or organomegaly Psych: Appropriate behavior  Assessment/Plan: T2DM (type 2 diabetes mellitus) ([place]) Patient had a worsening of his A1c in the past year, going from 7.10 a little over 1 year ago to 8.3 today.  This is despite no changes in diet, continuing to take his medications as prescribed, and no weight gain. - Started patient on Jardiance 10 mg, increase to 25 mg in 1 month. -1 month follow-up to discuss tolerance of new medication. - Follow-up A1c in 3 months.    Clemetine Marker, MD PGY-1

## 2018-12-11 NOTE — Assessment & Plan Note (Signed)
Patient had a worsening of his A1c in the past year, going from 7.10 a little over 1 year ago to 8.3 today.  This is despite no changes in diet, continuing to take his medications as prescribed, and no weight gain. - Started patient on Jardiance 10 mg, increase to 25 mg in 1 month. -1 month follow-up to discuss tolerance of new medication. - Follow-up A1c in 3 months.

## 2018-12-13 ENCOUNTER — Telehealth (INDEPENDENT_AMBULATORY_CARE_PROVIDER_SITE_OTHER): Payer: Medicare Other | Admitting: Family Medicine

## 2018-12-13 ENCOUNTER — Other Ambulatory Visit: Payer: Self-pay

## 2018-12-13 ENCOUNTER — Inpatient Hospital Stay (HOSPITAL_COMMUNITY)
Admission: EM | Admit: 2018-12-13 | Discharge: 2018-12-16 | DRG: 178 | Disposition: A | Discharge status: Home / Self Care | Payer: Medicare Other | Attending: Internal Medicine | Admitting: Internal Medicine

## 2018-12-13 ENCOUNTER — Emergency Department (HOSPITAL_COMMUNITY): Payer: Medicare Other

## 2018-12-13 ENCOUNTER — Encounter: Payer: Self-pay | Admitting: Family Medicine

## 2018-12-13 ENCOUNTER — Encounter (HOSPITAL_COMMUNITY): Payer: Self-pay | Admitting: Emergency Medicine

## 2018-12-13 DIAGNOSIS — I951 Orthostatic hypotension: Secondary | ICD-10-CM | POA: Diagnosis not present

## 2018-12-13 DIAGNOSIS — M545 Low back pain, unspecified: Secondary | ICD-10-CM | POA: Diagnosis present

## 2018-12-13 DIAGNOSIS — G8929 Other chronic pain: Secondary | ICD-10-CM | POA: Diagnosis not present

## 2018-12-13 DIAGNOSIS — E860 Dehydration: Secondary | ICD-10-CM | POA: Diagnosis present

## 2018-12-13 DIAGNOSIS — R05 Cough: Secondary | ICD-10-CM

## 2018-12-13 DIAGNOSIS — S299XXA Unspecified injury of thorax, initial encounter: Secondary | ICD-10-CM | POA: Diagnosis not present

## 2018-12-13 DIAGNOSIS — S3992XA Unspecified injury of lower back, initial encounter: Secondary | ICD-10-CM | POA: Diagnosis not present

## 2018-12-13 DIAGNOSIS — Z7984 Long term (current) use of oral hypoglycemic drugs: Secondary | ICD-10-CM | POA: Diagnosis not present

## 2018-12-13 DIAGNOSIS — Z923 Personal history of irradiation: Secondary | ICD-10-CM | POA: Diagnosis not present

## 2018-12-13 DIAGNOSIS — M1990 Unspecified osteoarthritis, unspecified site: Secondary | ICD-10-CM | POA: Diagnosis not present

## 2018-12-13 DIAGNOSIS — R0602 Shortness of breath: Secondary | ICD-10-CM

## 2018-12-13 DIAGNOSIS — E1165 Type 2 diabetes mellitus with hyperglycemia: Secondary | ICD-10-CM | POA: Diagnosis present

## 2018-12-13 DIAGNOSIS — N179 Acute kidney failure, unspecified: Secondary | ICD-10-CM

## 2018-12-13 DIAGNOSIS — R52 Pain, unspecified: Secondary | ICD-10-CM | POA: Diagnosis not present

## 2018-12-13 DIAGNOSIS — Z7982 Long term (current) use of aspirin: Secondary | ICD-10-CM | POA: Diagnosis not present

## 2018-12-13 DIAGNOSIS — E1159 Type 2 diabetes mellitus with other circulatory complications: Secondary | ICD-10-CM | POA: Diagnosis present

## 2018-12-13 DIAGNOSIS — R51 Headache: Secondary | ICD-10-CM | POA: Diagnosis not present

## 2018-12-13 DIAGNOSIS — R55 Syncope and collapse: Secondary | ICD-10-CM | POA: Diagnosis not present

## 2018-12-13 DIAGNOSIS — J80 Acute respiratory distress syndrome: Secondary | ICD-10-CM | POA: Insufficient documentation

## 2018-12-13 DIAGNOSIS — B349 Viral infection, unspecified: Secondary | ICD-10-CM | POA: Diagnosis present

## 2018-12-13 DIAGNOSIS — R059 Cough, unspecified: Secondary | ICD-10-CM

## 2018-12-13 DIAGNOSIS — Z86010 Personal history of colonic polyps: Secondary | ICD-10-CM | POA: Diagnosis not present

## 2018-12-13 DIAGNOSIS — Z833 Family history of diabetes mellitus: Secondary | ICD-10-CM | POA: Diagnosis not present

## 2018-12-13 DIAGNOSIS — N522 Drug-induced erectile dysfunction: Secondary | ICD-10-CM | POA: Diagnosis present

## 2018-12-13 DIAGNOSIS — Z8546 Personal history of malignant neoplasm of prostate: Secondary | ICD-10-CM

## 2018-12-13 DIAGNOSIS — U071 COVID-19: Secondary | ICD-10-CM | POA: Diagnosis not present

## 2018-12-13 DIAGNOSIS — E785 Hyperlipidemia, unspecified: Secondary | ICD-10-CM | POA: Diagnosis not present

## 2018-12-13 DIAGNOSIS — E119 Type 2 diabetes mellitus without complications: Secondary | ICD-10-CM

## 2018-12-13 DIAGNOSIS — Z8249 Family history of ischemic heart disease and other diseases of the circulatory system: Secondary | ICD-10-CM | POA: Diagnosis not present

## 2018-12-13 DIAGNOSIS — H9193 Unspecified hearing loss, bilateral: Secondary | ICD-10-CM | POA: Diagnosis not present

## 2018-12-13 DIAGNOSIS — Z79899 Other long term (current) drug therapy: Secondary | ICD-10-CM | POA: Diagnosis not present

## 2018-12-13 DIAGNOSIS — T508X5S Adverse effect of diagnostic agents, sequela: Secondary | ICD-10-CM | POA: Diagnosis not present

## 2018-12-13 DIAGNOSIS — I10 Essential (primary) hypertension: Secondary | ICD-10-CM | POA: Diagnosis not present

## 2018-12-13 DIAGNOSIS — R0902 Hypoxemia: Secondary | ICD-10-CM | POA: Diagnosis not present

## 2018-12-13 DIAGNOSIS — S199XXA Unspecified injury of neck, initial encounter: Secondary | ICD-10-CM | POA: Diagnosis not present

## 2018-12-13 DIAGNOSIS — Z87891 Personal history of nicotine dependence: Secondary | ICD-10-CM

## 2018-12-13 DIAGNOSIS — R058 Other specified cough: Secondary | ICD-10-CM

## 2018-12-13 DIAGNOSIS — R918 Other nonspecific abnormal finding of lung field: Secondary | ICD-10-CM | POA: Diagnosis not present

## 2018-12-13 DIAGNOSIS — S0990XA Unspecified injury of head, initial encounter: Secondary | ICD-10-CM | POA: Diagnosis not present

## 2018-12-13 DIAGNOSIS — N400 Enlarged prostate without lower urinary tract symptoms: Secondary | ICD-10-CM | POA: Diagnosis present

## 2018-12-13 DIAGNOSIS — W19XXXA Unspecified fall, initial encounter: Secondary | ICD-10-CM | POA: Diagnosis not present

## 2018-12-13 DIAGNOSIS — R509 Fever, unspecified: Secondary | ICD-10-CM

## 2018-12-13 LAB — BASIC METABOLIC PANEL
Anion gap: 11 (ref 5–15)
BUN: 28 mg/dL — ABNORMAL HIGH (ref 8–23)
CO2: 23 mmol/L (ref 22–32)
Calcium: 9.3 mg/dL (ref 8.9–10.3)
Chloride: 102 mmol/L (ref 98–111)
Creatinine, Ser: 1.58 mg/dL — ABNORMAL HIGH (ref 0.61–1.24)
GFR calc Af Amer: 51 mL/min — ABNORMAL LOW (ref >60)
GFR calc non Af Amer: 44 mL/min — ABNORMAL LOW (ref >60)
Glucose, Bld: 165 mg/dL — ABNORMAL HIGH (ref 70–99)
Potassium: 4.3 mmol/L (ref 3.5–5.1)
Sodium: 136 mmol/L (ref 135–145)

## 2018-12-13 LAB — CBC
HCT: 36.2 % — ABNORMAL LOW (ref 39.0–52.0)
Hemoglobin: 11.5 g/dL — ABNORMAL LOW (ref 13.0–17.0)
MCH: 27.9 pg (ref 26.0–34.0)
MCHC: 31.8 g/dL (ref 30.0–36.0)
MCV: 87.9 fL (ref 80.0–100.0)
Platelets: 195 10*3/uL (ref 150–400)
RBC: 4.12 MIL/uL — ABNORMAL LOW (ref 4.22–5.81)
RDW: 14.8 % (ref 11.5–15.5)
WBC: 9.4 10*3/uL (ref 4.0–10.5)
nRBC: 0 % (ref 0.0–0.2)

## 2018-12-13 LAB — MAGNESIUM: Magnesium: 1.8 mg/dL (ref 1.7–2.4)

## 2018-12-13 LAB — CBG MONITORING, ED: Glucose-Capillary: 177 mg/dL — ABNORMAL HIGH (ref 70–99)

## 2018-12-13 MED ORDER — MORPHINE SULFATE (PF) 4 MG/ML IV SOLN: 4.0000 mg | Freq: Once | INTRAVENOUS | Status: DC

## 2018-12-13 MED ORDER — SODIUM CHLORIDE 0.9 % IV SOLN
INTRAVENOUS | Status: DC
  Administered 2018-12-14: 01:00:00 via INTRAVENOUS

## 2018-12-13 MED ORDER — GUAIFENESIN 100 MG/5ML PO SOLN: 5.0000 mL | ORAL | 0 refills | Status: DC | PRN

## 2018-12-13 MED ORDER — ACETAMINOPHEN 325 MG PO TABS
650.0000 mg | ORAL_TABLET | Freq: Once | ORAL | Status: AC
  Administered 2018-12-14: 650 mg via ORAL
  Filled 2018-12-13: qty 2

## 2018-12-13 MED ORDER — SODIUM CHLORIDE 0.9 % IV BOLUS
500.0000 mL | Freq: Once | INTRAVENOUS | Status: AC
  Administered 2018-12-14: 500 mL via INTRAVENOUS

## 2018-12-13 NOTE — ED Triage Notes (Signed)
Pt BIB EMS c/o fall backward down stairs 4 steps and hitting head. Positive LOC found by wife. Pt states no remembering fall. Pt states pain head and upper back rating 7 on scale 0-10.  A&O x4  EMS 1000 mg Tylenol and 100 NS  COVID POSITIVE TriageTemp 103.2

## 2018-12-13 NOTE — Progress Notes (Signed)
[facility] Visit  Patient consented to have virtual visit. Method of visit: Telephone  Encounter participants: Patient: Eric Holloway - located at home due to illness Provider: Martyn Malay - located at family [facility] center Others (if applicable): None  Chief Complaint: Cough at nighttime  HPI:  Eric Holloway is a pleasant 68 year old gentleman with history significant for hypertension dyslipidemia type II by diabetes and rheumatoid arthritis on methotrexate therapy presenting today with complaints of cough.  The patient reports he works in [employer].  He was diagnosed with COVID-19 last week through health at work.  He is currently on day 5 of his symptom course.  He reports overall he actually feels quite well.  He specifically denies difficulty breathing, chest pain, nausea, vomiting, diarrhea.  His biggest complaint is that he cannot sleep at night due to cough.  The cough is dry in nature.  He feels like he cannot get the mucus out of his chest.  He denies lower extremity swelling, orthopnea or paroxysmal nocturnal dyspnea.  He is taking his medications as prescribed.  He feels overall he is getting better but his cough is not. ROS: per HPI  Pertinent PMHx:  Medical history significant for multiple comorbidities including hypertension, diabetes and rheumatoid arthritis which put patient increased risk from complications from COVID.  I discussed this with him  Exam:  Respiratory: Speaking in full sentences breathing comfortably talkative on the phone.  Assessment/Plan: Diagnoses and all orders for this visit:  COVID-19 virus infection patient currently feels like he is doing better.  Speaking in full sentences and doing well on the over the phone evaluation.  I discussed that the evaluation over the phone is limited.  Reviewed reasons to call and return to care at length all questions were answered.  Post-viral cough syndrome discussed  with patient that the normal duration of cough can be anywhere from 7 to 21 days after a viral illness.  We discussed the lack of evidence for therapy such as cough suppressants and Tessalon Perles.  The patient is interested in trying an over-the-counter therapy or herbal therapy.  Recommended honey with tea 3 times a day for the next few days.  Reviewed reasons to call and return to care.  This could also be due to his ACE inhibitor although I think this is less clear in the setting of his current viral illness. -     guaiFENesin (ROBITUSSIN) 100 MG/5ML SOLN; Take 5 mLs (100 mg total) by mouth every 4 (four) hours as needed for cough or to loosen phlegm.    Time spent during visit with patient: 10 minutes No charge as seen last 7 days.

## 2018-12-13 NOTE — ED Notes (Signed)
Pt in CT at this time.
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
## 2018-12-13 NOTE — ED Notes (Signed)
Daughter, Beau Fanny, would like an update as soon as possible at [phone]

## 2018-12-14 ENCOUNTER — Encounter (HOSPITAL_COMMUNITY): Payer: Self-pay

## 2018-12-14 ENCOUNTER — Inpatient Hospital Stay (HOSPITAL_COMMUNITY): Payer: Medicare Other

## 2018-12-14 DIAGNOSIS — Z86010 Personal history of colonic polyps: Secondary | ICD-10-CM | POA: Diagnosis not present

## 2018-12-14 DIAGNOSIS — R05 Cough: Secondary | ICD-10-CM | POA: Diagnosis not present

## 2018-12-14 DIAGNOSIS — E785 Hyperlipidemia, unspecified: Secondary | ICD-10-CM | POA: Diagnosis not present

## 2018-12-14 DIAGNOSIS — B349 Viral infection, unspecified: Secondary | ICD-10-CM | POA: Diagnosis not present

## 2018-12-14 DIAGNOSIS — Z8249 Family history of ischemic heart disease and other diseases of the circulatory system: Secondary | ICD-10-CM | POA: Diagnosis not present

## 2018-12-14 DIAGNOSIS — T508X5S Adverse effect of diagnostic agents, sequela: Secondary | ICD-10-CM | POA: Diagnosis not present

## 2018-12-14 DIAGNOSIS — E860 Dehydration: Secondary | ICD-10-CM | POA: Diagnosis not present

## 2018-12-14 DIAGNOSIS — M545 Low back pain: Secondary | ICD-10-CM | POA: Diagnosis not present

## 2018-12-14 DIAGNOSIS — G8929 Other chronic pain: Secondary | ICD-10-CM | POA: Diagnosis not present

## 2018-12-14 DIAGNOSIS — E1165 Type 2 diabetes mellitus with hyperglycemia: Secondary | ICD-10-CM | POA: Diagnosis not present

## 2018-12-14 DIAGNOSIS — H9193 Unspecified hearing loss, bilateral: Secondary | ICD-10-CM | POA: Diagnosis not present

## 2018-12-14 DIAGNOSIS — N179 Acute kidney failure, unspecified: Secondary | ICD-10-CM | POA: Diagnosis not present

## 2018-12-14 DIAGNOSIS — Z923 Personal history of irradiation: Secondary | ICD-10-CM | POA: Diagnosis not present

## 2018-12-14 DIAGNOSIS — M1990 Unspecified osteoarthritis, unspecified site: Secondary | ICD-10-CM | POA: Diagnosis not present

## 2018-12-14 DIAGNOSIS — J80 Acute respiratory distress syndrome: Secondary | ICD-10-CM

## 2018-12-14 DIAGNOSIS — Z7984 Long term (current) use of oral hypoglycemic drugs: Secondary | ICD-10-CM | POA: Diagnosis not present

## 2018-12-14 DIAGNOSIS — Z79899 Other long term (current) drug therapy: Secondary | ICD-10-CM | POA: Diagnosis not present

## 2018-12-14 DIAGNOSIS — Z833 Family history of diabetes mellitus: Secondary | ICD-10-CM | POA: Diagnosis not present

## 2018-12-14 DIAGNOSIS — I951 Orthostatic hypotension: Secondary | ICD-10-CM | POA: Diagnosis not present

## 2018-12-14 DIAGNOSIS — R55 Syncope and collapse: Secondary | ICD-10-CM | POA: Diagnosis present

## 2018-12-14 DIAGNOSIS — U071 COVID-19: Principal | ICD-10-CM

## 2018-12-14 DIAGNOSIS — Z7982 Long term (current) use of aspirin: Secondary | ICD-10-CM | POA: Diagnosis not present

## 2018-12-14 DIAGNOSIS — I10 Essential (primary) hypertension: Secondary | ICD-10-CM | POA: Diagnosis not present

## 2018-12-14 LAB — PROCALCITONIN: Procalcitonin: 0.16 ng/mL

## 2018-12-14 LAB — GLUCOSE, CAPILLARY
Glucose-Capillary: 146 mg/dL — ABNORMAL HIGH (ref 70–99)
Glucose-Capillary: 177 mg/dL — ABNORMAL HIGH (ref 70–99)
Glucose-Capillary: 229 mg/dL — ABNORMAL HIGH (ref 70–99)
Glucose-Capillary: 289 mg/dL — ABNORMAL HIGH (ref 70–99)

## 2018-12-14 LAB — URINALYSIS, ROUTINE W REFLEX MICROSCOPIC
Bacteria, UA: NONE SEEN
Bilirubin Urine: NEGATIVE
Glucose, UA: NEGATIVE mg/dL
Ketones, ur: NEGATIVE mg/dL
Leukocytes,Ua: NEGATIVE
Nitrite: NEGATIVE
Protein, ur: 100 mg/dL — AB
Specific Gravity, Urine: 1.017 (ref 1.005–1.030)
pH: 5 (ref 5.0–8.0)

## 2018-12-14 LAB — COMPREHENSIVE METABOLIC PANEL
ALT: 25 U/L (ref 0–44)
AST: 36 U/L (ref 15–41)
Albumin: 3.1 g/dL — ABNORMAL LOW (ref 3.5–5.0)
Alkaline Phosphatase: 39 U/L (ref 38–126)
Anion gap: 9 (ref 5–15)
BUN: 24 mg/dL — ABNORMAL HIGH (ref 8–23)
CO2: 26 mmol/L (ref 22–32)
Calcium: 8.8 mg/dL — ABNORMAL LOW (ref 8.9–10.3)
Chloride: 101 mmol/L (ref 98–111)
Creatinine, Ser: 1.22 mg/dL (ref 0.61–1.24)
GFR calc Af Amer: >60 mL/min (ref >60)
GFR calc non Af Amer: >60 mL/min (ref >60)
Glucose, Bld: 138 mg/dL — ABNORMAL HIGH (ref 70–99)
Potassium: 4.1 mmol/L (ref 3.5–5.1)
Sodium: 136 mmol/L (ref 135–145)
Total Bilirubin: 0.8 mg/dL (ref 0.3–1.2)
Total Protein: 6.9 g/dL (ref 6.5–8.1)

## 2018-12-14 LAB — CBC
HCT: 36.7 % — ABNORMAL LOW (ref 39.0–52.0)
Hemoglobin: 11.9 g/dL — ABNORMAL LOW (ref 13.0–17.0)
MCH: 28.8 pg (ref 26.0–34.0)
MCHC: 32.4 g/dL (ref 30.0–36.0)
MCV: 88.9 fL (ref 80.0–100.0)
Platelets: 196 10*3/uL (ref 150–400)
RBC: 4.13 MIL/uL — ABNORMAL LOW (ref 4.22–5.81)
RDW: 15.1 % (ref 11.5–15.5)
WBC: 9.5 10*3/uL (ref 4.0–10.5)
nRBC: 0 % (ref 0.0–0.2)

## 2018-12-14 LAB — ABO/RH: ABO/RH(D): A POS

## 2018-12-14 LAB — LACTATE DEHYDROGENASE: LDH: 258 U/L — ABNORMAL HIGH (ref 98–192)

## 2018-12-14 LAB — HIV ANTIBODY (ROUTINE TESTING W REFLEX): HIV Screen 4th Generation wRfx: NONREACTIVE

## 2018-12-14 LAB — D-DIMER, QUANTITATIVE: D-Dimer, Quant: 2.41 ug/mL-FEU — ABNORMAL HIGH (ref 0.00–0.50)

## 2018-12-14 LAB — FERRITIN: Ferritin: 352 ng/mL — ABNORMAL HIGH (ref 24–336)

## 2018-12-14 LAB — BRAIN NATRIURETIC PEPTIDE: B Natriuretic Peptide: 12.1 pg/mL (ref 0.0–100.0)

## 2018-12-14 LAB — SODIUM, URINE, RANDOM: Sodium, Ur: 124 mmol/L

## 2018-12-14 LAB — SARS CORONAVIRUS 2: SARS Coronavirus 2: DETECTED — AB

## 2018-12-14 LAB — C-REACTIVE PROTEIN: CRP: 7.5 mg/dL — ABNORMAL HIGH (ref <1.0)

## 2018-12-14 LAB — MAGNESIUM: Magnesium: 1.8 mg/dL (ref 1.7–2.4)

## 2018-12-14 MED ORDER — ASPIRIN EC 81 MG PO TBEC
81.0000 mg | DELAYED_RELEASE_TABLET | Freq: Every day | ORAL | Status: DC
  Administered 2018-12-15 – 2018-12-16 (×2): 81 mg via ORAL
  Filled 2018-12-14 (×2): qty 1

## 2018-12-14 MED ORDER — ATORVASTATIN CALCIUM 40 MG PO TABS
40.0000 mg | ORAL_TABLET | Freq: Every day | ORAL | Status: DC
  Administered 2018-12-14 – 2018-12-16 (×3): 40 mg via ORAL
  Filled 2018-12-14 (×3): qty 1

## 2018-12-14 MED ORDER — LACTATED RINGERS IV SOLN
INTRAVENOUS | Status: DC
  Administered 2018-12-14 (×2): via INTRAVENOUS

## 2018-12-14 MED ORDER — ACETAMINOPHEN 325 MG PO TABS
650.0000 mg | ORAL_TABLET | Freq: Four times a day (QID) | ORAL | Status: DC | PRN
  Administered 2018-12-14 – 2018-12-15 (×3): 650 mg via ORAL
  Filled 2018-12-14 (×3): qty 2

## 2018-12-14 MED ORDER — INSULIN ASPART 100 UNIT/ML ~~LOC~~ SOLN
0.0000 [IU] | Freq: Three times a day (TID) | SUBCUTANEOUS | Status: DC
  Administered 2018-12-14: 3 [IU] via SUBCUTANEOUS
  Administered 2018-12-14: 1 [IU] via SUBCUTANEOUS
  Administered 2018-12-14: 2 [IU] via SUBCUTANEOUS
  Administered 2018-12-15: 5 [IU] via SUBCUTANEOUS
  Administered 2018-12-15 – 2018-12-16 (×3): 3 [IU] via SUBCUTANEOUS

## 2018-12-14 MED ORDER — HEPARIN SODIUM (PORCINE) 5000 UNIT/ML IJ SOLN
5000.0000 [IU] | Freq: Three times a day (TID) | INTRAMUSCULAR | Status: DC
  Administered 2018-12-14 – 2018-12-16 (×7): 5000 [IU] via SUBCUTANEOUS
  Filled 2018-12-14 (×7): qty 1

## 2018-12-14 MED ORDER — GUAIFENESIN 100 MG/5ML PO SOLN
10.0000 mL | Freq: Four times a day (QID) | ORAL | Status: DC | PRN
  Administered 2018-12-14: 200 mg via ORAL
  Filled 2018-12-14: qty 15

## 2018-12-14 MED ORDER — FOLIC ACID 1 MG PO TABS
2.0000 mg | ORAL_TABLET | Freq: Every day | ORAL | Status: DC
  Administered 2018-12-14 – 2018-12-16 (×3): 2 mg via ORAL
  Filled 2018-12-14 (×3): qty 2

## 2018-12-14 MED ORDER — INSULIN ASPART 100 UNIT/ML ~~LOC~~ SOLN
0.0000 [IU] | Freq: Every day | SUBCUTANEOUS | Status: DC
  Administered 2018-12-14: 3 [IU] via SUBCUTANEOUS
  Administered 2018-12-15: 2 [IU] via SUBCUTANEOUS

## 2018-12-14 MED ORDER — SODIUM CHLORIDE 0.9 % IV SOLN
INTRAVENOUS | Status: DC
  Administered 2018-12-14: 03:00:00 via INTRAVENOUS

## 2018-12-14 MED ORDER — PANTOPRAZOLE SODIUM 40 MG PO TBEC
40.0000 mg | DELAYED_RELEASE_TABLET | Freq: Every day | ORAL | Status: DC
  Administered 2018-12-14 – 2018-12-16 (×3): 40 mg via ORAL
  Filled 2018-12-14 (×3): qty 1

## 2018-12-14 MED ORDER — METHYLPREDNISOLONE SODIUM SUCC 125 MG IJ SOLR
60.0000 mg | Freq: Two times a day (BID) | INTRAMUSCULAR | Status: DC
  Administered 2018-12-14 – 2018-12-16 (×5): 60 mg via INTRAVENOUS
  Filled 2018-12-14 (×5): qty 2

## 2018-12-14 MED ORDER — TAMSULOSIN HCL 0.4 MG PO CAPS
0.4000 mg | ORAL_CAPSULE | Freq: Every day | ORAL | Status: DC
  Administered 2018-12-14 – 2018-12-15 (×2): 0.4 mg via ORAL
  Filled 2018-12-14 (×2): qty 1

## 2018-12-14 MED ORDER — INSULIN GLARGINE 100 UNIT/ML ~~LOC~~ SOLN
15.0000 [IU] | Freq: Every day | SUBCUTANEOUS | Status: DC
  Administered 2018-12-14 – 2018-12-15 (×2): 15 [IU] via SUBCUTANEOUS
  Filled 2018-12-14 (×2): qty 0.15

## 2018-12-14 MED ORDER — ASPIRIN 81 MG PO CHEW
162.0000 mg | CHEWABLE_TABLET | Freq: Once | ORAL | Status: AC
  Administered 2018-12-14: 162 mg via ORAL
  Filled 2018-12-14: qty 2

## 2018-12-14 MED ORDER — AMLODIPINE BESYLATE 5 MG PO TABS
10.0000 mg | ORAL_TABLET | Freq: Every day | ORAL | Status: DC
  Administered 2018-12-14 – 2018-12-15 (×2): 10 mg via ORAL
  Filled 2018-12-14 (×2): qty 2

## 2018-12-14 MED ORDER — HYDRALAZINE HCL 20 MG/ML IJ SOLN: 10.0000 mg | Freq: Four times a day (QID) | INTRAMUSCULAR | Status: DC | PRN

## 2018-12-14 NOTE — Progress Notes (Signed)
Pt daughter Khaleed Holan called up this am to check on him. Info provided and she had no further questions at this time. Also updated her information in the system.
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
## 2018-12-14 NOTE — ED Notes (Signed)
Daughter Beau Fanny updated per patient request.   Attempted report to [facility].  Nurse to call back when available.

## 2018-12-14 NOTE — Progress Notes (Addendum)
0145: Received patient transported via EMS. Pt is on RA, A&Ox4, NSR, BP WNL, SPO2 on monitor >97% asnd RR 26-40. Pt has 8 out of 10 pain in head and back. Skin assessment and CHG bath performed by this RN and RN Debbie. Reviewing orders now.

## 2018-12-14 NOTE — Evaluation (Signed)
Physical Therapy Evaluation Patient Details Name: Eric Holloway MRN: [id number] DOB: 1950/12/16 Today's Date: 12/14/2018   History of Present Illness  68 y.o. male admitted on 12/13/18 for syncopal episode on his steps at home with known COVID (+).  fever (+), increased RR, but no O2 needs at time of evaluation.  Pt with significant PMH of prostate CA s/p resection, DM.    Clinical Impression  Pt is weak, seems a bit slow to process vs HOH in loud fan room.  He is more stable and looks better on his feet with RW.  Partial orthostatics taken and are as follows:   12/14/18 1104  Vital Signs  Patient Position (if appropriate) Orthostatic Vitals  Orthostatic Lying   BP- Lying  (pt was already OOB)  Orthostatic Sitting  BP- Sitting 131/67  Pulse- Sitting 94  Orthostatic Standing at 0 minutes  BP- Standing at 0 minutes 133/68  Pulse- Standing at 0 minutes 101  Orthostatic Standing at 3 minutes  BP- Standing at 3 minutes 125/64  Pulse- Standing at 3 minutes 102  Oxygen Therapy  SpO2 95 %  O2 Device Room Air    PT to follow acutely for deficits listed below.     Follow Up Recommendations No PT follow up;Supervision for mobility/OOB    Equipment Recommendations  Rolling walker with 5" wheels    Recommendations for Other Services OT consult     Precautions / Restrictions Precautions Precautions: Fall Precaution Comments: recent fall, thought to be syncopal.       Mobility  Bed Mobility               General bed mobility comments: Pt was OOB in the recliner chair.   Transfers Overall transfer level: Needs assistance Equipment used: Rolling walker (2 wheeled) Transfers: Sit to/from Stand Sit to Stand: Min assist         General transfer comment: Min assist to power up from low chair, cues for safe hand placement during transitions.   Ambulation/Gait Ambulation/Gait assistance: Min assist Gait Distance (Feet): 20 Feet Assistive device: Rolling walker (2  wheeled) Gait Pattern/deviations: Step-through pattern;Staggering left;Staggering right Gait velocity: decreased Gait velocity interpretation: 1.31 - 2.62 ft/sec, indicative of limited community ambulator General Gait Details: Pt with mildly staggering gait pattern, cues for safe RW use.  RR increased to mid 30s during gait, no audible DOE, O2 sats stable on RA at mid 90s or higher.          Balance Overall balance assessment: Needs assistance Sitting-balance support: Feet supported;No upper extremity supported Sitting balance-Leahy Scale: Good     Standing balance support: Bilateral upper extremity supported Standing balance-Leahy Scale: Poor Standing balance comment: needs external support in standing for balance and due to weakness.                              Pertinent Vitals/Pain Pain Assessment: Faces Faces Pain Scale: Hurts little more Pain Location: back Pain Descriptors / Indicators: Grimacing;Guarding Pain Intervention(s): Limited activity within patient's tolerance;Monitored during session;Repositioned    Home Living Family/patient expects to be discharged to:: Private residence Living Arrangements: Spouse/significant other Available Help at Discharge: Family;Available 24 hours/day(if needed 24/7, but wife does work. ) Type of Home: House Home Access: Stairs to enter Entrance Stairs-Rails: Right Entrance Stairs-Number of Steps: 7 Home Layout: One level [place]: None      Prior Function Level of Independence: Independent  Comments: works at [employer] in [place].      Hand Dominance        Extremity/Trunk Assessment   Upper Extremity Assessment Upper Extremity Assessment: Defer to OT evaluation    Lower Extremity Assessment Lower Extremity Assessment: Generalized weakness    Cervical / Trunk Assessment Cervical / Trunk Assessment: Other exceptions Cervical / Trunk Exceptions: bruised from fall   Communication   Communication: HOH;Other (comment)(vs loud fan in his room.)  Cognition Arousal/Alertness: Awake/alert Behavior During Therapy: WFL for tasks assessed/performed Overall Cognitive Status: Impaired/Different from baseline Area of Impairment: Problem solving                             Problem Solving: Slow processing General Comments: slow to process, but hard to tell if it is because it is hard to hear or slow processing             Assessment/Plan    PT Assessment Patient needs continued PT services  PT Problem List Decreased strength;Decreased activity tolerance;Decreased balance;Decreased mobility;Decreased knowledge of use of DME;Cardiopulmonary status limiting activity;Pain       PT Treatment Interventions DME instruction;Gait training;Functional mobility training;Stair training;Therapeutic exercise;Therapeutic activities;Balance training;Patient/family education;Modalities    PT Goals (Current goals can be found in the Care Plan section)  Acute Rehab PT Goals Patient Stated Goal: to get home feel better, get back to work PT Goal Formulation: With patient Time For Goal Achievement: 12/28/18 Potential to Achieve Goals: Good    Frequency Min 3X/week           AM-PAC PT "6 Clicks" Mobility  Outcome Measure Help needed turning from your back to your side while in a flat bed without using bedrails?: A Little Help needed moving from lying on your back to sitting on the side of a flat bed without using bedrails?: A Little Help needed moving to and from a bed to a chair (including a wheelchair)?: A Little Help needed standing up from a chair using your arms (e.g., wheelchair or bedside chair)?: A Little Help needed to walk in hospital room?: A Little Help needed climbing 3-5 steps with a railing? : A Little 6 Click Score: 18    End of Session   Activity Tolerance: Patient limited by fatigue Patient left: in chair;with call bell/phone within  reach Nurse Communication: Mobility status PT Visit Diagnosis: Muscle weakness (generalized) (M62.81);Difficulty in walking, not elsewhere classified (R26.2);Pain Pain - Right/Left: (back) Pain - part of body: (back)    Time: 1025-1109(pt on phone with pharm when I came in, -1 unit) PT Time Calculation (min) (ACUTE ONLY): 44 min   Charges:   PT Evaluation $PT Eval Moderate Complexity: 1 Mod PT Treatments $Gait Training: 8-22 mins        Dmari Schubring B. Makaylah Oddo, PT, DPT  Acute Rehabilitation [phone] pager [phone]) [phone] office   Wells Guiles B Cristian Grieves 12/14/2018, 12:22 PM

## 2018-12-14 NOTE — Progress Notes (Signed)
PROGRESS NOTE                                                                                                                                                                                                             Patient Demographics:    Eric Holloway, is a 68 y.o. male, DOB - 1950-08-08, ZOX:[id number]  Outpatient Primary MD for the patient is Benay Pike, MD    LOS - 1  Admit date - 12/13/2018    Chief Complaint  Patient presents with   Fall       Brief Narrative  Patient is a 68 year old African-American male, with past medical history significant for prostate cancer, hypertension, hyperlipidemia, diabetes mellitus, cataract and arthritis.  Apparently, patient was diagnosed with COVID about 4 days ago.  Patient has been isolating himself at home.  Patient had a syncopal episode after exposing himself to excessive sunlight today.   Subjective:    Eric Holloway today has, No headache, No chest pain, No abdominal pain - No Nausea, No new weakness tingling or numbness,  Denies any Cough - SOB.     Assessment  & Plan :      1. Dehydration, syncope and fall due to Acute Covid 19 Viral Illness during the ongoing 2020 Covid 19 Pandemic - he does not seem to be hypoxic and currently chest x-ray stable, head and neck CT along with T and L-spine x-ray are unremarkable as well.  Inflammatory markers are mildly elevated hence he will be started on IV steroids, he will be monitored closely, encouraged him to increase activity, PT eval, monitor orthostatics, gentle IV fluids for hydration and monitor closely.  Currently no focal deficits.   COVID-19 Labs  Recent Labs    12/14/18 0654  DDIMER 2.41*  FERRITIN 352*  LDH 258*  CRP 7.5*    Lab Results  Component Value Date   SARSCOV2NAA DETECTED (A) 12/14/2018     Hepatic Function Latest Ref Rng & Units 12/14/2018 11/29/2018 07/26/2018  Total Protein 6.5 - 8.1 g/dL  6.9 6.5 6.1  Albumin 3.5 - 5.0 g/dL 3.1(L) - -  AST 15 - 41 U/L 36 12 14  ALT 0 - 44 U/L 25 10 14   Alk Phosphatase 38 - 126 U/L 39 - -  Total Bilirubin 0.3 - 1.2 mg/dL 0.8 0.5 0.5  Bilirubin,  Direct 0.0 - 0.3 mg/dL - - -        Component Value Date/Time   BNP 12.1 12/14/2018 0654      2.  Hypertension.  Placed on Norvasc along with as needed hydralazine.    3.  BPH -  alpha-blocker started.  4.  Dyslipidemia.  Resume home dose Lipitor.  5. DM type II.  In poor outpatient control due to hyperglycemia with high A1c, placed on Lantus along with sliding scale, monitor closely as he is on steroids.  Lab Results  Component Value Date   HGBA1C 8.3 (A) 12/08/2018    CBG (last 3)  Recent Labs    12/13/18 2051 12/14/18 0800  GLUCAP 177* 146*       Condition - Extremely Guarded  Family Communication  :  Daughter on 12/14/18  Code Status :  Full  Diet :  Diet Order            Diet heart healthy/carb modified Room service appropriate? Yes; Fluid consistency: Thin  Diet effective now               Disposition Plan  : TBD  Consults  :  None  Procedures  :    CT head-C Spine - X Ray T&L Spine - Non acute  PUD Prophylaxis :   DVT Prophylaxis  :   Heparin    Lab Results  Component Value Date   PLT 195 12/13/2018    Inpatient Medications  Scheduled Meds:  amLODipine  10 mg Oral Daily   aspirin  162 mg Oral Once   heparin  5,000 Units Subcutaneous Q8H   insulin aspart  0-5 Units Subcutaneous QHS   insulin aspart  0-9 Units Subcutaneous TID WC   insulin glargine  15 Units Subcutaneous Daily   methylPREDNISolone (SOLU-MEDROL) injection  60 mg Intravenous Q12H   Continuous Infusions:  lactated ringers 75 mL/hr at 12/14/18 0644   PRN Meds:.acetaminophen, guaiFENesin, hydrALAZINE  Antibiotics  :    Anti-infectives (From admission, onward)   None       Time Spent in minutes  30   Lala Lund M.D on 12/14/2018 at 10:02 AM  To page go  to [email or web address] - password [facility]  [facility] -  Office  [phone]   See all Orders from today for further details   Objective:   Vitals:   12/14/18 0436 12/14/18 0454 12/14/18 0802 12/14/18 0920  BP: (!) 175/91 (!) 151/82 (!) 145/66   Pulse:  96 100   Resp:  (!) 32 (!) 30   Temp: (!) 102.9 F (39.4 C)  (!) 102.9 F (39.4 C) (!) 101.3 F (38.5 C)  TempSrc: Oral  Oral Oral  SpO2:  95% 94%   Weight:      Height:        Wt Readings from Last 3 Encounters:  12/14/18 108.4 kg  12/08/18 112 kg  08/22/18 115 kg     Intake/Output Summary (Last 24 hours) at 12/14/2018 1002 Last data filed at 12/14/2018 0930 Gross per 24 hour  Intake 195.24 ml  Output 250 ml  Net -54.76 ml     Physical Exam  Awake Alert,  No new F.N deficits,   [place].AT,PERRAL Supple Neck,No JVD, No cervical lymphadenopathy appriciated.  Symmetrical Chest wall movement, Good air movement bilaterally, CTAB RRR,No Gallops,Rubs or new Murmurs, No Parasternal Heave +ve B.Sounds, Abd Soft, No tenderness, No organomegaly appriciated, No rebound - guarding or rigidity. No Cyanosis, Clubbing or edema, No  new Rash or bruise       Data Review:    CBC Recent Labs  Lab 12/13/18 2155  WBC 9.4  HGB 11.5*  HCT 36.2*  PLT 195  MCV 87.9  MCH 27.9  MCHC 31.8  RDW 14.8    Chemistries  Recent Labs  Lab 12/13/18 2155 12/14/18 0654  NA 136 136  K 4.3 4.1  CL 102 101  CO2 23 26  GLUCOSE 165* 138*  BUN 28* 24*  CREATININE 1.58* 1.22  CALCIUM 9.3 8.8*  MG 1.8 1.8  AST  --  36  ALT  --  25  ALKPHOS  --  39  BILITOT  --  0.8   ------------------------------------------------------------------------------------------------------------------ No results for input(s): CHOL, HDL, LDLCALC, TRIG, CHOLHDL, LDLDIRECT in the last 72 hours.  Lab Results  Component Value Date   HGBA1C 8.3 (A) 12/08/2018    ------------------------------------------------------------------------------------------------------------------ No results for input(s): TSH, T4TOTAL, T3FREE, THYROIDAB in the last 72 hours.  Invalid input(s): FREET3  Cardiac Enzymes No results for input(s): CKMB, TROPONINI, MYOGLOBIN in the last 168 hours.  Invalid input(s): CK ------------------------------------------------------------------------------------------------------------------    Component Value Date/Time   BNP 12.1 12/14/2018 0654    Micro Results Recent Results (from the past 240 hour(s))  SARS Coronavirus 2     Status: Abnormal   Collection Time: 12/14/18 12:38 AM  Result Value Ref Range Status   SARS Coronavirus 2 DETECTED (A) NOT DETECTED Final    Comment: RESULT CALLED TO, READ BACK BY AND VERIFIED WITH: H. BILLITER,RN 0251 12/14/2018 T. TYSOR (NOTE) SARS-CoV-2 target nucleic acids are DETECTED. The SARS-CoV-2 RNA is generally detectable in upper and lower respiratory specimens during the acute phase of infection. Positive results are indicative of active infection with SARS-CoV-2. Clinical  correlation with patient history and other diagnostic information is necessary to determine patient infection status. Positive results do  not rule out bacterial infection or co-infection with other viruses. The expected result is Not Detected. Fact Sheet for Patients: http://www.biofiredefense.com/wp-content/uploads/2020/03/BIOFIRE-COVID -19-patients.pdf Fact Sheet for Healthcare Providers: http://www.biofiredefense.com/wp-content/uploads/2020/03/BIOFIRE-COVID -19-hcp.pdf This test is not yet approved or cleared by the [place] and  has been authorized for detection and/or diagnosis of SARS-CoV-2 by FDA under an Patent examiner y Insurance account manager (EUA).  This EUA will remain in effect (meaning this test can be used) for the duration of  the COVID-19 declaration under Section 564(b)(1) of the Act,  21 U.S.C. section [phone] 3(b)(1), unless the authorization is terminated or revoked sooner. Performed at [facility], [address], [place], [place] [address]     Radiology Reports Dg Chest 1 View  Result Date: 12/13/2018 CLINICAL DATA:  Initial evaluation for acute trauma, fall. EXAM: CHEST  1 VIEW COMPARISON:  Prior radiograph from 12/24/2003. FINDINGS: Cardiomegaly. Mediastinal silhouette normal. Lungs mildly hypoinflated. No focal infiltrates. No pulmonary edema or pleural effusion. No pneumothorax. No acute osseous finding. IMPRESSION: 1. No active cardiopulmonary disease. 2. Cardiomegaly without pulmonary edema. Electronically Signed   By: Jeannine Boga M.D.   On: 12/13/2018 21:36   Dg Thoracic Spine 2 View  Result Date: 12/13/2018 CLINICAL DATA:  Initial evaluation for acute trauma, fall. EXAM: THORACIC SPINE 2 VIEWS COMPARISON:  None. FINDINGS: Mild dextroscoliosis. Alignment otherwise normal with preservation of the normal thoracic kyphosis. No listhesis or malalignment. Vertebral body heights maintained without evidence for acute or chronic fracture. Moderate multilevel degenerative endplate spurring seen throughout the mid and lower thoracic spine. Visualized soft tissues demonstrate no acute finding. IMPRESSION: No radiographic evidence  for acute traumatic injury within the thoracic spine. Electronically Signed   By: Jeannine Boga M.D.   On: 12/13/2018 21:38   Dg Lumbar Spine Complete  Result Date: 12/13/2018 CLINICAL DATA:  Initial evaluation for acute trauma, fall. EXAM: LUMBAR SPINE - COMPLETE 4+ VIEW COMPARISON:  Prior MRI from 10/14/2011. FINDINGS: Five non rib-bearing lumbar type vertebral bodies. Vertebral bodies normally aligned with preservation of the normal lumbar lordosis. Vertebral body heights maintained. Visualized sacrum and pelvis intact. No acute fracture or malalignment. SI joints approximated symmetric. Moderate degenerative endplate spurring  seen throughout the lumbar spine, most notable at L4-5 on the left. Visualized soft tissues demonstrate no acute finding. Degenerative changes about the hips bilaterally. IMPRESSION: No radiographic evidence for acute traumatic injury within the lumbar spine. Electronically Signed   By: Jeannine Boga M.D.   On: 12/13/2018 21:41   Ct Head Wo Contrast  Result Date: 12/13/2018 CLINICAL DATA:  Pain status post fall. EXAM: CT HEAD WITHOUT CONTRAST CT CERVICAL SPINE WITHOUT CONTRAST TECHNIQUE: Multidetector CT imaging of the head and cervical spine was performed following the standard protocol without intravenous contrast. Multiplanar CT image reconstructions of the cervical spine were also generated. COMPARISON:  None. FINDINGS: CT HEAD FINDINGS Brain: No evidence of acute infarction, hemorrhage, hydrocephalus, extra-axial collection or mass lesion/mass effect. Vascular: No hyperdense vessel or unexpected calcification. Skull: Normal. Negative for fracture or focal lesion. Mild posterior scalp swelling is noted. Sinuses/Orbits: There is some mild mucosal thickening of the left sphenoid sinus, otherwise the remaining paranasal sinuses and mastoid air cells are essentially clear. Other: None. CT CERVICAL SPINE FINDINGS Alignment: Normal. Skull base and vertebrae: No acute fracture. No primary bone lesion or focal pathologic process. Soft tissues and spinal canal: No prevertebral fluid or swelling. No visible canal hematoma. Disc levels:  There is minimal multilevel disc height loss. Upper chest: Negative. Other: None IMPRESSION: 1. No acute intracranial abnormality. 2. No acute cervical spine fracture. 3. Mild posterior scalp swelling without evidence of an underlying fracture. Electronically Signed   By: Constance Holster M.D.   On: 12/13/2018 22:41   Ct Cervical Spine Wo Contrast  Result Date: 12/13/2018 CLINICAL DATA:  Pain status post fall. EXAM: CT HEAD WITHOUT CONTRAST CT CERVICAL SPINE WITHOUT CONTRAST  TECHNIQUE: Multidetector CT imaging of the head and cervical spine was performed following the standard protocol without intravenous contrast. Multiplanar CT image reconstructions of the cervical spine were also generated. COMPARISON:  None. FINDINGS: CT HEAD FINDINGS Brain: No evidence of acute infarction, hemorrhage, hydrocephalus, extra-axial collection or mass lesion/mass effect. Vascular: No hyperdense vessel or unexpected calcification. Skull: Normal. Negative for fracture or focal lesion. Mild posterior scalp swelling is noted. Sinuses/Orbits: There is some mild mucosal thickening of the left sphenoid sinus, otherwise the remaining paranasal sinuses and mastoid air cells are essentially clear. Other: None. CT CERVICAL SPINE FINDINGS Alignment: Normal. Skull base and vertebrae: No acute fracture. No primary bone lesion or focal pathologic process. Soft tissues and spinal canal: No prevertebral fluid or swelling. No visible canal hematoma. Disc levels:  There is minimal multilevel disc height loss. Upper chest: Negative. Other: None IMPRESSION: 1. No acute intracranial abnormality. 2. No acute cervical spine fracture. 3. Mild posterior scalp swelling without evidence of an underlying fracture. Electronically Signed   By: Constance Holster M.D.   On: 12/13/2018 22:41   Dg Chest Port 1 View  Result Date: 12/14/2018 CLINICAL DATA:  Shortness of breath. EXAM: PORTABLE CHEST 1 VIEW COMPARISON:  One-view chest x-ray  12/13/2018 FINDINGS: The heart size is exaggerated by low lung volumes. Chronic interstitial opacities are again seen. No significant airspace consolidation is present. There is no edema or effusion. Mild degenerative changes are noted at the left shoulder. IMPRESSION: 1. Low lung volumes. 2. No acute cardiopulmonary disease. Electronically Signed   By: San Morelle M.D.   On: 12/14/2018 06:36

## 2018-12-15 LAB — COMPREHENSIVE METABOLIC PANEL
ALT: 23 U/L (ref 0–44)
AST: 35 U/L (ref 15–41)
Albumin: 2.8 g/dL — ABNORMAL LOW (ref 3.5–5.0)
Alkaline Phosphatase: 39 U/L (ref 38–126)
Anion gap: 11 (ref 5–15)
BUN: 24 mg/dL — ABNORMAL HIGH (ref 8–23)
CO2: 22 mmol/L (ref 22–32)
Calcium: 8.7 mg/dL — ABNORMAL LOW (ref 8.9–10.3)
Chloride: 104 mmol/L (ref 98–111)
Creatinine, Ser: 1.01 mg/dL (ref 0.61–1.24)
GFR calc Af Amer: >60 mL/min (ref >60)
GFR calc non Af Amer: >60 mL/min (ref >60)
Glucose, Bld: 245 mg/dL — ABNORMAL HIGH (ref 70–99)
Potassium: 4.6 mmol/L (ref 3.5–5.1)
Sodium: 137 mmol/L (ref 135–145)
Total Bilirubin: 0.5 mg/dL (ref 0.3–1.2)
Total Protein: 6.6 g/dL (ref 6.5–8.1)

## 2018-12-15 LAB — CBC WITH DIFFERENTIAL/PLATELET
Abs Immature Granulocytes: 0.05 10*3/uL (ref 0.00–0.07)
Basophils Absolute: 0 10*3/uL (ref 0.0–0.1)
Basophils Relative: 0 %
Eosinophils Absolute: 0 10*3/uL (ref 0.0–0.5)
Eosinophils Relative: 0 %
HCT: 34.6 % — ABNORMAL LOW (ref 39.0–52.0)
Hemoglobin: 11.2 g/dL — ABNORMAL LOW (ref 13.0–17.0)
Immature Granulocytes: 1 %
Lymphocytes Relative: 7 %
Lymphs Abs: 0.7 10*3/uL (ref 0.7–4.0)
MCH: 28.6 pg (ref 26.0–34.0)
MCHC: 32.4 g/dL (ref 30.0–36.0)
MCV: 88.5 fL (ref 80.0–100.0)
Monocytes Absolute: 0.2 10*3/uL (ref 0.1–1.0)
Monocytes Relative: 2 %
Neutro Abs: 8.4 10*3/uL — ABNORMAL HIGH (ref 1.7–7.7)
Neutrophils Relative %: 90 %
Platelets: 200 10*3/uL (ref 150–400)
RBC: 3.91 MIL/uL — ABNORMAL LOW (ref 4.22–5.81)
RDW: 15 % (ref 11.5–15.5)
WBC: 9.3 10*3/uL (ref 4.0–10.5)
nRBC: 0 % (ref 0.0–0.2)

## 2018-12-15 LAB — URINE CULTURE: Culture: NO GROWTH

## 2018-12-15 LAB — C-REACTIVE PROTEIN: CRP: 8.3 mg/dL — ABNORMAL HIGH (ref <1.0)

## 2018-12-15 LAB — BRAIN NATRIURETIC PEPTIDE: B Natriuretic Peptide: 15.6 pg/mL (ref 0.0–100.0)

## 2018-12-15 LAB — D-DIMER, QUANTITATIVE: D-Dimer, Quant: 1.6 ug/mL-FEU — ABNORMAL HIGH (ref 0.00–0.50)

## 2018-12-15 LAB — MAGNESIUM: Magnesium: 2 mg/dL (ref 1.7–2.4)

## 2018-12-15 LAB — GLUCOSE, CAPILLARY
Glucose-Capillary: 218 mg/dL — ABNORMAL HIGH (ref 70–99)
Glucose-Capillary: 220 mg/dL — ABNORMAL HIGH (ref 70–99)
Glucose-Capillary: 260 mg/dL — ABNORMAL HIGH (ref 70–99)
Glucose-Capillary: 211 mg/dL — ABNORMAL HIGH (ref 70–99)

## 2018-12-15 LAB — LACTATE DEHYDROGENASE: LDH: 305 U/L — ABNORMAL HIGH (ref 98–192)

## 2018-12-15 LAB — FERRITIN: Ferritin: 389 ng/mL — ABNORMAL HIGH (ref 24–336)

## 2018-12-15 LAB — PROCALCITONIN: Procalcitonin: 0.11 ng/mL

## 2018-12-15 MED ORDER — CARVEDILOL 3.125 MG PO TABS
3.1250 mg | ORAL_TABLET | Freq: Two times a day (BID) | ORAL | Status: DC
  Administered 2018-12-15 – 2018-12-16 (×2): 3.125 mg via ORAL
  Filled 2018-12-15 (×2): qty 1

## 2018-12-15 MED ORDER — INSULIN GLARGINE 100 UNIT/ML ~~LOC~~ SOLN
20.0000 [IU] | Freq: Every day | SUBCUTANEOUS | Status: DC
  Administered 2018-12-16: 20 [IU] via SUBCUTANEOUS
  Filled 2018-12-15: qty 0.2

## 2018-12-15 MED ORDER — LACTATED RINGERS IV SOLN
INTRAVENOUS | Status: DC
  Administered 2018-12-15 (×2): via INTRAVENOUS

## 2018-12-15 NOTE — Evaluation (Signed)
Occupational Therapy Evaluation Patient Details Name: Eric Holloway MRN: [id number] DOB: 04-10-1951 Today's Date: 12/15/2018    History of Present Illness 68 y.o. male admitted on 12/13/18 for syncopal episode on his steps at home with known COVID (+).  fever (+), increased RR, but no O2 needs at time of evaluation.  Pt with significant PMH of prostate CA s/p resection, DM.     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 Pt is currently min guard assist for transfers with RW. ABle to complete LB dressing min guard at EOB for socks, and able to complete standing grooming tasks at min guard at sink. Pt provided with HEP and theraband (orange), as well as handout for energy conservation. Pt will benefit from skilled OT in the acute setting as well as afterwards at the [facility] level to focus not only on ADL but IADL. Next session review HEP (have patient perform) and energy conservation as well as functional mobility/transfers activity tolerance. VSS throughout session, see general comments below.     Follow Up Recommendations  Home health OT    Equipment Recommendations  3 in 1 bedside commode    Recommendations for Other Services       Precautions / Restrictions Precautions Precautions: Fall Precaution Comments: recent fall, thought to be syncopal.  Restrictions Weight Bearing Restrictions: No      Mobility Bed Mobility Overal bed mobility: Modified Independent             General bed mobility comments: increased time and use of bed rail, no physical assist needed  Transfers Overall transfer level: Needs assistance Equipment used: Rolling walker (2 wheeled) Transfers: Sit to/from Stand Sit to Stand: Min assist         General transfer comment: Min assist to power up, cues for safe hand placement during transitions.     Balance Overall balance assessment: Needs  assistance Sitting-balance support: Feet supported;No upper extremity supported Sitting balance-Leahy Scale: Good     Standing balance support: Bilateral upper extremity supported Standing balance-Leahy Scale: Poor Standing balance comment: needs at least one UE support in standing for balance and due to weakness.                            ADL either performed or assessed with clinical judgement   ADL Overall ADL's : Needs assistance/impaired Eating/Feeding: Modified independent;Sitting   Grooming: Supervision/safety;Standing;Wash/dry hands;Wash/dry face   Upper Body Bathing: Supervision/ safety;Sitting   Lower Body Bathing: Min guard   Upper Body Dressing : Supervision/safety   Lower Body Dressing: Min guard   Toilet Transfer: Min guard;Ambulation;RW Toilet Transfer Details (indicate cue type and reason): cues for safe hand placement Toileting- Clothing Manipulation and Hygiene: Min guard;Sit to/from stand       Functional mobility during ADLs: Min guard;Rolling walker       Vision Patient Visual Report: No change from baseline       Perception     Praxis      Pertinent Vitals/Pain Pain Assessment: No/denies pain Pain Intervention(s): Monitored during session     Hand Dominance Right   Extremity/Trunk Assessment         Cervical / Trunk Assessment Cervical / Trunk Assessment: Other exceptions Cervical / Trunk Exceptions: bruised from fall   Communication Communication Communication: HOH;Other (comment)(vs loud fan in his room.)   Cognition Arousal/Alertness: Awake/alert Behavior During  Therapy: WFL for tasks assessed/performed Overall Cognitive Status: No family/caregiver present to determine baseline cognitive functioning                               Problem Solving: Slow processing General Comments: suspect that Pt is at baseline, laughing appropriately at jokes and making them,    General Comments  HR 63-87, max RR  35, O2 saturations 87-100 on RA; PT provided handout on energy conservation and HEP with theraband    Exercises Exercises: Other exercises Other Exercises Other Exercises: Pt provided with HEP and theraband, verbally edcuated and demonstrated by OT - but Pt did not perform today during therapy session   Shoulder Instructions      Home Living Family/patient expects to be discharged to:: Private residence Living Arrangements: Spouse/significant other Available Help at Discharge: Family;Available 24 hours/day(if needed 24/7, but wife does work. ) Type of Home: House Home Access: Stairs to enter Technical brewer of Steps: 7 Entrance Stairs-Rails: Right [place]: One level     Bathroom Shower/Tub: Teacher, early years/pre: [place]: None          Prior Functioning/Environment Level of Independence: Independent        Comments: works at [employer] in [employer].         OT Problem List: Decreased activity tolerance;Decreased strength;Impaired balance (sitting and/or standing);Decreased safety awareness;Decreased knowledge of use of DME or AE;Decreased knowledge of precautions;Cardiopulmonary status limiting activity      OT Treatment/Interventions: Self-care/ADL training;Therapeutic exercise;Energy conservation;DME and/or AE instruction;Therapeutic activities;Patient/family education;Balance training    OT Goals(Current goals can be found in the care plan section) Acute Rehab OT Goals Patient Stated Goal: to get home feel better, get back to work OT Goal Formulation: With patient Time For Goal Achievement: 12/29/18 Potential to Achieve Goals: Good ADL Goals Pt Will Perform Grooming: with modified independence;standing Pt Will Perform Upper Body Dressing: with modified independence;sitting Pt Will Perform Lower Body Dressing: with modified independence;sit to/from stand Pt Will Transfer to Toilet: with modified  independence;ambulating Pt Will Perform Toileting - Clothing Manipulation and hygiene: with modified independence;sit to/from stand Pt/caregiver will Perform Home Exercise Program: Both right and left upper extremity;With theraband;With written HEP provided;Independently Additional ADL Goal #1: Pt will recall 3 ways of conserving energy during daily ADL routine at independent level  OT Frequency: Min 2X/week   Barriers to D/C:            Co-evaluation              AM-PAC OT "6 Clicks" Daily Activity     Outcome Measure Help from another person eating meals?: None Help from another person taking care of personal grooming?: A Little Help from another person toileting, which includes using toliet, bedpan, or urinal?: A Little Help from another person bathing (including washing, rinsing, drying)?: A Little Help from another person to put on and taking off regular upper body clothing?: A Little Help from another person to put on and taking off regular lower body clothing?: A Little 6 Click Score: 19   End of Session Equipment Utilized During Treatment: Rolling walker Nurse Communication: Mobility status  Activity Tolerance: Patient tolerated treatment well Patient left: in chair;with call bell/phone within reach  OT Visit Diagnosis: Unsteadiness on feet (R26.81);Muscle weakness (generalized) (M62.81)                Time: 0569-7948 OT  Time Calculation (min): 51 min Charges:  OT General Charges $OT Visit: 1 Visit OT Evaluation $OT Eval Moderate Complexity: 1 Mod OT Treatments $Self Care/Home Management : 8-22 mins $Therapeutic Activity: 8-22 mins  Hulda Humphrey OTR/L Acute Rehabilitation Services Pager: [phone] Office: [phone]  Merri Ray Treg Diemer 12/15/2018, 5:36 PM

## 2018-12-15 NOTE — Progress Notes (Signed)
[facility] Department and AHF Team  ReDS Research Project   Patient #: [id number]  ReDS Measurement  Right:  30%  Left: Low quality x 3

## 2018-12-15 NOTE — Progress Notes (Signed)
PROGRESS NOTE                                                                                                                                                                                                             Patient Demographics:    Eric Holloway, is a 68 y.o. male, DOB - 1951-06-19, ZOX:[id number]  Outpatient Primary MD for the patient is Benay Pike, MD    LOS - 2  Admit date - 12/13/2018    Chief Complaint  Patient presents with   Fall       Brief Narrative  Patient is a 68 year old African-American male, with past medical history significant for prostate cancer, hypertension, hyperlipidemia, diabetes mellitus, cataract and arthritis.  Apparently, patient was diagnosed with COVID about 4 days ago.  Patient has been isolating himself at home.  Patient had a syncopal episode after exposing himself to excessive sunlight today.   Subjective:   Patient in bed, appears comfortable, denies any headache, no fever, no chest pain or pressure, no shortness of breath , no abdominal pain. No focal weakness.  But feels slightly lightheaded upon standing up.   Assessment  & Plan :      1. Dehydration, syncope and fall due to Acute Covid 19 Viral Illness during the ongoing 2020 Covid 19 Pandemic - he does not seem to be hypoxic and currently chest x-ray stable, head and neck CT along with T and L-spine x-ray are unremarkable as well.  Inflammatory markers are mildly elevated hence he will be started on IV steroids, he will be monitored closely, encouraged him to increase activity, PT eval, he is positive for orthostatic hypotension so will continue hydration with IV fluids.  DME equipment which is rolling walker has been ordered.  Continue supportive care discharge once symptoms improve.  COVID-19 Labs  Recent Labs    12/14/18 0654 12/15/18 0300 12/15/18 0330  DDIMER 2.41* 1.60*  --   FERRITIN 352*  --   --   LDH  258* 305*  --   CRP 7.5*  --  8.3*    Lab Results  Component Value Date   SARSCOV2NAA DETECTED (A) 12/14/2018     Hepatic Function Latest Ref Rng & Units 12/15/2018 12/14/2018 11/29/2018  Total Protein 6.5 - 8.1 g/dL 6.6 6.9 6.5  Albumin 3.5 - 5.0 g/dL 2.8(L) 3.1(L) -  AST 15 - 41 U/L 35 36 12  ALT 0 - 44 U/L 23 25 10   Alk Phosphatase 38 - 126 U/L 39 39 -  Total Bilirubin 0.3 - 1.2 mg/dL 0.5 0.8 0.5  Bilirubin, Direct 0.0 - 0.3 mg/dL - - -        Component Value Date/Time   BNP 15.6 12/15/2018 0300      2.  Hypertension.  Placed on Coreg along with as needed hydralazine.    3.  BPH - hold alpha-blocker till orthostatics improved.  4.  Dyslipidemia.  Resume home dose Lipitor.  5.  Longstanding orthostatic hypotension and orthostatic dizziness.  His orthostatic blood pressures suggestive of orthostatic hypotension, TED stockings, hydrate, avoid alpha blockers, ACE inhibitor or diuretic.  Monitor.    6. DM type II.  In poor outpatient control due to hyperglycemia with high A1c, placed on Lantus along with sliding scale, monitor closely as he is on steroids.  Increase Lantus dose for better control.  Lab Results  Component Value Date   HGBA1C 8.3 (A) 12/08/2018    CBG (last 3)  Recent Labs    12/14/18 1638 12/14/18 2023 12/15/18 0832  GLUCAP 229* 289* 218*       Condition - Extremely Guarded  Family Communication  :  Daughter on 12/14/18  Code Status :  Full  Diet :  Diet Order            Diet heart healthy/carb modified Room service appropriate? Yes; Fluid consistency: Thin  Diet effective now               Disposition Plan  : TBD  Consults  :  None  Procedures  :    CT head-C Spine - X Ray T&L Spine - Non acute  PUD Prophylaxis :   DVT Prophylaxis  :   Heparin    Lab Results  Component Value Date   PLT 200 12/15/2018    Inpatient Medications  Scheduled Meds:  aspirin EC  81 mg Oral Daily   atorvastatin  40 mg Oral Daily    carvedilol  3.125 mg Oral BID WC   folic acid  2 mg Oral Daily   heparin  5,000 Units Subcutaneous Q8H   insulin aspart  0-5 Units Subcutaneous QHS   insulin aspart  0-9 Units Subcutaneous TID WC   [START ON 12/16/2018] insulin glargine  20 Units Subcutaneous Daily   methylPREDNISolone (SOLU-MEDROL) injection  60 mg Intravenous Q12H   pantoprazole  40 mg Oral Daily   Continuous Infusions:  lactated ringers     PRN Meds:.acetaminophen, guaiFENesin, hydrALAZINE  Antibiotics  :    Anti-infectives (From admission, onward)   None       Time Spent in minutes  30   Lala Lund M.D on 12/15/2018 at 9:57 AM  To page go to [email or web address] - password [facility]  [facility] -  Office  [phone]   See all Orders from today for further details   Objective:   Vitals:   12/15/18 0835 12/15/18 0847 12/15/18 0910 12/15/18 0914  BP:  (!) 145/75 114/87 135/75  Pulse:   93 97  Resp:      Temp: 97.8 F (36.6 C)     TempSrc: Oral     SpO2:      Weight:      Height:        Wt Readings from Last 3 Encounters:  12/14/18 108.4 kg  12/08/18 112 kg  08/22/18 115 kg  Intake/Output Summary (Last 24 hours) at 12/15/2018 0957 Last data filed at 12/15/2018 0913 Gross per 24 hour  Intake 2312.62 ml  Output 850 ml  Net 1462.62 ml     Physical Exam  Awake Alert, Oriented X 3, No new F.N deficits, Normal affect [place].AT,PERRAL Supple Neck,No JVD, No cervical lymphadenopathy appriciated.  Symmetrical Chest wall movement, Good air movement bilaterally, CTAB RRR,No Gallops, Rubs or new Murmurs, No Parasternal Heave +ve B.Sounds, Abd Soft, No tenderness, No organomegaly appriciated, No rebound - guarding or rigidity. No Cyanosis, Clubbing or edema, No new Rash or bruise    Data Review:    CBC Recent Labs  Lab 12/13/18 2155 12/14/18 0654 12/15/18 0300  WBC 9.4 9.5 9.3  HGB 11.5* 11.9* 11.2*  HCT 36.2* 36.7* 34.6*  PLT 195 196 200  MCV 87.9 88.9 88.5  MCH 27.9  28.8 28.6  MCHC 31.8 32.4 32.4  RDW 14.8 15.1 15.0  LYMPHSABS  --   --  0.7  MONOABS  --   --  0.2  EOSABS  --   --  0.0  BASOSABS  --   --  0.0    Chemistries  Recent Labs  Lab 12/13/18 2155 12/14/18 0654 12/15/18 0300  NA 136 136 137  K 4.3 4.1 4.6  CL 102 101 104  CO2 23 26 22   GLUCOSE 165* 138* 245*  BUN 28* 24* 24*  CREATININE 1.58* 1.22 1.01  CALCIUM 9.3 8.8* 8.7*  MG 1.8 1.8 2.0  AST  --  36 35  ALT  --  25 23  ALKPHOS  --  39 39  BILITOT  --  0.8 0.5   ------------------------------------------------------------------------------------------------------------------ No results for input(s): CHOL, HDL, LDLCALC, TRIG, CHOLHDL, LDLDIRECT in the last 72 hours.  Lab Results  Component Value Date   HGBA1C 8.3 (A) 12/08/2018   ------------------------------------------------------------------------------------------------------------------ No results for input(s): TSH, T4TOTAL, T3FREE, THYROIDAB in the last 72 hours.  Invalid input(s): FREET3  Cardiac Enzymes No results for input(s): CKMB, TROPONINI, MYOGLOBIN in the last 168 hours.  Invalid input(s): CK ------------------------------------------------------------------------------------------------------------------    Component Value Date/Time   BNP 15.6 12/15/2018 0300    Micro Results Recent Results (from the past 240 hour(s))  Blood culture (routine x 2)     Status: None (Preliminary result)   Collection Time: 12/13/18  9:50 PM   Specimen: BLOOD  Result Value Ref Range Status   Specimen Description BLOOD LEFT ARM  Final   Special Requests   Final    BOTTLES DRAWN AEROBIC AND ANAEROBIC Blood Culture results may not be optimal due to an inadequate volume of blood received in culture bottles   Culture   Final    NO GROWTH < 24 HOURS Performed at [facility], [place] [address], [place], [place] [address]    Report Status PENDING  Incomplete  Blood culture (routine x 2)     Status: None  (Preliminary result)   Collection Time: 12/13/18  9:56 PM   Specimen: BLOOD  Result Value Ref Range Status   Specimen Description BLOOD LEFT FOREARM  Final   Special Requests   Final    BOTTLES DRAWN AEROBIC AND ANAEROBIC Blood Culture adequate volume   Culture   Final    NO GROWTH < 24 HOURS Performed at [facility], [place] [address], [place],  [address]    Report Status PENDING  Incomplete  SARS Coronavirus 2     Status: Abnormal   Collection Time: 12/14/18 12:38  AM  Result Value Ref Range Status   SARS Coronavirus 2 DETECTED (A) NOT DETECTED Final    Comment: RESULT CALLED TO, READ BACK BY AND VERIFIED WITH: H. BILLITER,RN 0251 12/14/2018 T. TYSOR (NOTE) SARS-CoV-2 target nucleic acids are DETECTED. The SARS-CoV-2 RNA is generally detectable in upper and lower respiratory specimens during the acute phase of infection. Positive results are indicative of active infection with SARS-CoV-2. Clinical  correlation with patient history and other diagnostic information is necessary to determine patient infection status. Positive results do  not rule out bacterial infection or co-infection with other viruses. The expected result is Not Detected. Fact Sheet for Patients: http://www.biofiredefense.com/wp-content/uploads/2020/03/BIOFIRE-COVID -19-patients.pdf Fact Sheet for Healthcare Providers: http://www.biofiredefense.com/wp-content/uploads/2020/03/BIOFIRE-COVID -19-hcp.pdf This test is not yet approved or cleared by the [place] and  has been authorized for detection and/or diagnosis of SARS-CoV-2 by FDA under an Patent examiner y Insurance account manager (EUA).  This EUA will remain in effect (meaning this test can be used) for the duration of  the COVID-19 declaration under Section 564(b)(1) of the Act, 21 U.S.C. section [phone] 3(b)(1), unless the authorization is terminated or revoked sooner. Performed at [facility], [place] [address] [place], [place] [address]     Culture, Urine     Status: None   Collection Time: 12/14/18  9:43 AM   Specimen: Urine, Clean Catch  Result Value Ref Range Status   Specimen Description   Final    URINE, CLEAN CATCH Performed at [facility], [place] [address] [place], [place] [address]    Special Requests   Final    NONE Performed at [facility], [place] [address], [place], [place] [address]    Culture   Final    NO GROWTH Performed at [facility], [place] [address], [place],  [address]    Report Status 12/15/2018 FINAL  Final    Radiology Reports Dg Chest 1 View  Result Date: 12/13/2018 CLINICAL DATA:  Initial evaluation for acute trauma, fall. EXAM: CHEST  1 VIEW COMPARISON:  Prior radiograph from 12/24/2003. FINDINGS: Cardiomegaly. Mediastinal silhouette normal. Lungs mildly hypoinflated. No focal infiltrates. No pulmonary edema or pleural effusion. No pneumothorax. No acute osseous finding. IMPRESSION: 1. No active cardiopulmonary disease. 2. Cardiomegaly without pulmonary edema. Electronically Signed   By: Jeannine Boga M.D.   On: 12/13/2018 21:36   Dg Thoracic Spine 2 View  Result Date: 12/13/2018 CLINICAL DATA:  Initial evaluation for acute trauma, fall. EXAM: THORACIC SPINE 2 VIEWS COMPARISON:  None. FINDINGS: Mild dextroscoliosis. Alignment otherwise normal with preservation of the normal thoracic kyphosis. No listhesis or malalignment. Vertebral body heights maintained without evidence for acute or chronic fracture. Moderate multilevel degenerative endplate spurring seen throughout the mid and lower thoracic spine. Visualized soft tissues demonstrate no acute finding. IMPRESSION: No radiographic evidence for acute traumatic injury within the thoracic spine. Electronically Signed   By: Jeannine Boga M.D.   On: 12/13/2018 21:38   Dg Lumbar Spine Complete  Result Date: 12/13/2018 CLINICAL DATA:  Initial evaluation for acute trauma, fall. EXAM: LUMBAR  SPINE - COMPLETE 4+ VIEW COMPARISON:  Prior MRI from 10/14/2011. FINDINGS: Five non rib-bearing lumbar type vertebral bodies. Vertebral bodies normally aligned with preservation of the normal lumbar lordosis. Vertebral body heights maintained. Visualized sacrum and pelvis intact. No acute fracture or malalignment. SI joints approximated symmetric. Moderate degenerative endplate spurring seen throughout the lumbar spine, most notable at L4-5 on the left. Visualized soft tissues demonstrate no acute finding. Degenerative  changes about the hips bilaterally. IMPRESSION: No radiographic evidence for acute traumatic injury within the lumbar spine. Electronically Signed   By: Jeannine Boga M.D.   On: 12/13/2018 21:41   Ct Head Wo Contrast  Result Date: 12/13/2018 CLINICAL DATA:  Pain status post fall. EXAM: CT HEAD WITHOUT CONTRAST CT CERVICAL SPINE WITHOUT CONTRAST TECHNIQUE: Multidetector CT imaging of the head and cervical spine was performed following the standard protocol without intravenous contrast. Multiplanar CT image reconstructions of the cervical spine were also generated. COMPARISON:  None. FINDINGS: CT HEAD FINDINGS Brain: No evidence of acute infarction, hemorrhage, hydrocephalus, extra-axial collection or mass lesion/mass effect. Vascular: No hyperdense vessel or unexpected calcification. Skull: Normal. Negative for fracture or focal lesion. Mild posterior scalp swelling is noted. Sinuses/Orbits: There is some mild mucosal thickening of the left sphenoid sinus, otherwise the remaining paranasal sinuses and mastoid air cells are essentially clear. Other: None. CT CERVICAL SPINE FINDINGS Alignment: Normal. Skull base and vertebrae: No acute fracture. No primary bone lesion or focal pathologic process. Soft tissues and spinal canal: No prevertebral fluid or swelling. No visible canal hematoma. Disc levels:  There is minimal multilevel disc height loss. Upper chest: Negative. Other: None IMPRESSION:  1. No acute intracranial abnormality. 2. No acute cervical spine fracture. 3. Mild posterior scalp swelling without evidence of an underlying fracture. Electronically Signed   By: Constance Holster M.D.   On: 12/13/2018 22:41   Ct Cervical Spine Wo Contrast  Result Date: 12/13/2018 CLINICAL DATA:  Pain status post fall. EXAM: CT HEAD WITHOUT CONTRAST CT CERVICAL SPINE WITHOUT CONTRAST TECHNIQUE: Multidetector CT imaging of the head and cervical spine was performed following the standard protocol without intravenous contrast. Multiplanar CT image reconstructions of the cervical spine were also generated. COMPARISON:  None. FINDINGS: CT HEAD FINDINGS Brain: No evidence of acute infarction, hemorrhage, hydrocephalus, extra-axial collection or mass lesion/mass effect. Vascular: No hyperdense vessel or unexpected calcification. Skull: Normal. Negative for fracture or focal lesion. Mild posterior scalp swelling is noted. Sinuses/Orbits: There is some mild mucosal thickening of the left sphenoid sinus, otherwise the remaining paranasal sinuses and mastoid air cells are essentially clear. Other: None. CT CERVICAL SPINE FINDINGS Alignment: Normal. Skull base and vertebrae: No acute fracture. No primary bone lesion or focal pathologic process. Soft tissues and spinal canal: No prevertebral fluid or swelling. No visible canal hematoma. Disc levels:  There is minimal multilevel disc height loss. Upper chest: Negative. Other: None IMPRESSION: 1. No acute intracranial abnormality. 2. No acute cervical spine fracture. 3. Mild posterior scalp swelling without evidence of an underlying fracture. Electronically Signed   By: Constance Holster M.D.   On: 12/13/2018 22:41   Dg Chest Port 1 View  Result Date: 12/14/2018 CLINICAL DATA:  Shortness of breath. EXAM: PORTABLE CHEST 1 VIEW COMPARISON:  One-view chest x-ray 12/13/2018 FINDINGS: The heart size is exaggerated by low lung volumes. Chronic interstitial opacities are again  seen. No significant airspace consolidation is present. There is no edema or effusion. Mild degenerative changes are noted at the left shoulder. IMPRESSION: 1. Low lung volumes. 2. No acute cardiopulmonary disease. Electronically Signed   By: [place] M.D.   On: 12/14/2018 06:36

## 2018-12-16 LAB — PROCALCITONIN: Procalcitonin: <0.1 ng/mL

## 2018-12-16 LAB — COMPREHENSIVE METABOLIC PANEL
ALT: 26 U/L (ref 0–44)
AST: 37 U/L (ref 15–41)
Albumin: 2.5 g/dL — ABNORMAL LOW (ref 3.5–5.0)
Alkaline Phosphatase: 42 U/L (ref 38–126)
Anion gap: 11 (ref 5–15)
BUN: 22 mg/dL (ref 8–23)
CO2: 25 mmol/L (ref 22–32)
Calcium: 8.3 mg/dL — ABNORMAL LOW (ref 8.9–10.3)
Chloride: 102 mmol/L (ref 98–111)
Creatinine, Ser: 0.97 mg/dL (ref 0.61–1.24)
GFR calc Af Amer: >60 mL/min (ref >60)
GFR calc non Af Amer: >60 mL/min (ref >60)
Glucose, Bld: 232 mg/dL — ABNORMAL HIGH (ref 70–99)
Potassium: 4.9 mmol/L (ref 3.5–5.1)
Sodium: 138 mmol/L (ref 135–145)
Total Bilirubin: 0.3 mg/dL (ref 0.3–1.2)
Total Protein: 6.2 g/dL — ABNORMAL LOW (ref 6.5–8.1)

## 2018-12-16 LAB — CBC WITH DIFFERENTIAL/PLATELET
Abs Immature Granulocytes: 0.14 10*3/uL — ABNORMAL HIGH (ref 0.00–0.07)
Basophils Absolute: 0 10*3/uL (ref 0.0–0.1)
Basophils Relative: 0 %
Eosinophils Absolute: 0 10*3/uL (ref 0.0–0.5)
Eosinophils Relative: 0 %
HCT: 35.5 % — ABNORMAL LOW (ref 39.0–52.0)
Hemoglobin: 11 g/dL — ABNORMAL LOW (ref 13.0–17.0)
Immature Granulocytes: 1 %
Lymphocytes Relative: 5 %
Lymphs Abs: 0.7 10*3/uL (ref 0.7–4.0)
MCH: 27.4 pg (ref 26.0–34.0)
MCHC: 31 g/dL (ref 30.0–36.0)
MCV: 88.5 fL (ref 80.0–100.0)
Monocytes Absolute: 0.4 10*3/uL (ref 0.1–1.0)
Monocytes Relative: 3 %
Neutro Abs: 12.2 10*3/uL — ABNORMAL HIGH (ref 1.7–7.7)
Neutrophils Relative %: 91 %
Platelets: 236 10*3/uL (ref 150–400)
RBC: 4.01 MIL/uL — ABNORMAL LOW (ref 4.22–5.81)
RDW: 14.8 % (ref 11.5–15.5)
WBC: 13.4 10*3/uL — ABNORMAL HIGH (ref 4.0–10.5)
nRBC: 0 % (ref 0.0–0.2)

## 2018-12-16 LAB — FERRITIN: Ferritin: 447 ng/mL — ABNORMAL HIGH (ref 24–336)

## 2018-12-16 LAB — C-REACTIVE PROTEIN: CRP: 3.2 mg/dL — ABNORMAL HIGH (ref <1.0)

## 2018-12-16 LAB — GLUCOSE, CAPILLARY: Glucose-Capillary: 206 mg/dL — ABNORMAL HIGH (ref 70–99)

## 2018-12-16 LAB — MAGNESIUM: Magnesium: 1.8 mg/dL (ref 1.7–2.4)

## 2018-12-16 LAB — BRAIN NATRIURETIC PEPTIDE: B Natriuretic Peptide: 51.6 pg/mL (ref 0.0–100.0)

## 2018-12-16 LAB — INTERLEUKIN-6, PLASMA: Interleukin-6, Plasma: 10.9 pg/mL (ref 0.0–12.2)

## 2018-12-16 LAB — D-DIMER, QUANTITATIVE: D-Dimer, Quant: 1.18 ug/mL-FEU — ABNORMAL HIGH (ref 0.00–0.50)

## 2018-12-16 LAB — LACTATE DEHYDROGENASE: LDH: 273 U/L — ABNORMAL HIGH (ref 98–192)

## 2018-12-16 MED ORDER — CARVEDILOL 3.125 MG PO TABS: 3.1250 mg | ORAL_TABLET | Freq: Two times a day (BID) | ORAL | 0 refills | Status: DC

## 2018-12-16 NOTE — TOC Initial Note (Signed)
Transition of Care [facility]) - Initial/Assessment Note    Patient Details  Name: Eric Holloway MRN: [id number] Date of Birth: 01/12/1951  [facility] CM/SW Contact:    Purcell Mouton, RN Phone Number: 12/16/2018, 11:14 AM  Clinical Narrative:                   Expected Discharge Plan: Home/Self Care     Patient Goals and CMS Choice Patient states their goals for this hospitalization and ongoing recovery are:: To go home   Choice offered to / list presented to : Spouse  Expected Discharge Plan and Services Expected Discharge Plan: Home/Self Care   Discharge Planning Services: CM Consult Post Acute Care Choice: Durable Medical Equipment, Home Health Living arrangements for the past 2 months: Single Family Home Expected Discharge Date: 12/16/18               DME Arranged: [place] Rile DME Agency: [employer]     Representative spoke with at DME Agency: [facility] Arranged: Patient Refused [place]          Prior Living Arrangements/Services Living arrangements for the past 2 months: [place] Lives with:: Spouse Patient language and need for interpreter reviewed:: No                 Activities of Daily Living Home Assistive Devices/Equipment: None ADL Screening (condition at time of admission) Patient's cognitive ability adequate to safely complete daily activities?: Yes Is the patient deaf or have difficulty hearing?: Yes Does the patient have difficulty seeing, even when wearing glasses/contacts?: No Does the patient have difficulty concentrating, remembering, or making decisions?: Yes Patient able to express need for assistance with ADLs?: No Does the patient have difficulty dressing or bathing?: No Independently performs ADLs?: Yes (appropriate for developmental age) Does the patient have difficulty walking or climbing stairs?: No Weakness of Legs: None Weakness of Arms/Hands: None  Permission Sought/Granted                   Emotional Assessment Appearance:: Appears stated age     Orientation: : Oriented to Self, Oriented to Place      Admission diagnosis:  Cough [R05] Fever [R50.9] AKI (acute kidney injury) ([place]) [N17.9] Syncope, unspecified syncope type [R55] COVID-19 virus infection [U07.1] Syncope [R55] Patient Active Problem List   Diagnosis Date Noted  . Syncope 12/14/2018  . COVID-19 virus infection 12/14/2018  . Acute respiratory distress syndrome (ARDS) due to COVID-19 virus 12/13/2018  . Plantar fasciitis 08/22/2018  . Dyspnea 08/22/2018  . Nuclear sclerotic cataract of both eyes 12/15/2017  . Cortical age-related cataract of both eyes 12/15/2017  . Cyst of right kidney 08/11/2017  . DJD (degenerative joint disease), cervical 01/25/2017  . DDD (degenerative disc disease), lumbar 01/25/2017  . Tendinopathy of right shoulder 12/21/2016  . Dizziness 06/10/2016  . 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  . Erectile dysfunction following radiation therapy 04/17/2015  . Allergic rhinitis 08/09/2014  . Preventive measure 06/12/2013  . Right ankle pain 07/27/2011  . Screening for colorectal cancer 03/12/2011  . LOW BACK PAIN, CHRONIC 07/09/2009  . OBESITY 09/07/2008  . Hyperlipidemia 03/26/2008  . PARESTHESIA 04/20/2007  . T2DM (type 2 diabetes mellitus) ([place]) 09/08/2006  . Personal history of malignant neoplasm of prostate 09/08/2006  . ONYCHOMYCOSIS 09/02/2006  . Essential hypertension, benign 09/02/2006  . Rheumatoid arthritis ([place]) 09/02/2006  . PROTEINURIA 09/02/2006   PCP:  Benay Pike, MD Pharmacy:   [facility]  [place], [place]. [place] [place] [address] Phone: [phone] Fax: [place], [place] [facility] [address] [place] [address] [place] [address] Phone: [phone] Fax: [phone]     Social Determinants of Health ([place]) Interventions     Readmission Risk Interventions No flowsheet data found.

## 2018-12-16 NOTE — Progress Notes (Signed)
[facility] [employer] Department and AHF Team  ReDS Research Project   Patient #: [id number]  ReDS Measurement  Right: 21%  Left: 16%

## 2018-12-16 NOTE — Discharge Instructions (Signed)
Follow with Primary MD Benay Pike, MD in 7 days   Get CBC, CMP, 2 view Chest X ray -  checked next visit within 1 week by Primary MD    Activity: As tolerated with Full fall precautions use walker/cane & assistance as needed. Wear TED stocking in the daytime, off at night time.  You must sit at a stationary position for 5 minutes and do leg extension exercises as taught for 5 minutes before you start walking from a resting position or after getting up from a bed, once you stand up , stand at that spot for 3-5 minutes while holding on to a wall-bed-heavy furniture and then walk only if you are not dizzy, using a  walker at all times, if you still get dizzy sit down, and call for help.  Disposition Home    Diet: Heart Healthy  Low Carb     Special Instructions: If you have smoked or chewed Tobacco  in the last 2 yrs please stop smoking, stop any regular Alcohol  and or any Recreational drug use.  On your next visit with your primary care physician please Get Medicines reviewed and adjusted.  Please request your Prim.MD to go over all Hospital Tests and Procedure/Radiological results at the follow up, please get all Hospital records sent to your Prim MD by signing hospital release before you go home.  If you experience worsening of your admission symptoms, develop shortness of breath, life threatening emergency, suicidal or homicidal thoughts you must seek medical attention immediately by calling 911 or calling your MD immediately  if symptoms less severe.  You Must read complete instructions/literature along with all the possible adverse reactions/side effects for all the Medicines you take and that have been prescribed to you. Take any new Medicines after you have completely understood and accpet all the possible adverse reactions/side effects.       Person Under Monitoring Name: Eric Holloway  Location: [place] [address]   CORONAVIRUS DISEASE 2019  (COVID-19) Guidance for Persons Under Investigation You are being tested for the virus that causes coronavirus disease 2019 (COVID-19). Public health actions are necessary to ensure protection of your health and the health of others, and to prevent further spread of infection. COVID-19 is caused by a virus that can cause symptoms, such as fever, cough, and shortness of breath. The primary transmission from person to person is by coughing or sneezing. On August 04, 2018, the [place] announced a [employer] Emergency of International Concern and on August 05, 2018 the U.[place]. Department of Health and Human Services declared a public health emergency. If the virus that causesCOVID-19 spreads in the community, it could have severe public health consequences.  As a person under investigation for COVID-19, the [place] advises you to adhere to the following guidance until your test results are reported to you. If your test result is positive, you will receive additional information from your provider and your local health department at that time.   Remain at home until you are cleared by your health provider or public health authorities.   Keep a log of visitors to your home using the form provided. Any visitors to your home must be aware of your isolation status.  If you plan to move to a new address or leave the county, notify the local health department in your county.  Call a doctor or seek care  if you have an urgent medical need. Before seeking medical care, call ahead and get instructions from the provider before arriving at the medical office, clinic or hospital. Notify them that you are being tested for the virus that causes COVID-19 so arrangements can be made, as necessary, to prevent transmission to others in the healthcare setting. Next, notify the local health department in your county.  If a medical  emergency arises and you need to call 911, inform the first responders that you are being tested for the virus that causes COVID-19. Next, notify the local health department in your county.  Adhere to all guidance set forth by the [place] for [facility] of patients that is based on guidance from the Center for Disease Control and Prevention with suspected or confirmed COVID-19. It is provided with this guidance for Persons Under Investigation.  Your health and the health of our community are our top priorities. Public Health officials remain available to provide assistance and counseling to you about COVID-19 and compliance with this guidance.  Provider: ____________________________________________________________ Date: ______/_____/_________  By signing below, you acknowledge that you have read and agree to comply with this Guidance for Persons Under Investigation. ______________________________________________________________ Date: ______/_____/_________  WHO DO I CALL? You can find a list of local health departments here: [email or web address] Health Department: ____________________________________________________________________ Contact Name: ________________________________________________________________________ Telephone: ___________________________________________________________________________  Marice Potter, [place], Communicable Disease Branch COVID-19 Guidance for Persons Under Investigation September 10, 2018

## 2018-12-16 NOTE — Progress Notes (Signed)
Physical Therapy Treatment Patient Details Name: Eric Holloway MRN: [id number] DOB: September 11, 1950 Today's Date: 12/16/2018    History of Present Illness 68 y.o. male admitted on 12/13/18 for syncopal episode on his steps at home with known COVID (+).  fever (+), increased RR, but no O2 needs at time of evaluation.  Pt with significant PMH of prostate CA s/p resection, DM.      PT Comments    Pt was able to walk down the hallway with min guard assist at a very slow speed and with a cautious, guarded gait pattern.  He is agreeable to take the walker home with him, but will likely not use it.  [facility] therapy is recommended, but he politely declined.  PT to follow acutely until d/c confirmed.    Follow Up Recommendations  Supervision for mobility/OOB;Other (comment);Home health PT(politely declined home therapy)     Equipment Recommendations  Rolling walker with 5" wheels(agreeable to RW)    Recommendations for Other Services   NA     Precautions / Restrictions Precautions Precautions: Fall Precaution Comments: continues to be unsteady on his feet, recent fall at home.     Mobility  Bed Mobility Overal bed mobility: Modified Independent             General bed mobility comments: increased time and effort.   Transfers Overall transfer level: Needs assistance Equipment used: None Transfers: Sit to/from Stand Sit to Stand: Min guard         General transfer comment: Min guard assist for safety and balance.  Pt wanted to try without RW.    Ambulation/Gait Ambulation/Gait assistance: Min guard Gait Distance (Feet): 65 Feet Assistive device: None Gait Pattern/deviations: Step-through pattern;Narrow base of support;Decreased stride length Gait velocity: decreased Gait velocity interpretation: 1.31 - 2.62 ft/sec, indicative of limited community ambulator General Gait Details: Slow, shuffling steps, min guard assist for safety and balance.                   Balance  Overall balance assessment: Needs assistance Sitting-balance support: Feet supported;No upper extremity supported Sitting balance-Leahy Scale: Good     Standing balance support: Single extremity supported Standing balance-Leahy Scale: Fair Standing balance comment: Improving, but still better with at least one UE support.                              Cognition Arousal/Alertness: Awake/alert Behavior During Therapy: WFL for tasks assessed/performed Overall Cognitive Status: Impaired/Different from baseline                               Problem Solving: Slow processing General Comments: better compared to last session and again may be loud fan in room and difficulty hearing.          General Comments General comments (skin integrity, edema, etc.): No reports of lightheadedness with gait and mobility today.       Pertinent Vitals/Pain Pain Assessment: Faces Faces Pain Scale: Hurts little more Pain Location: back Pain Descriptors / Indicators: Grimacing;Guarding Pain Intervention(s): Limited activity within patient's tolerance;Monitored during session;Repositioned           PT Goals (current goals can now be found in the care plan section) Acute Rehab PT Goals Patient Stated Goal: to get home feel better, get back to work Progress towards PT goals: Progressing toward goals    Frequency  Min 3X/week      PT Plan Current plan remains appropriate       AM-PAC PT "6 Clicks" Mobility   Outcome Measure  Help needed turning from your back to your side while in a flat bed without using bedrails?: None Help needed moving from lying on your back to sitting on the side of a flat bed without using bedrails?: None Help needed moving to and from a bed to a chair (including a wheelchair)?: A Little Help needed standing up from a chair using your arms (e.g., wheelchair or bedside chair)?: A Little Help needed to walk in hospital room?: A Little Help  needed climbing 3-5 steps with a railing? : A Little 6 Click Score: 20    End of Session   Activity Tolerance: Patient limited by fatigue Patient left: Other (comment)(in bathroom getting a wash up with RN in room.  ) Nurse Communication: Mobility status PT Visit Diagnosis: Muscle weakness (generalized) (M62.81);Difficulty in walking, not elsewhere classified (R26.2);Pain Pain - Right/Left: (back) Pain - part of body: (back)     Time: 3875-6433 PT Time Calculation (min) (ACUTE ONLY): 18 min  Charges:  $Gait Training: 8-22 mins          Asa Fath B. Adriann Ballweg, PT, DPT  Acute Rehabilitation [phone] pager #[phone] office            12/16/2018, 10:30 AM

## 2018-12-16 NOTE — Discharge Summary (Signed)
Eric Holloway:[id number] DOB: 05/31/1951 DOA: 12/13/2018  PCP: Benay Pike, MD  Admit date: 12/13/2018  Discharge date: 12/16/2018  Admitted From: Home   Disposition:  Home   Recommendations for Outpatient Follow-up:   Follow up with PCP in 1-2 weeks  PCP Please obtain BMP/CBC, 2 view CXR in 1week,  (see Discharge instructions)   PCP Please follow up on the following pending results: Monitor orthostatic blood pressure.   Home Health: PT, OT, RN Equipment/Devices: 3 in 1 bedside commode, rolling walker Consultations: None Discharge Condition: Stable CODE STATUS: Full Diet Recommendation: Heart Healthy low carbohydrate    Chief Complaint  Patient presents with   Fall     Brief history of present illness from the day of admission and additional interim summary    Patient is a 68 year old African-American male, with past medical history significant for prostate cancer, hypertension, hyperlipidemia, diabetes mellitus, cataract and arthritis. Apparently, patient was diagnosed with COVID about 4 days ago. Patient has been isolating himself at home. Patient had a syncopal episode after exposing himself to excessive sunlight                                                                   Hospital Course    1. Dehydration, syncope and fall due to Acute Covid 19 Viral Illness during the ongoing 2020 Covid 19 Pandemic - he does not seem to be hypoxic and currently chest x-ray stable, head and neck CT along with T and L-spine x-ray are unremarkable as well.  His inflammatory markers have stabilized, he is clinically stable no cough or shortness of breath.  He received short course of steroids.  Will be discharged home and stable from this standpoint.  COVID-19  Labs  Recent Labs    12/14/18 0654 12/15/18 0300 12/15/18 0330 12/16/18 0400  DDIMER 2.41* 1.60*  --  1.18*  FERRITIN 352*  --  389* 447*  LDH 258* 305*  --  273*  CRP 7.5*  --  8.3* 3.2*    Lab Results  Component Value Date   SARSCOV2NAA DETECTED (A) 12/14/2018     2.  Hypertension.  Placed on Coreg and ACE inhibitor discontinued due to orthostatic hypotension.  PCP to monitor.   3.  BPH -Flomax.  See #5 below  4.  Dyslipidemia.  Resume home dose Lipitor.  5.  Longstanding orthostatic hypotension and orthostatic dizziness.  His orthostatic blood pressures suggestive of orthostatic hypotension, we have stopped his ACE inhibitor, he has been hydrated and TED stockings were applied, much improved, not dizzy upon standing up, has been placed on Coreg instead of ACE inhibitor, requested to wear TED stockings and daytime.  If this problem persists PCP might have to reconsider alpha blockade use through his Flomax.  6. DM type II.  In poor outpatient control due to hyperglycemia with high A1c, for now continue home regimen and follow with PCP for better glycemic control.  Lab Results  Component Value Date   HGBA1C 8.3 (A) 12/08/2018     Discharge diagnosis     Principal Problem:   COVID-19 virus infection Active Problems:   T2DM (type 2 diabetes mellitus) ([place])   Hyperlipidemia   Essential hypertension, benign   LOW BACK PAIN, CHRONIC   Syncope    Discharge instructions    Discharge Instructions    Discharge instructions   Complete by: As directed    Follow with Primary MD Benay Pike, MD in 7 days   Get CBC, CMP, 2 view Chest X ray -  checked next visit within 1 week by Primary MD    Activity: As tolerated with Full fall precautions use walker/cane & assistance as needed. Wear TED stocking in the daytime, off at night time.  You must sit at a stationary position for 5 minutes and do leg extension exercises as taught for 5 minutes before you start  walking from a resting position or after getting up from a bed, once you stand up , stand at that spot for 3-5 minutes while holding on to a wall-bed-heavy furniture and then walk only if you are not dizzy, using a  walker at all times, if you still get dizzy sit down, and call for help.  Disposition Home    Diet: Heart Healthy  Low Carb     Special Instructions: If you have smoked or chewed Tobacco  in the last 2 yrs please stop smoking, stop any regular Alcohol  and or any Recreational drug use.  On your next visit with your primary care physician please Get Medicines reviewed and adjusted.  Please request your Prim.MD to go over all Hospital Tests and Procedure/Radiological results at the follow up, please get all Hospital records sent to your Prim MD by signing hospital release before you go home.  If you experience worsening of your admission symptoms, develop shortness of breath, life threatening emergency, suicidal or homicidal thoughts you must seek medical attention immediately by calling 911 or calling your MD immediately  if symptoms less severe.  You Must read complete instructions/literature along with all the possible adverse reactions/side effects for all the Medicines you take and that have been prescribed to you. Take any new Medicines after you have completely understood and accpet all the possible adverse reactions/side effects.   Increase activity slowly   Complete by: As directed       Discharge Medications   Allergies as of 12/16/2018   No Known Allergies     Medication List    STOP taking these medications   lisinopril 20 MG tablet Commonly known as: ZESTRIL     TAKE these medications   acetaminophen 650 MG CR tablet Commonly known as: Tylenol 8 Hour Take 1 tablet (650 mg total) by mouth every  8 (eight) hours as needed for pain.   aspirin 81 MG EC tablet Swallow whole.  Take 1 tablet every Sunday and every Wednesday What changed:   how much to  take  how to take this  when to take this  additional instructions   atorvastatin 40 MG tablet Commonly known as: LIPITOR Take 1 tablet (40 mg total) by mouth daily.   carvedilol 3.125 MG tablet Commonly known as: COREG Take 1 tablet (3.125 mg total) by mouth 2 (two) times daily with a meal.   empagliflozin 10 MG Tabs tablet Commonly known as: Jardiance Take 10 mg by mouth daily.   fluticasone 50 MCG/ACT nasal spray Commonly known as: FLONASE Place 2 sprays into both nostrils daily.   folic acid 1 MG tablet Commonly known as: FOLVITE Take 2 tablets (2 mg total) by mouth daily. What changed: how much to take   glucose blood test strip Commonly known as: Accu-Chek Aviva Plus Check sugar once in the  morning before breakfast  and once in the evening   guaiFENesin 100 MG/5ML Soln Commonly known as: ROBITUSSIN Take 5 mLs (100 mg total) by mouth every 4 (four) hours as needed for cough or to loosen phlegm.   metFORMIN 1000 MG tablet Commonly known as: GLUCOPHAGE TAKE 1 TABLET BY MOUTH TWO  TIMES DAILY WITH A MEAL   methotrexate 2.5 MG tablet Commonly known as: RHEUMATREX TAKE 4 TABLETS BY MOUTH  TWICE A WEEK ON MONDAYS AND TUESDAYS.   neomycin-polymyxin b-dexamethasone 3.5-10000-0.1 Susp Commonly known as: MAXITROL Place 2 drops into the right eye every 4 (four) hours.   omeprazole 20 MG capsule Commonly known as: PRILOSEC Take 1 capsule (20 mg total) by mouth daily.   sildenafil 20 MG tablet Commonly known as: REVATIO Take 1 tablet (20 mg total) by mouth 3 (three) times daily.   tamsulosin 0.4 MG Caps capsule Commonly known as: FLOMAX Take 1 capsule (0.4 mg total) by mouth daily.            Durable Medical Equipment  (From admission, onward)         Start     Ordered   12/16/18 0927  For home use only DME 3 n 1  Once     06 /12/20 0926   12/15/18 0742  For home use only DME Walker rolling  Once    Comments: 5 wheel  Question:  Patient needs a  walker to treat with the following condition  Answer:  COVID-19 virus infection   12/15/18 0741          Follow-up Information    Benay Pike, MD. Schedule an appointment as soon as possible for a visit in 1 week(s).   Specialty: Family Medicine Contact information: [address] [place] [address] [phone]           Major procedures and Radiology Reports - PLEASE review detailed and final reports thoroughly  -        Dg Chest 1 View  Result Date: 12/13/2018 CLINICAL DATA:  Initial evaluation for acute trauma, fall. EXAM: CHEST  1 VIEW COMPARISON:  Prior radiograph from 12/24/2003. FINDINGS: Cardiomegaly. Mediastinal silhouette normal. Lungs mildly hypoinflated. No focal infiltrates. No pulmonary edema or pleural effusion. No pneumothorax. No acute osseous finding. IMPRESSION: 1. No active cardiopulmonary disease. 2. Cardiomegaly without pulmonary edema. Electronically Signed   By: Jeannine Boga M.D.   On: 12/13/2018 21:36   Dg Thoracic Spine 2 View  Result Date: 12/13/2018 CLINICAL DATA:  Initial evaluation for acute trauma, fall. EXAM: THORACIC SPINE 2 VIEWS COMPARISON:  None. FINDINGS: Mild dextroscoliosis. Alignment otherwise normal with preservation of the normal thoracic kyphosis. No listhesis or malalignment. Vertebral body heights maintained without evidence for acute or chronic fracture. Moderate multilevel degenerative endplate spurring seen throughout the mid and lower thoracic spine. Visualized soft tissues demonstrate no acute finding. IMPRESSION: No radiographic evidence for acute traumatic injury within the thoracic spine. Electronically Signed   By: Jeannine Boga M.D.   On: 12/13/2018 21:38   Dg Lumbar Spine Complete  Result Date: 12/13/2018 CLINICAL DATA:  Initial evaluation for acute trauma, fall. EXAM: LUMBAR SPINE - COMPLETE 4+ VIEW COMPARISON:  Prior MRI from 10/14/2011. FINDINGS: Five non rib-bearing lumbar type vertebral bodies.  Vertebral bodies normally aligned with preservation of the normal lumbar lordosis. Vertebral body heights maintained. Visualized sacrum and pelvis intact. No acute fracture or malalignment. SI joints approximated symmetric. Moderate degenerative endplate spurring seen throughout the lumbar spine, most notable at L4-5 on the left. Visualized soft tissues demonstrate no acute finding. Degenerative changes about the hips bilaterally. IMPRESSION: No radiographic evidence for acute traumatic injury within the lumbar spine. Electronically Signed   By: Jeannine Boga M.D.   On: 12/13/2018 21:41   Ct Head Wo Contrast  Result Date: 12/13/2018 CLINICAL DATA:  Pain status post fall. EXAM: CT HEAD WITHOUT CONTRAST CT CERVICAL SPINE WITHOUT CONTRAST TECHNIQUE: Multidetector CT imaging of the head and cervical spine was performed following the standard protocol without intravenous contrast. Multiplanar CT image reconstructions of the cervical spine were also generated. COMPARISON:  None. FINDINGS: CT HEAD FINDINGS Brain: No evidence of acute infarction, hemorrhage, hydrocephalus, extra-axial collection or mass lesion/mass effect. Vascular: No hyperdense vessel or unexpected calcification. Skull: Normal. Negative for fracture or focal lesion. Mild posterior scalp swelling is noted. Sinuses/Orbits: There is some mild mucosal thickening of the left sphenoid sinus, otherwise the remaining paranasal sinuses and mastoid air cells are essentially clear. Other: None. CT CERVICAL SPINE FINDINGS Alignment: Normal. Skull base and vertebrae: No acute fracture. No primary bone lesion or focal pathologic process. Soft tissues and spinal canal: No prevertebral fluid or swelling. No visible canal hematoma. Disc levels:  There is minimal multilevel disc height loss. Upper chest: Negative. Other: None IMPRESSION: 1. No acute intracranial abnormality. 2. No acute cervical spine fracture. 3. Mild posterior scalp swelling without evidence of  an underlying fracture. Electronically Signed   By: Constance Holster M.D.   On: 12/13/2018 22:41   Ct Cervical Spine Wo Contrast  Result Date: 12/13/2018 CLINICAL DATA:  Pain status post fall. EXAM: CT HEAD WITHOUT CONTRAST CT CERVICAL SPINE WITHOUT CONTRAST TECHNIQUE: Multidetector CT imaging of the head and cervical spine was performed following the standard protocol without intravenous contrast. Multiplanar CT image reconstructions of the cervical spine were also generated. COMPARISON:  None. FINDINGS: CT HEAD FINDINGS Brain: No evidence of acute infarction, hemorrhage, hydrocephalus, extra-axial collection or mass lesion/mass effect. Vascular: No hyperdense vessel or unexpected calcification. Skull: Normal. Negative for fracture or focal lesion. Mild posterior scalp swelling is noted. Sinuses/Orbits: There is some mild mucosal thickening of the left sphenoid sinus, otherwise the remaining paranasal sinuses and mastoid air cells are essentially clear. Other: None. CT CERVICAL SPINE FINDINGS Alignment: Normal. Skull base and vertebrae: No acute fracture. No primary bone lesion or focal pathologic process. Soft tissues and spinal canal: No prevertebral fluid or swelling. No visible canal hematoma. Disc levels:  There is minimal multilevel disc height loss. Upper chest: Negative.  Other: None IMPRESSION: 1. No acute intracranial abnormality. 2. No acute cervical spine fracture. 3. Mild posterior scalp swelling without evidence of an underlying fracture. Electronically Signed   By: Constance Holster M.D.   On: 12/13/2018 22:41   Dg Chest Port 1 View  Result Date: 12/14/2018 CLINICAL DATA:  Shortness of breath. EXAM: PORTABLE CHEST 1 VIEW COMPARISON:  One-view chest x-ray 12/13/2018 FINDINGS: The heart size is exaggerated by low lung volumes. Chronic interstitial opacities are again seen. No significant airspace consolidation is present. There is no edema or effusion. Mild degenerative changes are noted at  the left shoulder. IMPRESSION: 1. Low lung volumes. 2. No acute cardiopulmonary disease. Electronically Signed   By: San Morelle M.D.   On: 12/14/2018 06:36    Micro Results     Recent Results (from the past 240 hour(s))  Blood culture (routine x 2)     Status: None (Preliminary result)   Collection Time: 12/13/18  9:50 PM   Specimen: BLOOD  Result Value Ref Range Status   Specimen Description BLOOD LEFT ARM  Final   Special Requests   Final    BOTTLES DRAWN AEROBIC AND ANAEROBIC Blood Culture results may not be optimal due to an inadequate volume of blood received in culture bottles   Culture   Final    NO GROWTH 3 DAYS Performed at [facility], [place] [address] [place], [place] [address]    Report Status PENDING  Incomplete  Blood culture (routine x 2)     Status: None (Preliminary result)   Collection Time: 12/13/18  9:56 PM   Specimen: BLOOD  Result Value Ref Range Status   Specimen Description BLOOD LEFT FOREARM  Final   Special Requests   Final    BOTTLES DRAWN AEROBIC AND ANAEROBIC Blood Culture adequate volume   Culture   Final    NO GROWTH 3 DAYS Performed at [facility], [place] [address], [place], [place] [address]    Report Status PENDING  Incomplete  SARS Coronavirus 2     Status: Abnormal   Collection Time: 12/14/18 12:38 AM  Result Value Ref Range Status   SARS Coronavirus 2 DETECTED (A) NOT DETECTED Final    Comment: RESULT CALLED TO, READ BACK BY AND VERIFIED WITH: H. BILLITER,RN 0251 12/14/2018 T. TYSOR (NOTE) SARS-CoV-2 target nucleic acids are DETECTED. The SARS-CoV-2 RNA is generally detectable in upper and lower respiratory specimens during the acute phase of infection. Positive results are indicative of active infection with SARS-CoV-2. Clinical  correlation with patient history and other diagnostic information is necessary to determine patient infection status. Positive results do  not rule out bacterial infection or co-infection  with other viruses. The expected result is Not Detected. Fact Sheet for Patients: http://www.biofiredefense.com/wp-content/uploads/2020/03/BIOFIRE-COVID -19-patients.pdf Fact Sheet for Healthcare Providers: http://www.biofiredefense.com/wp-content/uploads/2020/03/BIOFIRE-COVID -19-hcp.pdf This test is not yet approved or cleared by the [place] and  has been authorized for detection and/or diagnosis of SARS-CoV-2 by FDA under an Patent examiner y Insurance account manager (EUA).  This EUA will remain in effect (meaning this test can be used) for the duration of  the COVID-19 declaration under Section 564(b)(1) of the Act, 21 U.S.C. section [phone] 3(b)(1), unless the authorization is terminated or revoked sooner. Performed at [facility], [place] [address] [place], [place] [address]   Culture, Urine     Status: None   Collection Time: 12/14/18  9:43 AM   Specimen: Urine, Clean Catch  Result Value Ref Range Status  Specimen Description   Final    URINE, CLEAN CATCH Performed at [facility], [place] [address], [place], [place] [address]    Special Requests   Final    NONE Performed at [facility], [place] [address], [place], [place] [address]    Culture   Final    NO GROWTH Performed at [facility], [place] [address] [place], [place] [address]    Report Status 12/15/2018 FINAL  Final    Today   Subjective    Eric Holloway today has no headache,no chest abdominal pain,no new weakness tingling or numbness, feels much better wants to go home today.     Objective   Blood pressure (!) 141/72, pulse 77, temperature 98.3 F (36.8 C), temperature source Oral, resp. rate 18, height 6\' 3"  (1.905 m), weight 108.4 kg, SpO2 97 %.   Intake/Output Summary (Last 24 hours) at 12/16/2018 0948 Last data filed at 12/16/2018 0800 Gross per 24 hour  Intake 3090.32 ml  Output 1150 ml  Net 1940.32 ml    Exam Awake Alert, Oriented x 3, No new F.N  deficits, Normal affect .AT,PERRAL Supple Neck,No JVD, No cervical lymphadenopathy appriciated.  Symmetrical Chest wall movement, Good air movement bilaterally, CTAB RRR,No Gallops,Rubs or new Murmurs, No Parasternal Heave +ve B.Sounds, Abd Soft, Non tender, No organomegaly appriciated, No rebound -guarding or rigidity. No Cyanosis, Clubbing or edema, No new Rash or bruise   Data Review   CBC w Diff:  Lab Results  Component Value Date   WBC 13.4 (H) 12/16/2018   HGB 11.0 (L) 12/16/2018   HGB 12.4 (L) 08/12/2017   HCT 35.5 (L) 12/16/2018   HCT 38.7 08/12/2017   PLT 236 12/16/2018   PLT 265 08/12/2017   LYMPHOPCT 5 12/16/2018   MONOPCT 3 12/16/2018   EOSPCT 0 12/16/2018   BASOPCT 0 12/16/2018    CMP:  Lab Results  Component Value Date   NA 138 12/16/2018   NA 142 08/12/2017   K 4.9 12/16/2018   CL 102 12/16/2018   CO2 25 12/16/2018   BUN 22 12/16/2018   BUN 12 08/12/2017   CREATININE 0.97 12/16/2018   CREATININE 1.14 11/29/2018   PROT 6.2 (L) 12/16/2018   PROT 6.6 08/12/2017   ALBUMIN 2.5 (L) 12/16/2018   ALBUMIN 3.8 08/12/2017   BILITOT 0.3 12/16/2018   BILITOT 0.7 08/12/2017   ALKPHOS 42 12/16/2018   AST 37 12/16/2018   ALT 26 12/16/2018  .   Total Time in preparing paper work, data evaluation and todays exam - 23 minutes  Lala Lund M.D on 12/16/2018 at 9:48 AM  [facility]   Office  [phone]

## 2018-12-16 NOTE — Progress Notes (Signed)
Pt declined [place] at present time. Pt will however take RW from [employer].

## 2018-12-17 LAB — INTERLEUKIN-6, PLASMA: Interleukin-6, Plasma: 3.8 pg/mL (ref 0.0–12.2)

## 2018-12-18 LAB — CULTURE, BLOOD (ROUTINE X 2)
Culture: NO GROWTH
Culture: NO GROWTH
Special Requests: ADEQUATE

## 2018-12-19 ENCOUNTER — Other Ambulatory Visit: Payer: Self-pay

## 2018-12-19 NOTE — Patient Outreach (Signed)
[facility]) Care Management  12/19/2018  Eric Holloway [birth year] [id number]   Medication Adherence call to Eric Holloway patient did not answer pt is past due on Lisinopril 20 mg and Atorvastatin 40 mg under [place].   [facility] Management Direct Dial [phone]  Fax [phone] [email or web address]

## 2018-12-21 ENCOUNTER — Other Ambulatory Visit: Payer: Self-pay

## 2018-12-21 ENCOUNTER — Telehealth (INDEPENDENT_AMBULATORY_CARE_PROVIDER_SITE_OTHER): Payer: Medicare Other | Admitting: Family Medicine

## 2018-12-21 DIAGNOSIS — E119 Type 2 diabetes mellitus without complications: Secondary | ICD-10-CM | POA: Diagnosis not present

## 2018-12-21 DIAGNOSIS — R55 Syncope and collapse: Secondary | ICD-10-CM

## 2018-12-21 DIAGNOSIS — Z09 Encounter for follow-up examination after completed treatment for conditions other than malignant neoplasm: Secondary | ICD-10-CM | POA: Diagnosis not present

## 2018-12-21 NOTE — Progress Notes (Signed)
[facility] Telemedicine Visit  Patient consented to have virtual visit. Method of visit: Telephone  Encounter participants: Patient: Eric Holloway - located at home Provider: Benay Pike - located at [facility] Others (if applicable): none  Chief Complaint: dizziness  HPI:  Patient was recently hospitalized after a syncopal episode.  He was also diagnosed with COVID at this time.  Patient had a cough and loss of sense of taste.  He is says the cough and loss of taste sensation has resolved.  During patient's syncopal episode he was working outside and says he got overheated.  Patient has been experiencing "dizziness" for several years now but this is the first time he has fallen or had a syncopal episode from what he can recall.  Patient states the dizziness is not any worse than it has been previously.  Patient has recently been started on an SGLT2 inhibitor, but did not start taking this until after he was discharged from the hospital for syncopal event.  Patient states he would need a new glucose meter as his current one is old and does not work.  ROS: per HPI  Pertinent PMHx: Presyncope, diabetes, hypertension, rheumatoid arthritis.  Exam:  Respiratory: Patient not coughing.  Does not appear to have any increased work of breathing.  Able to speak full sentences without difficulty.  Assessment/Plan:  T2DM (type 2 diabetes mellitus) ([place]) Patient needs another glucometer.  Will send in prescription for when that is covered by his insurance.  Syncope Patient had a syncopal episode coinciding with coronavirus infection last week that resulted in hospitalization.  Patient states he was outside in the heat and attributes this syncopal episode to that.  Patient has had history of unsteady gait and presyncopal episodes in the past 2 years, but this is his first time he is had a syncopal episode or has fallen.  Uncertain of the cause but in the past most likely  attributed to medication he takes for rheumatoid arthritis. -Consider vestibular rehab referral on next visit after further evaluation.   Glucose meter Time spent during visit with patient: 8 minutes

## 2018-12-23 MED ORDER — BLOOD GLUCOSE MONITOR KIT: PACK | 0 refills | Status: DC

## 2018-12-23 NOTE — Assessment & Plan Note (Signed)
Patient needs another glucometer.  Will send in prescription for when that is covered by his insurance.

## 2018-12-23 NOTE — Assessment & Plan Note (Signed)
Patient had a syncopal episode coinciding with coronavirus infection last week that resulted in hospitalization.  Patient states he was outside in the heat and attributes this syncopal episode to that.  Patient has had history of unsteady gait and presyncopal episodes in the past 2 years, but this is his first time he is had a syncopal episode or has fallen.  Uncertain of the cause but in the past most likely attributed to medication he takes for rheumatoid arthritis. -Consider vestibular rehab referral on next visit after further evaluation.
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
## 2019-01-05 ENCOUNTER — Other Ambulatory Visit: Payer: Self-pay

## 2019-01-05 ENCOUNTER — Ambulatory Visit: Payer: Self-pay | Admitting: Physician Assistant

## 2019-01-05 NOTE — Patient Outreach (Signed)
[facility]) Care Management  01/05/2019  Eric Holloway 01/29/1951 [id number]   Medication Adherence call to Eric Holloway [place] Identifiers Verify Eric Holloway is showing past due on Lisinopril 20 mg and Atorvastatin 40 mg patient explain he is no longer taking Lisinopril doctor took him off, he is still taking Atorvastatin but has medication at this time. Eric Holloway is showing past due under [place].   [place] Management Direct Dial [phone]  Fax [phone] [email or web address]

## 2019-01-10 ENCOUNTER — Other Ambulatory Visit: Payer: Self-pay

## 2019-01-10 ENCOUNTER — Ambulatory Visit: Payer: Medicare Other | Admitting: Physician Assistant

## 2019-01-10 ENCOUNTER — Encounter: Payer: Self-pay | Admitting: Physician Assistant

## 2019-01-10 VITALS — BP 126/72 | HR 68 | Resp 15 | Ht 75.0 in | Wt 244.0 lb

## 2019-01-10 DIAGNOSIS — Z8679 Personal history of other diseases of the circulatory system: Secondary | ICD-10-CM

## 2019-01-10 DIAGNOSIS — Z8639 Personal history of other endocrine, nutritional and metabolic disease: Secondary | ICD-10-CM

## 2019-01-10 DIAGNOSIS — Z79899 Other long term (current) drug therapy: Secondary | ICD-10-CM | POA: Diagnosis not present

## 2019-01-10 DIAGNOSIS — M5030 Other cervical disc degeneration, unspecified cervical region: Secondary | ICD-10-CM | POA: Diagnosis not present

## 2019-01-10 DIAGNOSIS — M19071 Primary osteoarthritis, right ankle and foot: Secondary | ICD-10-CM

## 2019-01-10 DIAGNOSIS — M0579 Rheumatoid arthritis with rheumatoid factor of multiple sites without organ or systems involvement: Secondary | ICD-10-CM

## 2019-01-10 DIAGNOSIS — M19072 Primary osteoarthritis, left ankle and foot: Secondary | ICD-10-CM

## 2019-01-10 DIAGNOSIS — M19041 Primary osteoarthritis, right hand: Secondary | ICD-10-CM | POA: Diagnosis not present

## 2019-01-10 DIAGNOSIS — M069 Rheumatoid arthritis, unspecified: Secondary | ICD-10-CM

## 2019-01-10 DIAGNOSIS — M19042 Primary osteoarthritis, left hand: Secondary | ICD-10-CM

## 2019-01-10 DIAGNOSIS — M5136 Other intervertebral disc degeneration, lumbar region: Secondary | ICD-10-CM

## 2019-01-10 DIAGNOSIS — Z8546 Personal history of malignant neoplasm of prostate: Secondary | ICD-10-CM

## 2019-01-10 DIAGNOSIS — Z87448 Personal history of other diseases of urinary system: Secondary | ICD-10-CM

## 2019-01-10 MED ORDER — METHOTREXATE 2.5 MG PO TABS: ORAL_TABLET | ORAL | 0 refills | Status: DC

## 2019-01-10 NOTE — Patient Instructions (Signed)
Standing Labs We placed an order today for your standing lab work.    Please come back and get your standing labs in September and every 3 months  We have open lab daily Monday through Thursday from 8:30-12:30 PM and 1:30-4:30 PM and Friday from 8:30-12:30 PM and 1:30 -4:00 PM at the office of Dr. Shaili Deveshwar.   You may experience shorter wait times on Monday and Friday afternoons. The office is located at [address], [address], [place], [place] [address] No appointment is necessary.   Labs are drawn by [facility].  You may receive a bill from [facility] for your lab work.  If you wish to have your labs drawn at another location, please call the office 24 hours in advance to send orders.  If you have any questions regarding directions or hours of operation,  please call [phone].   Just as a reminder please drink plenty of water prior to coming for your lab work. Thanks!  

## 2019-01-16 ENCOUNTER — Ambulatory Visit (INDEPENDENT_AMBULATORY_CARE_PROVIDER_SITE_OTHER): Payer: Medicare Other | Admitting: Family Medicine

## 2019-01-16 ENCOUNTER — Encounter: Payer: Self-pay | Admitting: Family Medicine

## 2019-01-16 ENCOUNTER — Other Ambulatory Visit: Payer: Self-pay

## 2019-01-16 DIAGNOSIS — E119 Type 2 diabetes mellitus without complications: Secondary | ICD-10-CM

## 2019-01-16 DIAGNOSIS — I10 Essential (primary) hypertension: Secondary | ICD-10-CM | POA: Diagnosis not present

## 2019-01-16 DIAGNOSIS — R42 Dizziness and giddiness: Secondary | ICD-10-CM | POA: Diagnosis not present

## 2019-01-16 MED ORDER — CARVEDILOL 3.125 MG PO TABS: 3.1250 mg | ORAL_TABLET | Freq: Two times a day (BID) | ORAL | 3 refills | Status: DC

## 2019-01-16 MED ORDER — JARDIANCE 10 MG PO TABS: 10.0000 mg | ORAL_TABLET | Freq: Every day | ORAL | 3 refills | Status: DC

## 2019-01-16 NOTE — Patient Instructions (Signed)
It is great to see you again today Eric Holloway,  I have refilled your prescriptions for Jardiance and carvedilol.  It is great that you balance is doing much better, but if it starts to get worse, we can discuss sending you to the vestibular therapy people.  I would like you to come back in 3 months.  Have a great day,  Clemetine Marker, MD

## 2019-01-16 NOTE — Progress Notes (Signed)
   [facility] Phone: [phone]   cc: Diabetes follow-up, syncope/dizziness  Subjective:  Patient has not had any more syncopal episodes since the one that caused him to be admitted to [facility].  He states he is feeling better since his discharge.  We discussed the possibility of referring him to vestibular rehab given his continued complaints of dizziness and unsteadiness on his feet, but he says for the first time he is actually feeling like the symptoms have been improving.  He would like to hold off on that for now.  Diabetes: Patient states that his glucometer has not come in yet.  He also states that he needs a refill on his Jardiance.  ROS: See HPI for pertinent positives and negatives  Past Medical History  Family history reviewed for today's visit. No changes.  Social history- patient is a non-smoker  Objective: BP 124/60   Pulse 70   SpO2 98%  Gen: NAD, alert and oriented, cooperative with exam CV: normal rate, regular rhythm. No murmurs, no rubs.  Resp: LCTAB, no wheezes, crackles. normal work of breathing GI: nontender to palpation, BS present, no guarding or organomegaly Psych: Appropriate behavior  Assessment/Plan: T2DM (type 2 diabetes mellitus) ([place]) Patient needs a refill on his Jardiance.  He is taking all his medications daily.  Will need to follow-up in 3 months for a repeat A1c.  Last reading in June was 8.3, which was an increase from his previous measurement.  Dizziness Patient states the symptoms of unsteadiness on his feet and presyncope have improved since his hospital discharge. He would like to hold off on a referral to vestibular therapy, but is open to it in the future.    Essential hypertension, benign bp 124/60 today.  Pt needs refill on carvedilol.  - refilled carvedilol.     Clemetine Marker, MD PGY-2

## 2019-01-17 NOTE — Assessment & Plan Note (Signed)
bp 124/60 today.  Pt needs refill on carvedilol.  - refilled carvedilol.

## 2019-01-17 NOTE — Assessment & Plan Note (Signed)
Patient states the symptoms of unsteadiness on his feet and presyncope have improved since his hospital discharge. He would like to hold off on a referral to vestibular therapy, but is open to it in the future.

## 2019-01-17 NOTE — Assessment & Plan Note (Signed)
Patient needs a refill on his Jardiance.  He is taking all his medications daily.  Will need to follow-up in 3 months for a repeat A1c.  Last reading in June was 8.3, which was an increase from his previous measurement.

## 2019-01-19 DIAGNOSIS — R3912 Poor urinary stream: Secondary | ICD-10-CM | POA: Diagnosis not present

## 2019-01-26 ENCOUNTER — Other Ambulatory Visit: Payer: Self-pay

## 2019-01-26 MED ORDER — CARVEDILOL 3.125 MG PO TABS: 3.1250 mg | ORAL_TABLET | Freq: Two times a day (BID) | ORAL | 3 refills | Status: DC

## 2019-01-26 MED ORDER — BLOOD GLUCOSE MONITOR KIT: PACK | 0 refills | Status: DC

## 2019-02-20 DIAGNOSIS — H25813 Combined forms of age-related cataract, bilateral: Secondary | ICD-10-CM | POA: Diagnosis not present

## 2019-02-20 DIAGNOSIS — E119 Type 2 diabetes mellitus without complications: Secondary | ICD-10-CM | POA: Diagnosis not present

## 2019-02-20 LAB — HM DIABETES EYE EXAM

## 2019-03-01 DIAGNOSIS — E7800 Pure hypercholesterolemia, unspecified: Secondary | ICD-10-CM | POA: Diagnosis not present

## 2019-03-01 DIAGNOSIS — R0602 Shortness of breath: Secondary | ICD-10-CM | POA: Diagnosis not present

## 2019-03-01 DIAGNOSIS — R350 Frequency of micturition: Secondary | ICD-10-CM | POA: Diagnosis not present

## 2019-03-01 DIAGNOSIS — Z0000 Encounter for general adult medical examination without abnormal findings: Secondary | ICD-10-CM | POA: Diagnosis not present

## 2019-03-01 DIAGNOSIS — E1165 Type 2 diabetes mellitus with hyperglycemia: Secondary | ICD-10-CM | POA: Diagnosis not present

## 2019-03-01 DIAGNOSIS — M069 Rheumatoid arthritis, unspecified: Secondary | ICD-10-CM | POA: Diagnosis not present

## 2019-03-01 DIAGNOSIS — R5383 Other fatigue: Secondary | ICD-10-CM | POA: Diagnosis not present

## 2019-03-01 DIAGNOSIS — E559 Vitamin D deficiency, unspecified: Secondary | ICD-10-CM | POA: Diagnosis not present

## 2019-03-01 DIAGNOSIS — Z1159 Encounter for screening for other viral diseases: Secondary | ICD-10-CM | POA: Diagnosis not present

## 2019-03-14 ENCOUNTER — Other Ambulatory Visit: Payer: Self-pay

## 2019-03-14 DIAGNOSIS — E118 Type 2 diabetes mellitus with unspecified complications: Secondary | ICD-10-CM

## 2019-03-14 DIAGNOSIS — E785 Hyperlipidemia, unspecified: Secondary | ICD-10-CM

## 2019-03-14 DIAGNOSIS — Z8546 Personal history of malignant neoplasm of prostate: Secondary | ICD-10-CM

## 2019-03-14 MED ORDER — TAMSULOSIN HCL 0.4 MG PO CAPS: 0.4000 mg | ORAL_CAPSULE | Freq: Every day | ORAL | 3 refills | Status: DC

## 2019-03-14 MED ORDER — METFORMIN HCL 1000 MG PO TABS: ORAL_TABLET | ORAL | 3 refills | Status: DC

## 2019-03-14 MED ORDER — ATORVASTATIN CALCIUM 40 MG PO TABS: 40.0000 mg | ORAL_TABLET | Freq: Every day | ORAL | 3 refills | Status: DC

## 2019-03-15 ENCOUNTER — Other Ambulatory Visit: Payer: Self-pay | Admitting: Physician Assistant

## 2019-03-15 ENCOUNTER — Other Ambulatory Visit: Payer: Self-pay | Admitting: Family Medicine

## 2019-03-15 DIAGNOSIS — M069 Rheumatoid arthritis, unspecified: Secondary | ICD-10-CM

## 2019-03-17 DIAGNOSIS — M79604 Pain in right leg: Secondary | ICD-10-CM | POA: Diagnosis not present

## 2019-03-17 DIAGNOSIS — M79605 Pain in left leg: Secondary | ICD-10-CM | POA: Diagnosis not present

## 2019-03-17 DIAGNOSIS — Z136 Encounter for screening for cardiovascular disorders: Secondary | ICD-10-CM | POA: Diagnosis not present

## 2019-03-17 DIAGNOSIS — R42 Dizziness and giddiness: Secondary | ICD-10-CM | POA: Diagnosis not present

## 2019-03-18 DIAGNOSIS — M79674 Pain in right toe(s): Secondary | ICD-10-CM | POA: Diagnosis not present

## 2019-03-18 DIAGNOSIS — M79675 Pain in left toe(s): Secondary | ICD-10-CM | POA: Diagnosis not present

## 2019-03-18 DIAGNOSIS — E119 Type 2 diabetes mellitus without complications: Secondary | ICD-10-CM | POA: Diagnosis not present

## 2019-03-18 DIAGNOSIS — B351 Tinea unguium: Secondary | ICD-10-CM | POA: Diagnosis not present

## 2019-03-20 DIAGNOSIS — Z79899 Other long term (current) drug therapy: Secondary | ICD-10-CM | POA: Diagnosis not present

## 2019-03-20 DIAGNOSIS — N401 Enlarged prostate with lower urinary tract symptoms: Secondary | ICD-10-CM | POA: Diagnosis not present

## 2019-03-20 DIAGNOSIS — E1165 Type 2 diabetes mellitus with hyperglycemia: Secondary | ICD-10-CM | POA: Diagnosis not present

## 2019-03-20 DIAGNOSIS — E7800 Pure hypercholesterolemia, unspecified: Secondary | ICD-10-CM | POA: Diagnosis not present

## 2019-03-20 DIAGNOSIS — I10 Essential (primary) hypertension: Secondary | ICD-10-CM | POA: Diagnosis not present

## 2019-03-20 DIAGNOSIS — Z1159 Encounter for screening for other viral diseases: Secondary | ICD-10-CM | POA: Diagnosis not present

## 2019-04-13 ENCOUNTER — Encounter: Payer: Self-pay | Admitting: Family Medicine

## 2019-05-03 ENCOUNTER — Other Ambulatory Visit: Payer: Self-pay | Admitting: Family Medicine

## 2019-05-15 ENCOUNTER — Other Ambulatory Visit: Payer: Self-pay

## 2019-05-15 NOTE — Patient Outreach (Signed)
  [facility]) Care Management Chronic Special Needs Program  05/15/2019  Name: Eric Holloway DOB: 1951/03/27  MRN: [id number]  [name] is enrolled in a chronic special needs plan for  Diabetes. A completed health risk assessment has not been received from the client and client has not responded to outreach attempts by their health care concierge.  The client's individualized care plan was developed based on available data.   Plan:  . Send unsuccessful outreach letter with a copy of individualized care plan to client . Send individualized care plan to provider . Send educational material to client  Chronic care management coordinator will attempt outreach in within 2 to 4 months.  Quinn Plowman RN,BSN,CCM [place]  [phone]

## 2019-05-31 DIAGNOSIS — E1165 Type 2 diabetes mellitus with hyperglycemia: Secondary | ICD-10-CM | POA: Diagnosis not present

## 2019-05-31 DIAGNOSIS — N401 Enlarged prostate with lower urinary tract symptoms: Secondary | ICD-10-CM | POA: Diagnosis not present

## 2019-05-31 DIAGNOSIS — E7800 Pure hypercholesterolemia, unspecified: Secondary | ICD-10-CM | POA: Diagnosis not present

## 2019-05-31 DIAGNOSIS — I10 Essential (primary) hypertension: Secondary | ICD-10-CM | POA: Diagnosis not present

## 2019-06-05 ENCOUNTER — Ambulatory Visit: Payer: Self-pay

## 2019-06-09 NOTE — Progress Notes (Signed)
Virtual Visit via Telephone Note  I connected with Eric Holloway on 06/13/19 at  9:40 AM EST by telephone and verified that I am speaking with the correct person using two identifiers.  Location: Patient: Home  Provider: Clinic  This service was conducted via virtual visit.   The patient was located at home. I was located in my office.  Consent was obtained prior to the virtual visit and is aware of possible charges through their insurance for this visit.  The patient is an established patient.  Dr. Estanislado Pandy, MD conducted the virtual visit and Hazel Sams, PA-C acted as scribe during the service.  Office staff helped with scheduling follow up visits after the service was conducted.   I discussed the limitations, risks, security and privacy concerns of performing an evaluation and management service by telephone and the availability of in person appointments. I also discussed with the patient that there may be a patient responsible charge related to this service. The patient expressed understanding and agreed to proceed.  CC: Morning stiffness  History of Present Illness: Patient is a 68 year old male with a past medical history of seropositive rheumatoid arthritis.  He takes methotrexate 4 tablets by mouth on Mondays and 4 tablets on tuesdays.  He has not missed any doses recently. He denies any recent rheumatoid arthritis flares.  He denies any joint pain or joint swelling at this time.  He experiences morning stiffness for about 5 minutes in the morning.  He does not have any new concerns at this time.  He rates his RA a 5/10 currently.    Review of Systems  Constitutional: Negative for fever and malaise/fatigue.  Eyes: Negative for photophobia, pain, discharge and redness.  Respiratory: Negative for cough, shortness of breath and wheezing.   Cardiovascular: Negative for chest pain and palpitations.  Gastrointestinal: Negative for blood in stool, constipation and diarrhea.   Genitourinary: Negative for dysuria.  Musculoskeletal: Negative for back pain, joint pain, myalgias and neck pain.       +Morning stiffness   Skin: Negative for rash.  Neurological: Negative for dizziness and headaches.  Psychiatric/Behavioral: Negative for depression. The patient is not nervous/anxious and does not have insomnia.       Observations/Objective: Physical Exam  Constitutional: He is oriented to person, place, and time.  Neurological: He is alert and oriented to person, place, and time.  Psychiatric: Mood, memory, affect and judgment normal.    Patient reports morning stiffness for 5 minutes.   Patient denies nocturnal pain.  Difficulty dressing/grooming: Denies Difficulty climbing stairs: Denies Difficulty getting out of chair: Denies Difficulty using hands for taps, buttons, cutlery, and/or writing: Denies  Assessment and Plan: Visit Diagnoses: Rheumatoid arthritis involving multiple sites with positive rheumatoid factor ([place]) - He has not had any recent rheumatoid arthritis flares. He is clinically doing well on Methotrexate 4 tables by mouth twice weekly and folic acid 2 mg po daily.  He has no joint pain or joint swelling at this time.  He experiences 5 minutes of morning stiffness.  He will continue taking MTX and folic acid as prescribed.  He does not need any refills at this time.  He was advised to notify us if he develops increased joint pain or joint swelling.  He will follow up in 4 months.   High risk medication use - Methotrexate 2.5 mg 4 tablets twice a week on Mondays and Tuesdays only and folic acid 1 mg 2 tablets daily.  He is  due to update CBC and CMP.  He will go for lab work today. Lab orders were released.  Standing orders were placed today.   Primary osteoarthritis of both hands - He experiences stiffness in both hands for about 5 minutes in the morning.  He has no joint pain or joint swelling at this time.  Joint protection and muscle strengthening  were discussed.   Primary osteoarthritis of both feet - He has no feet pain or joint swelling currently.   DDD (degenerative disc disease), cervical - He is not having any neck pain at this time.  No symptoms of radiculopathy.   DDD (degenerative disc disease), lumbar-He is not having any lower back pain at this time.   Other medical conditions are listed as follows:   History of hypertension   History of diabetes mellitus   History of hyperlipidemia   History of prostate cancer  Hx of proteinuria syndrome   Follow Up Instructions: He will follow up in 4 months.    I discussed the assessment and treatment plan with the patient. The patient was provided an opportunity to ask questions and all were answered. The patient agreed with the plan and demonstrated an understanding of the instructions.   The patient was advised to call back or seek an in-person evaluation if the symptoms worsen or if the condition fails to improve as anticipated.  I provided 15 minutes of non-face-to-face time during this encounter.   Bo Merino, MD   Scribed by-  Hazel Sams, PA-C

## 2019-06-12 ENCOUNTER — Other Ambulatory Visit: Payer: Self-pay

## 2019-06-12 NOTE — Patient Outreach (Signed)
  [facility]) Care Management Chronic Special Needs Program    06/12/2019  Name: DOSS MCCORMACK, DOB: June 10, 1951  MRN: [id number]   Mr. Loyal Pitstick is enrolled in a chronic special needs plan for Diabetes. Second telephone attempt to patient for initial assessment. Unable to reach patient. HIPAA compliant message left with call back phone number.   PLAN; RNCM will attempt 3rd telephone call to patient within 2 weeks.   Quinn Plowman RN,BSN,CCM [place] [facility] [phone]

## 2019-06-13 ENCOUNTER — Telehealth (INDEPENDENT_AMBULATORY_CARE_PROVIDER_SITE_OTHER): Payer: HMO | Admitting: Rheumatology

## 2019-06-13 ENCOUNTER — Other Ambulatory Visit: Payer: Self-pay

## 2019-06-13 ENCOUNTER — Encounter: Payer: Self-pay | Admitting: Rheumatology

## 2019-06-13 DIAGNOSIS — Z8546 Personal history of malignant neoplasm of prostate: Secondary | ICD-10-CM

## 2019-06-13 DIAGNOSIS — M19071 Primary osteoarthritis, right ankle and foot: Secondary | ICD-10-CM | POA: Diagnosis not present

## 2019-06-13 DIAGNOSIS — M0579 Rheumatoid arthritis with rheumatoid factor of multiple sites without organ or systems involvement: Secondary | ICD-10-CM

## 2019-06-13 DIAGNOSIS — M19041 Primary osteoarthritis, right hand: Secondary | ICD-10-CM | POA: Diagnosis not present

## 2019-06-13 DIAGNOSIS — Z8639 Personal history of other endocrine, nutritional and metabolic disease: Secondary | ICD-10-CM

## 2019-06-13 DIAGNOSIS — Z79899 Other long term (current) drug therapy: Secondary | ICD-10-CM

## 2019-06-13 DIAGNOSIS — M19072 Primary osteoarthritis, left ankle and foot: Secondary | ICD-10-CM | POA: Diagnosis not present

## 2019-06-13 DIAGNOSIS — M19042 Primary osteoarthritis, left hand: Secondary | ICD-10-CM

## 2019-06-13 DIAGNOSIS — Z87448 Personal history of other diseases of urinary system: Secondary | ICD-10-CM

## 2019-06-13 DIAGNOSIS — M5030 Other cervical disc degeneration, unspecified cervical region: Secondary | ICD-10-CM

## 2019-06-13 DIAGNOSIS — Z8679 Personal history of other diseases of the circulatory system: Secondary | ICD-10-CM | POA: Diagnosis not present

## 2019-06-13 DIAGNOSIS — M5136 Other intervertebral disc degeneration, lumbar region: Secondary | ICD-10-CM

## 2019-06-14 LAB — CBC WITH DIFFERENTIAL/PLATELET
Absolute Monocytes: 624 cells/uL (ref 200–950)
Basophils Absolute: 32 cells/uL (ref 0–200)
Basophils Relative: 0.4 %
Eosinophils Absolute: 88 cells/uL (ref 15–500)
Eosinophils Relative: 1.1 %
HCT: 40.5 % (ref 38.5–50.0)
Hemoglobin: 12.9 g/dL — ABNORMAL LOW (ref 13.2–17.1)
Lymphs Abs: 1360 cells/uL (ref 850–3900)
MCH: 27.9 pg (ref 27.0–33.0)
MCHC: 31.9 g/dL — ABNORMAL LOW (ref 32.0–36.0)
MCV: 87.5 fL (ref 80.0–100.0)
MPV: 11.6 fL (ref 7.5–12.5)
Monocytes Relative: 7.8 %
Neutro Abs: 5896 cells/uL (ref 1500–7800)
Neutrophils Relative %: 73.7 %
Platelets: 230 10*3/uL (ref 140–400)
RBC: 4.63 10*6/uL (ref 4.20–5.80)
RDW: 15.2 % — ABNORMAL HIGH (ref 11.0–15.0)
Total Lymphocyte: 17 %
WBC: 8 10*3/uL (ref 3.8–10.8)

## 2019-06-14 LAB — COMPLETE METABOLIC PANEL WITH GFR
AG Ratio: 1.7 (calc) (ref 1.0–2.5)
ALT: 9 U/L (ref 9–46)
AST: 13 U/L (ref 10–35)
Albumin: 4 g/dL (ref 3.6–5.1)
Alkaline phosphatase (APISO): 51 U/L (ref 35–144)
BUN: 12 mg/dL (ref 7–25)
CO2: 28 mmol/L (ref 20–32)
Calcium: 9.4 mg/dL (ref 8.6–10.3)
Chloride: 104 mmol/L (ref 98–110)
Creat: 1 mg/dL (ref 0.70–1.25)
GFR, Est African American: 89 mL/min/{1.73_m2} (ref >=60)
GFR, Est Non African American: 76 mL/min/{1.73_m2} (ref >=60)
Globulin: 2.3 g/dL (calc) (ref 1.9–3.7)
Glucose, Bld: 111 mg/dL — ABNORMAL HIGH (ref 65–99)
Potassium: 4.3 mmol/L (ref 3.5–5.3)
Sodium: 142 mmol/L (ref 135–146)
Total Bilirubin: 0.3 mg/dL (ref 0.2–1.2)
Total Protein: 6.3 g/dL (ref 6.1–8.1)

## 2019-06-14 NOTE — Progress Notes (Signed)
Glucose is 111.  Rest of CMP WNL.  Hgb is borderline low but improving.

## 2019-06-15 ENCOUNTER — Ambulatory Visit: Payer: Self-pay

## 2019-06-19 ENCOUNTER — Other Ambulatory Visit: Payer: Self-pay

## 2019-06-19 NOTE — Patient Outreach (Addendum)
  [facility]) Care Management Chronic Special Needs Program    06/19/2019  Name: Eric Holloway, DOB: [birth year]  MRN: [id number]   [name] is enrolled in a chronic special needs plan for Diabetes. Third  telephone attempt to client for initial assessment. Unable to reach. HIPAA compliant message left with call back phone number.  Assignment received.  ICP completed on 05/15/19. No HRA. Three unsuccessful outreach attempts to complete HRA. No updates noted upon chart review.   PLAN; RNCM will follow up with client within 3 months to attempt to complete HRA. Marland Kitchen RNCM will send ICP to client.   Quinn Plowman RN,BSN,CCM [place] [facility] [phone]

## 2019-06-26 ENCOUNTER — Telehealth: Payer: Self-pay | Admitting: Rheumatology

## 2019-06-26 NOTE — Telephone Encounter (Signed)
-----   Message from Shona Needles, RT sent at 06/13/2019  2:57 PM EST ----- Regarding: 4 MONTH F/U

## 2019-06-26 NOTE — Telephone Encounter (Signed)
I tried leaving patient a message on voicemail. Half way thru voicemail canceled message, and disconnected. Patient needs to schedule a follow up appointment with Hazel Sams, PAC, around 10/06/2019.

## 2019-07-12 ENCOUNTER — Other Ambulatory Visit: Payer: Self-pay

## 2019-07-12 NOTE — Patient Outreach (Signed)
  [facility]) Care Management Chronic Special Needs Program    07/12/2019  Name: THALMUS BELOTTI, DOB: 05/11/1951  MRN: [id number]   RNCM received notification that client has dis-enrolled from the [place] C-SNP plan therefore RNCM will no longer be able to follow as client's C-SNP Chronic Care Management Coordinator.   PLAN;  Close case and send case closure letter to primary care provider.   Quinn Plowman RN,BSN,CCM [place] [facility] [phone]
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
## 2019-09-08 ENCOUNTER — Ambulatory Visit: Payer: HMO | Admitting: Rheumatology

## 2019-09-08 ENCOUNTER — Encounter: Payer: Self-pay | Admitting: Rheumatology

## 2019-09-08 ENCOUNTER — Other Ambulatory Visit: Payer: Self-pay

## 2019-09-08 VITALS — BP 155/82 | HR 62 | Resp 17 | Ht 75.0 in | Wt 240.6 lb

## 2019-09-08 DIAGNOSIS — G8929 Other chronic pain: Secondary | ICD-10-CM

## 2019-09-08 DIAGNOSIS — M5136 Other intervertebral disc degeneration, lumbar region: Secondary | ICD-10-CM

## 2019-09-08 DIAGNOSIS — M19071 Primary osteoarthritis, right ankle and foot: Secondary | ICD-10-CM | POA: Diagnosis not present

## 2019-09-08 DIAGNOSIS — Z87448 Personal history of other diseases of urinary system: Secondary | ICD-10-CM

## 2019-09-08 DIAGNOSIS — M19042 Primary osteoarthritis, left hand: Secondary | ICD-10-CM

## 2019-09-08 DIAGNOSIS — M5030 Other cervical disc degeneration, unspecified cervical region: Secondary | ICD-10-CM

## 2019-09-08 DIAGNOSIS — Z8679 Personal history of other diseases of the circulatory system: Secondary | ICD-10-CM

## 2019-09-08 DIAGNOSIS — M19041 Primary osteoarthritis, right hand: Secondary | ICD-10-CM | POA: Diagnosis not present

## 2019-09-08 DIAGNOSIS — M0579 Rheumatoid arthritis with rheumatoid factor of multiple sites without organ or systems involvement: Secondary | ICD-10-CM

## 2019-09-08 DIAGNOSIS — Z8639 Personal history of other endocrine, nutritional and metabolic disease: Secondary | ICD-10-CM

## 2019-09-08 DIAGNOSIS — M19072 Primary osteoarthritis, left ankle and foot: Secondary | ICD-10-CM

## 2019-09-08 DIAGNOSIS — Z79899 Other long term (current) drug therapy: Secondary | ICD-10-CM

## 2019-09-08 DIAGNOSIS — Z8546 Personal history of malignant neoplasm of prostate: Secondary | ICD-10-CM

## 2019-09-08 DIAGNOSIS — R519 Headache, unspecified: Secondary | ICD-10-CM

## 2019-09-08 DIAGNOSIS — G252 Other specified forms of tremor: Secondary | ICD-10-CM

## 2019-09-08 DIAGNOSIS — R42 Dizziness and giddiness: Secondary | ICD-10-CM

## 2019-09-08 NOTE — Patient Instructions (Signed)
Standing Labs We placed an order today for your standing lab work.    Please come back and get your standing labs in June and every 3 months.   We have open lab daily Monday through Thursday from 8:30-12:30 PM and 1:30-4:30 PM and Friday from 8:30-12:30 PM and 1:30-4:00 PM at the office of Dr. Ollivander See.   You may experience shorter wait times on Monday and Friday afternoons. The office is located at [address], [address], [place], [place] [address] No appointment is necessary.   Labs are drawn by [facility].  You may receive a bill from [facility] for your lab work.  If you wish to have your labs drawn at another location, please call the office 24 hours in advance to send orders.  If you have any questions regarding directions or hours of operation,  please call [phone].   Just as a reminder please drink plenty of water prior to coming for your lab work. Thanks!  

## 2019-09-09 LAB — COMPLETE METABOLIC PANEL WITH GFR
AG Ratio: 1.4 (calc) (ref 1.0–2.5)
ALT: 8 U/L — ABNORMAL LOW (ref 9–46)
AST: 10 U/L (ref 10–35)
Albumin: 3.7 g/dL (ref 3.6–5.1)
Alkaline phosphatase (APISO): 51 U/L (ref 35–144)
BUN: 13 mg/dL (ref 7–25)
CO2: 29 mmol/L (ref 20–32)
Calcium: 9.7 mg/dL (ref 8.6–10.3)
Chloride: 105 mmol/L (ref 98–110)
Creat: 1.05 mg/dL (ref 0.70–1.25)
GFR, Est African American: 84 mL/min/{1.73_m2} (ref >=60)
GFR, Est Non African American: 72 mL/min/{1.73_m2} (ref >=60)
Globulin: 2.7 g/dL (calc) (ref 1.9–3.7)
Glucose, Bld: 131 mg/dL — ABNORMAL HIGH (ref 65–99)
Potassium: 4.2 mmol/L (ref 3.5–5.3)
Sodium: 141 mmol/L (ref 135–146)
Total Bilirubin: 0.5 mg/dL (ref 0.2–1.2)
Total Protein: 6.4 g/dL (ref 6.1–8.1)

## 2019-09-09 LAB — CBC WITH DIFFERENTIAL/PLATELET
Absolute Monocytes: 562 cells/uL (ref 200–950)
Basophils Absolute: 39 cells/uL (ref 0–200)
Basophils Relative: 0.5 %
Eosinophils Absolute: 101 cells/uL (ref 15–500)
Eosinophils Relative: 1.3 %
HCT: 41.9 % (ref 38.5–50.0)
Hemoglobin: 13.6 g/dL (ref 13.2–17.1)
Lymphs Abs: 1482 cells/uL (ref 850–3900)
MCH: 29.2 pg (ref 27.0–33.0)
MCHC: 32.5 g/dL (ref 32.0–36.0)
MCV: 90.1 fL (ref 80.0–100.0)
MPV: 11 fL (ref 7.5–12.5)
Monocytes Relative: 7.2 %
Neutro Abs: 5616 cells/uL (ref 1500–7800)
Neutrophils Relative %: 72 %
Platelets: 227 10*3/uL (ref 140–400)
RBC: 4.65 10*6/uL (ref 4.20–5.80)
RDW: 14.4 % (ref 11.0–15.0)
Total Lymphocyte: 19 %
WBC: 7.8 10*3/uL (ref 3.8–10.8)

## 2019-09-11 NOTE — Progress Notes (Signed)
CBC is normal.  Glucose is mildly elevated probably not fasting.

## 2019-09-13 ENCOUNTER — Telehealth: Payer: Self-pay | Admitting: Family Medicine

## 2019-09-13 NOTE — Telephone Encounter (Signed)
Can we call this pt to schedule an appointment with me? I haven't seen him since last July and I would like to discuss his diabetes and dizziness with him again.

## 2019-09-14 NOTE — Telephone Encounter (Signed)
LVM to call office to see about getting him an appointment scheduled per Dr. Jeannine Kitten.Kaleya Douse Zimmerman Rumple, CMA

## 2019-09-15 NOTE — Telephone Encounter (Signed)
Contacted pt and appointment scheduled.Eric Holloway, CMA  

## 2019-09-18 ENCOUNTER — Ambulatory Visit: Payer: HMO

## 2019-09-21 ENCOUNTER — Other Ambulatory Visit: Payer: Self-pay

## 2019-09-21 ENCOUNTER — Encounter: Payer: Self-pay | Admitting: Neurology

## 2019-09-21 ENCOUNTER — Telehealth: Payer: Self-pay

## 2019-09-21 ENCOUNTER — Ambulatory Visit: Payer: Medicare HMO | Admitting: Neurology

## 2019-09-21 VITALS — BP 161/78 | HR 78 | Temp 97.2°F | Ht 75.0 in | Wt 236.0 lb

## 2019-09-21 DIAGNOSIS — Z9181 History of falling: Secondary | ICD-10-CM

## 2019-09-21 DIAGNOSIS — G20 Parkinson's disease: Secondary | ICD-10-CM | POA: Diagnosis not present

## 2019-09-21 DIAGNOSIS — R42 Dizziness and giddiness: Secondary | ICD-10-CM | POA: Diagnosis not present

## 2019-09-21 DIAGNOSIS — W19XXXS Unspecified fall, sequela: Secondary | ICD-10-CM

## 2019-09-21 NOTE — Telephone Encounter (Signed)
Consent for [employer] auth completed and signed by pt. Given to [place].
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
## 2019-09-21 NOTE — Patient Instructions (Signed)
I think you have signs and symptoms of Parkinson's disease, with more left sided findings. As explained, there are medication treatment options. This disease does progress with time. It can affect your balance, your memory, your mood, your bowel and bladder function, your posture, balance and walking and your activities of daily living. However, there are good supportive treatments and symptomatic treatments available, so most patients have a change to a good quality life and life expectancy is not typically altered. Overall you are doing fairly healthwise, but I do want to suggest a few things today:  Remember to drink plenty of fluid at least 6 glasses (8 oz each), eat healthy meals and do not skip any meals. Try to eat protein with a every meal and eat a healthy snack such as fruit or nuts in between meals. Try to keep a regular sleep-wake schedule and try to exercise daily, particularly in the form of walking, 20-30 minutes a day, if you can.   Try to stay active physically and mentally. Engage in social activities in your community and with your family and try to keep up with current events by reading the newspaper or watching the news. Try to do word puzzles and you may like to do puzzles and brain games on the computer such as on [email or web address]/.   As far as your medications are concerned, I would like to medication fairly soon, I would like for now to proceed with diagnostic testing with a DaT scan: This is a specialized brain scan designed to help with diagnosis of tremor disorders. A radioactive marker gets injected and the uptake is measured in the brain and compared to normal controls and right side is compared to the left, a change in uptake can help with diagnosis of certain tremor disorders. A brain MRI on the other hand is a brain scan that helps look at the brain structure in more detail overall and look for age-related changes, blood vessel related changes and look for stroke and volume loss  which we call atrophy.    I would like to see you back in 3 months, sooner if needed.  Our phone number is [phone]. We also have an after hours call service for urgent matters and there is a physician on-call for urgent questions, that cannot wait till the next work day. For any emergencies you know to call 911 or go to the nearest emergency room.   You can email me through my chart and also leave a phone message for my nurse.

## 2019-09-25 ENCOUNTER — Telehealth: Payer: Self-pay | Admitting: Rheumatology

## 2019-09-25 MED ORDER — METHOTREXATE 2.5 MG PO TABS: 20.0000 mg | ORAL_TABLET | ORAL | 0 refills | Status: DC

## 2019-09-25 NOTE — Telephone Encounter (Signed)
We have not received a refill request on MTX from pharmacy.   Last visit: 09/08/19 Next Visit: 12/08/19 Labs: 09/08/19 CBC is normal. Glucose is mildly elevated  Okay to refill per Dr. Estanislado Pandy  Patient advised we have not received a refill request from the pharmacy. Patient advised prescription has been sent to the pharmacy. Patient advised with next refill if it has been longer than 1-2 days without a response to contact the office to let us know he needs refill.

## 2019-09-25 NOTE — Telephone Encounter (Signed)
Patient needs refill on MTX sent to [facility] on [address]. Patient due today, and is out. Per patient mail order has been trying to get refill since last week. Please call to advise once refill sent into local pharmacy.

## 2019-10-10 ENCOUNTER — Ambulatory Visit: Payer: Medicare HMO | Admitting: Family Medicine

## 2019-10-10 NOTE — Progress Notes (Deleted)
    SUBJECTIVE:   CHIEF COMPLAINT / HPI:   DM: medications: metformin 1000mg  BID, canglifolozin 100mg  qd, foot exam.   Rheumatoid arthritis:   Parkinsons:   HTN: carvedilol.      PERTINENT  PMH / PSH: ***  OBJECTIVE:   There were no vitals taken for this visit.  ***  ASSESSMENT/PLAN:   No problem-specific Assessment & Plan notes found for this encounter.     Benay Pike, MD [place]

## 2019-11-15 ENCOUNTER — Encounter (HOSPITAL_COMMUNITY)
Admission: RE | Admit: 2019-11-15 | Discharge: 2019-11-15 | Disposition: A | Discharge status: Home / Self Care | Payer: Medicare HMO | Source: Ambulatory Visit | Attending: Neurology | Admitting: Neurology

## 2019-11-15 ENCOUNTER — Other Ambulatory Visit: Payer: Self-pay

## 2019-11-15 DIAGNOSIS — R42 Dizziness and giddiness: Secondary | ICD-10-CM | POA: Diagnosis not present

## 2019-11-15 DIAGNOSIS — G20 Parkinson's disease: Secondary | ICD-10-CM | POA: Diagnosis present

## 2019-11-15 DIAGNOSIS — G20C Parkinsonism, unspecified: Secondary | ICD-10-CM

## 2019-11-15 MED ORDER — IOFLUPANE I 123 185 MBQ/2.5ML IV SOLN
4.7000 | Freq: Once | INTRAVENOUS | Status: AC | PRN
  Administered 2019-11-15: 4.7 via INTRAVENOUS
  Filled 2019-11-15: qty 5

## 2019-11-15 MED ORDER — IODINE STRONG (LUGOLS) 5 % PO SOLN
ORAL | Status: AC
  Administered 2019-11-15: 0.8 mL via ORAL
  Filled 2019-11-15: qty 1

## 2019-11-20 NOTE — Progress Notes (Signed)
These call patient regarding the recent nuclear medicine DaT scan result. As discussed, this is a specialized brain scan designed to help with diagnosis of tremor disorders, including parkinsonian disorders. A radioactive marker gets injected and the uptake is measured in the brain and compared to normal controls and right side is compared to the left. A change in uptake can help with diagnosis of certain tremor disorders and narrow down the diagnostic possibilities. The patient's recent scan indicated abnormal (as in lower) uptake as compared to normal uptake pattern indicating an underlying parkinsonian disorder including Parkinson's disease. Again, this is not a definitive test for Parkinson's disease and does not distinguish between Parkinson's disease and other, atypical parkinsonism disorders, but is a helpful tool in diagnosis. We had talked about potentially utilizing medication for Parkinson's disease at our appointment.  He has a follow-up appointment pending for next month, about a month from now, if he wishes, please offer him a sooner appointment, otherwise he is welcome to keep next months appointment so we can discuss further.

## 2019-11-21 ENCOUNTER — Other Ambulatory Visit: Payer: Self-pay

## 2019-11-21 DIAGNOSIS — Z79899 Other long term (current) drug therapy: Secondary | ICD-10-CM

## 2019-11-21 LAB — CBC WITH DIFFERENTIAL/PLATELET
Absolute Monocytes: 473 cells/uL (ref 200–950)
Basophils Absolute: 19 cells/uL (ref 0–200)
Basophils Relative: 0.3 %
Eosinophils Absolute: 50 cells/uL (ref 15–500)
Eosinophils Relative: 0.8 %
HCT: 41.9 % (ref 38.5–50.0)
Hemoglobin: 13.5 g/dL (ref 13.2–17.1)
Lymphs Abs: 1191 cells/uL (ref 850–3900)
MCH: 28.7 pg (ref 27.0–33.0)
MCHC: 32.2 g/dL (ref 32.0–36.0)
MCV: 89.1 fL (ref 80.0–100.0)
MPV: 11.6 fL (ref 7.5–12.5)
Monocytes Relative: 7.5 %
Neutro Abs: 4568 cells/uL (ref 1500–7800)
Neutrophils Relative %: 72.5 %
Platelets: 217 10*3/uL (ref 140–400)
RBC: 4.7 10*6/uL (ref 4.20–5.80)
RDW: 14.5 % (ref 11.0–15.0)
Total Lymphocyte: 18.9 %
WBC: 6.3 10*3/uL (ref 3.8–10.8)

## 2019-11-21 LAB — COMPLETE METABOLIC PANEL WITH GFR
AG Ratio: 1.6 (calc) (ref 1.0–2.5)
ALT: 10 U/L (ref 9–46)
AST: 10 U/L (ref 10–35)
Albumin: 3.9 g/dL (ref 3.6–5.1)
Alkaline phosphatase (APISO): 52 U/L (ref 35–144)
BUN: 16 mg/dL (ref 7–25)
CO2: 31 mmol/L (ref 20–32)
Calcium: 9.9 mg/dL (ref 8.6–10.3)
Chloride: 105 mmol/L (ref 98–110)
Creat: 1.14 mg/dL (ref 0.70–1.25)
GFR, Est African American: 76 mL/min/{1.73_m2} (ref >=60)
GFR, Est Non African American: 65 mL/min/{1.73_m2} (ref >=60)
Globulin: 2.5 g/dL (calc) (ref 1.9–3.7)
Glucose, Bld: 155 mg/dL — ABNORMAL HIGH (ref 65–99)
Potassium: 4 mmol/L (ref 3.5–5.3)
Sodium: 142 mmol/L (ref 135–146)
Total Bilirubin: 0.4 mg/dL (ref 0.2–1.2)
Total Protein: 6.4 g/dL (ref 6.1–8.1)

## 2019-11-22 ENCOUNTER — Telehealth: Payer: Self-pay

## 2019-11-22 NOTE — Telephone Encounter (Signed)
Pt called back in regards to missed call  

## 2019-11-22 NOTE — Telephone Encounter (Signed)
I called pt. No answer, left a message asking pt to call me back.   

## 2019-11-22 NOTE — Telephone Encounter (Signed)
-----   Message from Star Age, MD sent at 11/20/2019  8:17 AM EDT ----- These call patient regarding the recent nuclear medicine DaT scan result. As discussed, this is a specialized brain scan designed to help with diagnosis of tremor disorders, including parkinsonian disorders. A radioactive marker gets injected and the uptake is measured in the brain and compared to normal controls and right side is compared to the left. A change in uptake can help with diagnosis of certain tremor disorders and narrow down the diagnostic possibilities. The patient's recent scan indicated abnormal (as in lower) uptake as compared to normal uptake pattern indicating an underlying parkinsonian disorder including Parkinson's disease. Again, this is not a definitive test for Parkinson's disease and does not distinguish between Parkinson's disease and other, atypical parkinsonism disorders, but is a helpful tool in diagnosis. We had talked about potentially utilizing medication for Parkinson's disease at our appointment.  He has a follow-up appointment pending for next month, about a month from now, if he wishes, please offer him a sooner appointment, otherwise he is welcome to keep next months appointment so we can discuss further.

## 2019-11-22 NOTE — Telephone Encounter (Signed)
Pt has called Megan back, please call

## 2019-11-22 NOTE — Progress Notes (Signed)
Glucose is elevated-155.  Rest of CMP WNL.  CBC WNL.

## 2019-11-23 NOTE — Telephone Encounter (Signed)
I contacted the pt and we discussed his recent dat scan. Pt was advised we would like for him to come in for f.u sooner to go over the result and next steps. Pt was agreeable and has been scheduled for 11/29/2019.

## 2019-11-23 NOTE — Telephone Encounter (Signed)
Pt returned call. Please call back when available. 

## 2019-11-23 NOTE — Telephone Encounter (Signed)
I called pt. No answer, left a message asking pt to call me back.   
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
## 2019-11-29 ENCOUNTER — Other Ambulatory Visit: Payer: Self-pay

## 2019-11-29 ENCOUNTER — Encounter: Payer: Self-pay | Admitting: Neurology

## 2019-11-29 ENCOUNTER — Ambulatory Visit: Payer: Medicare HMO | Admitting: Neurology

## 2019-11-29 VITALS — BP 132/70 | HR 69 | Ht 72.0 in | Wt 236.0 lb

## 2019-11-29 DIAGNOSIS — G20 Parkinson's disease: Secondary | ICD-10-CM | POA: Diagnosis not present

## 2019-11-29 DIAGNOSIS — R42 Dizziness and giddiness: Secondary | ICD-10-CM | POA: Diagnosis not present

## 2019-11-29 MED ORDER — CARBIDOPA-LEVODOPA 25-100 MG PO TABS: ORAL_TABLET | ORAL | 3 refills | Status: DC

## 2019-11-29 NOTE — Patient Instructions (Signed)
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  I would like for you to start a medication called Sinemet (generic name: carbidopa-levodopa) 25/100 mg: Take half a pill twice daily (8 AM and noon) for one week, then half a pill 3 times a day (8 AM, noon, and 4 PM) for one week, then one pill 3 times a day thereafter. Please try to take the medication away from you mealtimes, that is, ideally either one hour before or 2 hours after your meal to ensure optimal absorption. The medication can interfere with the protein content of your meal and trying to the protein in your food and therefore not get fully absorbed.  Common side effects reported are: Nausea, vomiting, sedation, confusion, lightheadedness. Rare side effects include hallucinations, severe nausea or vomiting, diarrhea and significant drop in blood pressure especially when going from lying to standing or from sitting to standing.  For now, I will send a prescription to your retail pharmacy and when we changed to a longer term prescription we will send the prescription to your mail order pharmacy.  Please follow-up in 3 months.  Please be mindful about constipation and proactive about preventing constipation issues.  Please try to hydrate well with water, be careful when you first stand up and get your bearings first.
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
## 2019-12-08 ENCOUNTER — Encounter: Payer: Self-pay | Admitting: Rheumatology

## 2019-12-08 ENCOUNTER — Ambulatory Visit: Payer: Medicare HMO | Admitting: Rheumatology

## 2019-12-08 ENCOUNTER — Other Ambulatory Visit: Payer: Self-pay

## 2019-12-08 VITALS — BP 152/80 | HR 64 | Resp 16 | Ht 75.0 in | Wt 237.0 lb

## 2019-12-08 DIAGNOSIS — M5136 Other intervertebral disc degeneration, lumbar region: Secondary | ICD-10-CM

## 2019-12-08 DIAGNOSIS — R519 Headache, unspecified: Secondary | ICD-10-CM

## 2019-12-08 DIAGNOSIS — Z79899 Other long term (current) drug therapy: Secondary | ICD-10-CM | POA: Diagnosis not present

## 2019-12-08 DIAGNOSIS — M19042 Primary osteoarthritis, left hand: Secondary | ICD-10-CM

## 2019-12-08 DIAGNOSIS — Z8546 Personal history of malignant neoplasm of prostate: Secondary | ICD-10-CM

## 2019-12-08 DIAGNOSIS — M19071 Primary osteoarthritis, right ankle and foot: Secondary | ICD-10-CM | POA: Diagnosis not present

## 2019-12-08 DIAGNOSIS — R42 Dizziness and giddiness: Secondary | ICD-10-CM

## 2019-12-08 DIAGNOSIS — Z8639 Personal history of other endocrine, nutritional and metabolic disease: Secondary | ICD-10-CM

## 2019-12-08 DIAGNOSIS — Z87448 Personal history of other diseases of urinary system: Secondary | ICD-10-CM

## 2019-12-08 DIAGNOSIS — G252 Other specified forms of tremor: Secondary | ICD-10-CM

## 2019-12-08 DIAGNOSIS — Z8679 Personal history of other diseases of the circulatory system: Secondary | ICD-10-CM

## 2019-12-08 DIAGNOSIS — M19041 Primary osteoarthritis, right hand: Secondary | ICD-10-CM

## 2019-12-08 DIAGNOSIS — G8929 Other chronic pain: Secondary | ICD-10-CM

## 2019-12-08 DIAGNOSIS — M0579 Rheumatoid arthritis with rheumatoid factor of multiple sites without organ or systems involvement: Secondary | ICD-10-CM | POA: Diagnosis not present

## 2019-12-08 DIAGNOSIS — M19072 Primary osteoarthritis, left ankle and foot: Secondary | ICD-10-CM

## 2019-12-08 DIAGNOSIS — M5030 Other cervical disc degeneration, unspecified cervical region: Secondary | ICD-10-CM

## 2019-12-08 DIAGNOSIS — M51369 Other intervertebral disc degeneration, lumbar region without mention of lumbar back pain or lower extremity pain: Secondary | ICD-10-CM

## 2019-12-08 NOTE — Patient Instructions (Signed)
Standing Labs We placed an order today for your standing lab work.    Please come back and get your standing labs in August and every 3 months   We have open lab daily Monday through Thursday from 8:30-12:30 PM and 1:30-4:30 PM and Friday from 8:30-12:30 PM and 1:30-4:00 PM at the office of Dr. Shaili Deveshwar.   You may experience shorter wait times on Monday and Friday afternoons. The office is located at [address], [address], [place], [place] [address] No appointment is necessary.   Labs are drawn by [facility].  You may receive a bill from [facility] for your lab work.  If you wish to have your labs drawn at another location, please call the office 24 hours in advance to send orders.  If you have any questions regarding directions or hours of operation,  please call [phone].   Just as a reminder please drink plenty of water prior to coming for your lab work. Thanks!   

## 2019-12-13 ENCOUNTER — Encounter: Payer: Medicare HMO | Admitting: Family Medicine

## 2019-12-13 ENCOUNTER — Ambulatory Visit: Payer: Medicare HMO | Admitting: Family Medicine

## 2019-12-14 ENCOUNTER — Other Ambulatory Visit: Payer: Self-pay

## 2019-12-14 ENCOUNTER — Ambulatory Visit (INDEPENDENT_AMBULATORY_CARE_PROVIDER_SITE_OTHER): Payer: Medicare HMO | Admitting: Family Medicine

## 2019-12-14 ENCOUNTER — Encounter: Payer: Self-pay | Admitting: Family Medicine

## 2019-12-14 VITALS — BP 110/60 | HR 68 | Ht 75.0 in | Wt 238.4 lb

## 2019-12-14 DIAGNOSIS — E119 Type 2 diabetes mellitus without complications: Secondary | ICD-10-CM | POA: Diagnosis not present

## 2019-12-14 DIAGNOSIS — G20 Parkinson's disease: Secondary | ICD-10-CM

## 2019-12-14 DIAGNOSIS — M0579 Rheumatoid arthritis with rheumatoid factor of multiple sites without organ or systems involvement: Secondary | ICD-10-CM | POA: Diagnosis not present

## 2019-12-14 DIAGNOSIS — G20A1 Parkinson's disease without dyskinesia, without mention of fluctuations: Secondary | ICD-10-CM | POA: Insufficient documentation

## 2019-12-14 DIAGNOSIS — I10 Essential (primary) hypertension: Secondary | ICD-10-CM | POA: Diagnosis not present

## 2019-12-14 LAB — POCT GLYCOSYLATED HEMOGLOBIN (HGB A1C): HbA1c, POC (controlled diabetic range): 6.7 % (ref 0.0–7.0)

## 2019-12-14 MED ORDER — AMLODIPINE BESYLATE 5 MG PO TABS: 5.0000 mg | ORAL_TABLET | Freq: Every day | ORAL | 3 refills | Status: DC

## 2019-12-14 NOTE — Assessment & Plan Note (Signed)
Patient's A1c was 6.4 today.  This is a great improvement since his last recorded value a year ago.  This is most likely due to the addition of Invokana.  And the patient's compliance with his medication.  Congratulated patient on his adherence and A1c level.  Performed a diabetic foot exam which was normal.

## 2019-12-14 NOTE — Assessment & Plan Note (Signed)
Patient diagnosed with Parkinson's by neurology.  Head imaging was supportive of Parkinson's diagnosis.  He was prescribed Sinemet for symptomatic relief at his last visit but did not pick up the prescription I confirmed with the patient that the imaging and the neurologist note suggested Parkinson's disease.  I talked with him about risks and benefits of taking the medication and how it offers symptomatic relief.  He stated he will pick up the medication when he leaves to get his other medication today.

## 2019-12-14 NOTE — Assessment & Plan Note (Signed)
Patient reports compliance with his medication and minimal symptoms.  Treatment per rheumatology

## 2019-12-14 NOTE — Assessment & Plan Note (Signed)
Patient was started Coreg twice daily while in the hospital last year for syncopal episode.  He does not have a history of heart disease.  No indication for continuation of carvedilol over other antihypertensive medications.  Patient on the lowest dose twice a day, no need to titrate off.  Advised patient he can stop today and take his amlodipine tonight.  Prescribed amlodipine 5 mg nightly.  Advised patient to follow-up in 3 to 4 weeks for medication titration

## 2019-12-14 NOTE — Progress Notes (Signed)
    SUBJECTIVE:   CHIEF COMPLAINT / HPI:   Diabetes: Patient has been compliant with his medications.  Has been taking the Invokana.  RA: Patient takes his methotrexate every Monday and Tuesday.  He takes his folic acid daily.  He feels that his arthritis in his hands is very well controlled.  It is not an issue for him.    Parkinsons: Patient has not picked up his medications that he was prescribed during his last neurology visit because he states the neurologist told him that she did not see any signs of Parkinson's disease on his imaging, but was treating him based on his symptoms.  I went over with the patient the neurologist note stating that she says imaging was consistent with Parkinson's disease.  I also showed him the radiologist report saying the same thing.  Patient is concerned about side effects including nausea.  Patient eventually said he will pick up the medication and try it.  Hypertension: Patient was wondering why he was prescribed carvedilol still.  He started this when he was in the hospital a year ago.  Prior to that he was taking lisinopril.  He has no allergy to any blood pressure medication.  He has not tried amlodipine before.  Bph: has appt with urology on July 27th.  He has not had issues with his BPH while on the Flomax.   PERTINENT  PMH / PSH: Rheumatoid arthritis, osteoarthritis, Parkinson's disease, diabetes  OBJECTIVE:   BP 110/60   Pulse 68   Ht 6\' 3"  (1.905 m)   Wt 238 lb 6 oz (108.1 kg)   SpO2 99%   BMI 29.79 kg/m   General: Alert.  No acute distress. CV: Regular rate and rhythm. MSK: No resting tremor noted.  Normal gait, albeit with slow rise from sitting position.  ASSESSMENT/PLAN:   T2DM (type 2 diabetes mellitus) ([place]) Patient's A1c was 6.4 today.  This is a great improvement since his last recorded value a year ago.  This is most likely due to the addition of Invokana.  And the patient's compliance with his medication.  Congratulated  patient on his adherence and A1c level.  Performed a diabetic foot exam which was normal.  Rheumatoid arthritis ([place]) Patient reports compliance with his medication and minimal symptoms.  Treatment per rheumatology  Parkinson's disease [facility]) Patient diagnosed with Parkinson's by neurology.  Head imaging was supportive of Parkinson's diagnosis.  He was prescribed Sinemet for symptomatic relief at his last visit but did not pick up the prescription I confirmed with the patient that the imaging and the neurologist note suggested Parkinson's disease.  I talked with him about risks and benefits of taking the medication and how it offers symptomatic relief.  He stated he will pick up the medication when he leaves to get his other medication today.  Essential hypertension, benign Patient was started Coreg twice daily while in the hospital last year for syncopal episode.  He does not have a history of heart disease.  No indication for continuation of carvedilol over other antihypertensive medications.  Patient on the lowest dose twice a day, no need to titrate off.  Advised patient he can stop today and take his amlodipine tonight.  Prescribed amlodipine 5 mg nightly.  Advised patient to follow-up in 3 to 4 weeks for medication titration     Benay Pike, MD [place]

## 2019-12-14 NOTE — Patient Instructions (Addendum)
It was nice to see you again today,  Great job on improving your blood sugar readings.  Your A1c is excellent today.  We made the following changes to your blood pressure medication:  -Stop taking her carvedilol today.  The dose is low enough that you do not need to taper off of it. -Start amlodipine this evening.  Take it at night before bedtime.  Take it once a day.  Please see me back in 1 month so we can titrate up your blood pressure medication as necessary.  Have a great day,  Clemetine Marker, MD

## 2019-12-26 ENCOUNTER — Ambulatory Visit: Payer: Medicare HMO | Admitting: Neurology

## 2020-01-16 NOTE — Progress Notes (Signed)
    SUBJECTIVE:   CHIEF COMPLAINT / HPI:   HTN: Patient has been taking his amlodipine daily.  Has not noticed any leg swelling.  He states at home his blood pressure is usually in the 130s over 70s.    Finger swelling: Patient states he fell on 6/11.  He tripped over the last few stairs in his home and hurt his left ring finger.  It had some swelling but he thought it would improve.  He is still having some swelling but the distal part of it is also bent.  He is able to make a fist.  It is slightly tender to palpation on the distal end.    PERTINENT  PMH / PSH: Parkinson's, hypertension  OBJECTIVE:   BP 140/72   Pulse 79   Wt 237 lb 3.2 oz (107.6 kg)   SpO2 98%   BMI 29.65 kg/m   Gen: alert. Oriented.  No acute distress. Elderly male, appears stated age.  CV: RRR. No murmurs Pulm: LCTAB Ext: no LE edema.  Left ring finger is flexed at the DIP joint.  Unable to fully extend.  Can make full fist.  Normal grip strenght b/l.   ASSESSMENT/PLAN:   Essential hypertension, benign BP was slightly elevated on initial reading.  Was 140/72 on reread.  Reported BP at home has been in normal range.  Likely some degree of white coat htn.  incrased his amlodipine to 10mg .  Recheck in 2-3 weeks.   Mallet finger of left hand His injury occurred several weeks ago and therefore it is unlikely that splinting will be of any benefit.  Would need surgery for any chance of repair but pt agrees this is unnecessary.  He has simliar issue on the pinky of his opposite hand that was never repaired. Given the continued swelling I will get an xray of his hand to evaluate for fracture.       Benay Pike, MD [place]

## 2020-01-17 ENCOUNTER — Encounter: Payer: Self-pay | Admitting: Family Medicine

## 2020-01-17 ENCOUNTER — Other Ambulatory Visit: Payer: Self-pay

## 2020-01-17 ENCOUNTER — Ambulatory Visit (INDEPENDENT_AMBULATORY_CARE_PROVIDER_SITE_OTHER): Payer: Medicare HMO | Admitting: Family Medicine

## 2020-01-17 VITALS — BP 140/72 | HR 79 | Wt 237.2 lb

## 2020-01-17 DIAGNOSIS — I10 Essential (primary) hypertension: Secondary | ICD-10-CM | POA: Diagnosis not present

## 2020-01-17 DIAGNOSIS — M20012 Mallet finger of left finger(s): Secondary | ICD-10-CM

## 2020-01-17 MED ORDER — AMLODIPINE BESYLATE 10 MG PO TABS: 10.0000 mg | ORAL_TABLET | Freq: Every day | ORAL | 1 refills | Status: DC

## 2020-01-17 NOTE — Patient Instructions (Signed)
I have increased your amlodipine to 10mg .  Please continue to take your BP at home.    The injury to your finger has caused something called 'mallet finger'. This is a rupture of the tendon. At this point it is unlikely to heal.  The swelling should improve over time but the end of your finger may always have a bend in it.  We will get an Xray of your finger to make sure it's not broken.    Mallet Finger  Mallet finger is an injury that occurs when an object hits the tip of your straightened finger or thumb. It is also known as baseball finger. The blow to your fingertip causes it to bend more than normal, which tears the cord that attaches to the tip of your finger (extensor tendon).  Your extensor tendon is what straightens the end of your finger. If this tendon is damaged, you will not be able to straighten your fingertip. Sometimes, a piece of bone may be pulled away with the tendon (avulsion injury), or the tendon may tear completely. In some cases, surgery may be required to repair the damage. What are the causes? Mallet finger is caused by a hard, direct hit to the tip of your finger or thumb. This injury often happens from getting hit in the finger with a hard ball, such as a baseball. What increases the risk? This injury is more likely to happen if you play a sport that uses a hard ball. What are the signs or symptoms? The main symptom of this injury is the inability to straighten the tip of your finger. You can manually straighten your fingertip with your other hand, but the finger cannot straighten on its own. Other symptoms may include:  Pain.  Swelling.  Bruising.  Blood under the fingernail. How is this diagnosed? Your health care provider may suspect mallet finger if you are not able to extend your fingertip, especially if you recently injured your hand. Your health care provider will do a physical exam. This may include X-rays to see if a piece of bone has been pulled away or  if the finger joint has separated (dislocated). How is this treated? Mallet finger may be treated with:  A splint on your fingertip to keep it straight (extended) while the tendon heals.  Surgery to repair the tendon. This is done in severe cases. This may involve: ? Using a pin or screw to keep your finger extended and your tendon attached. ? Using a piece of tendon from another part of your body (graft) to replace a torn tendon. Follow these instructions at home: If you have a splint:  Wear the splint as told by your health care provider. Remove it only as told by your health care provider.  Loosen the splint if your fingers tingle, become numb, or turn cold and blue.  Keep the splint clean.  If the splint is not waterproof: ? Do not let it get wet. ? Cover it with a watertight covering when you take a bath or a shower.  If you take your splint off to dry it or change it: ? Gently press your finger on a flat surface to keep it straight. Failing to do so may lead to a permanent injury, or force you to wear the splint for a longer period of time. ? Check the skin under the splint. Tell your health care provider if you notice a blister or red and raw skin. Managing pain, stiffness, and swelling  If directed, put ice on the injured area: ? If you have a removable splint, remove it as told by your health care provider. ? Put ice in a plastic bag. ? Place a towel between your skin and the bag. ? Leave the ice on for 20 minutes, 2-3 times a day.  Move your fingers often to avoid stiffness and to lessen swelling.  Raise (elevate)the injured hand above the level of your heart while you are sitting or lying down. General instructions  Take over-the-counter and prescription medicines only as told by your health care provider.  Do not drive or use heavy machinery while taking prescription pain medicine.  Keep all follow-up visits as told by your health care provider. This is  important. Contact a health care provider if:  You have pain or swelling that is getting worse.  Your finger feels cold.  You cannot extend your finger after treatment.  You notice that the skin under the splint is red, raw, or has a blister. Get help right away if:  Even after loosening your splint, your finger is: ? Very red and swollen. ? White or blue. ? Numb or tingling. Summary  Mallet finger is an injury that occurs from a hard, direct hit to the tip of your finger or thumb.  The blow to your fingertip causes it to bend more than normal, tearing the tendon that straightens the end of your finger. You cannot straighten your fingertip if this tendon is torn.  This injury often happens from getting hit in the finger with a hard ball, such as a baseball.  Treatment will depend on how severe the injury is. You may need to wear a splint to keep the finger straight while it heals. A more severe injury may require surgery to repair the tendon. This information is not intended to replace advice given to you by your health care provider. Make sure you discuss any questions you have with your health care provider. Document Revised: 07/05/2017 Document Reviewed: 07/05/2017 Elsevier Patient Education  2020 [employer].

## 2020-01-20 DIAGNOSIS — M20012 Mallet finger of left finger(s): Secondary | ICD-10-CM | POA: Insufficient documentation

## 2020-01-20 NOTE — Assessment & Plan Note (Signed)
BP was slightly elevated on initial reading.  Was 140/72 on reread.  Reported BP at home has been in normal range.  Likely some degree of white coat htn.  incrased his amlodipine to 10mg .  Recheck in 2-3 weeks.

## 2020-01-20 NOTE — Assessment & Plan Note (Addendum)
His injury occurred several weeks ago and therefore it is unlikely that splinting will be of any benefit.  Would need surgery for any chance of repair but pt agrees this is unnecessary.  He has simliar issue on the pinky of his opposite hand that was never repaired. Given the continued swelling I will get an xray of his hand to evaluate for fracture.

## 2020-01-25 ENCOUNTER — Telehealth: Payer: Self-pay

## 2020-01-25 NOTE — Telephone Encounter (Signed)
Pt called wanting to confirm what his dosage of the Sinemet 25/100 mg should be. Pt reports he has successfully tapered up to 1 pill tid.  Pt will continue at this dosage for now.

## 2020-02-29 ENCOUNTER — Encounter: Payer: Self-pay | Admitting: Neurology

## 2020-02-29 ENCOUNTER — Ambulatory Visit: Payer: Medicare HMO | Admitting: Neurology

## 2020-02-29 VITALS — BP 142/70 | HR 75 | Ht 75.0 in | Wt 237.0 lb

## 2020-02-29 DIAGNOSIS — R42 Dizziness and giddiness: Secondary | ICD-10-CM

## 2020-02-29 DIAGNOSIS — G20 Parkinson's disease: Secondary | ICD-10-CM

## 2020-02-29 DIAGNOSIS — K5909 Other constipation: Secondary | ICD-10-CM | POA: Diagnosis not present

## 2020-02-29 DIAGNOSIS — Z9181 History of falling: Secondary | ICD-10-CM

## 2020-02-29 DIAGNOSIS — W19XXXS Unspecified fall, sequela: Secondary | ICD-10-CM

## 2020-02-29 MED ORDER — CARBIDOPA-LEVODOPA 25-100 MG PO TABS: 1.0000 | ORAL_TABLET | Freq: Three times a day (TID) | ORAL | 3 refills | Status: DC
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
## 2020-02-29 NOTE — Patient Instructions (Addendum)
Your exam is stable. Please continue with the Sinemet.  Please monitor for constipation issues, try to hydrate better with water, 6 to 8 cups of water per day are recommended, 8 ounces each or about 3-4 bottles, 16.9 ounce each. For your left ring finger injury, I would like to proceed with a x-ray.  We will call you with the results once they are available and if need be I can make a referral to hand surgeon. Please try to exercise maybe in the form of walking, about 15 to 20 minutes/day, weather permitting.  Follow-up in 6 months, sooner if needed.

## 2020-03-01 ENCOUNTER — Encounter: Payer: Self-pay | Admitting: Family Medicine

## 2020-03-01 ENCOUNTER — Other Ambulatory Visit: Payer: Self-pay

## 2020-03-01 ENCOUNTER — Ambulatory Visit (INDEPENDENT_AMBULATORY_CARE_PROVIDER_SITE_OTHER): Payer: Medicare HMO | Admitting: Family Medicine

## 2020-03-01 VITALS — BP 140/68 | HR 74 | Wt 237.0 lb

## 2020-03-01 DIAGNOSIS — L259 Unspecified contact dermatitis, unspecified cause: Secondary | ICD-10-CM | POA: Insufficient documentation

## 2020-03-01 DIAGNOSIS — L237 Allergic contact dermatitis due to plants, except food: Secondary | ICD-10-CM

## 2020-03-01 HISTORY — DX: Unspecified contact dermatitis, unspecified cause: L25.9

## 2020-03-01 MED ORDER — TRIAMCINOLONE ACETONIDE 0.025 % EX OINT: 1.0000 "application " | TOPICAL_OINTMENT | Freq: Two times a day (BID) | CUTANEOUS | 0 refills | Status: DC

## 2020-03-01 MED ORDER — FEXOFENADINE HCL 180 MG PO TABS: 180.0000 mg | ORAL_TABLET | Freq: Every day | ORAL | 0 refills | Status: DC

## 2020-03-01 NOTE — Assessment & Plan Note (Signed)
Most likely dx is contact dermatitis d/t plants, likely poison ivy/poison oak as it appeared shortly after he was clearing bushes and it is on his hand/wrist. Patient opted to try triamcinolone oint as OTC hydrocortisone had not worked. Precepted with Dr. Nori Riis. With age and dx of parkinson's, will avoid BEER's list drugs such as benadryl for itching. - allegra daily for itching - triamcinolone oint BID x 2 weeks for improvement - return precautions for spread or signs of infection given

## 2020-03-01 NOTE — Progress Notes (Signed)
    SUBJECTIVE:   CHIEF COMPLAINT / HPI: rash on hand  Patient was clearing some bushes behind his house about a week ago and noticed that an itchy rash appeared on the dorsal aspect of his wrist shortly thereafter. He has tried OTC hydrocortisone cream without improvement. He only has the rash on that wrist and hand. No other known contacts with irritants, no change in personal products. Denies fevers, chills, swelling, redness, pustlant drainage.  PERTINENT  PMH / PSH: parkinson's disease  OBJECTIVE:   BP 140/68   Pulse 74   Wt 237 lb (107.5 kg)   BMI 29.62 kg/m   Physical Exam Vitals and nursing note reviewed.  Constitutional:      General: He is not in acute distress.    Appearance: Normal appearance. He is not ill-appearing, toxic-appearing or diaphoretic.  Skin:    General: Skin is warm and dry.     Findings: Rash present.     Comments: Circular area on right dorsum of wrist with many small vesicles  Neurological:     General: No focal deficit present.     Mental Status: He is alert. Mental status is at baseline.  Psychiatric:        Mood and Affect: Mood normal.        Behavior: Behavior normal.     ASSESSMENT/PLAN:   Contact dermatitis Most likely dx is contact dermatitis d/t plants, likely poison ivy/poison oak as it appeared shortly after he was clearing bushes and it is on his hand/wrist. Patient opted to try triamcinolone oint as OTC hydrocortisone had not worked. Precepted with Dr. Nori Riis. With age and dx of parkinson's, will avoid BEER's list drugs such as benadryl for itching. - allegra daily for itching - triamcinolone oint BID x 2 weeks for improvement - return precautions for spread or signs of infection given     Gladys Damme, MD [place]

## 2020-03-01 NOTE — Patient Instructions (Signed)
It was a pleasure to see you today!  You have something called contact dermatitis, likely due to poison ivy or poison oak, that you got when you were clearing some bushes.  Take allegra once a day to help with itch and use triamcinolone ointment 2x per day for 2 weeks. This will get better, but in the meantime try to avoid scratching it. Be sure to wash your hands after touching the areas as it can spread. If you have a fever, increased swelling or drainage that looks like pus, give [place] a call to schedule a follow up visit.  Feel better!  Dr. Chauncey Reading  Contact Dermatitis Dermatitis is redness, soreness, and swelling (inflammation) of the skin. Contact dermatitis is a reaction to something that touches the skin. There are two types of contact dermatitis:  Irritant contact dermatitis. This happens when something bothers (irritates) your skin, like soap.  Allergic contact dermatitis. This is caused when you are exposed to something that you are allergic to, such as poison ivy. What are the causes?  Common causes of irritant contact dermatitis include: ? Makeup. ? Soaps. ? Detergents. ? Bleaches. ? Acids. ? Metals, such as nickel.  Common causes of allergic contact dermatitis include: ? Plants. ? Chemicals. ? Jewelry. ? Latex. ? Medicines. ? Preservatives in products, such as clothing. What increases the risk?  Having a job that exposes you to things that bother your skin.  Having asthma or eczema. What are the signs or symptoms? Symptoms may happen anywhere the irritant has touched your skin. Symptoms include:  Dry or flaky skin.  Redness.  Cracks.  Itching.  Pain or a burning feeling.  Blisters.  Blood or clear fluid draining from skin cracks. With allergic contact dermatitis, swelling may occur. This may happen in places such as the eyelids, mouth, or genitals. How is this treated?  This condition is treated by checking for the cause of the reaction and  protecting your skin. Treatment may also include: ? Steroid creams, ointments, or medicines. ? Antibiotic medicines or other ointments, if you have a skin infection. ? Lotion or medicines to help with itching. ? A bandage (dressing). Follow these instructions at home: Skin care  Moisturize your skin as needed.  Put cool cloths on your skin.  Put a baking soda paste on your skin. Stir water into baking soda until it looks like a paste.  Do not scratch your skin.  Avoid having things rub up against your skin.  Avoid the use of soaps, perfumes, and dyes. Medicines  Take or apply over-the-counter and prescription medicines only as told by your doctor.  If you were prescribed an antibiotic medicine, take or apply it as told by your doctor. Do not stop using it even if your condition starts to get better. Bathing  Take a bath with: ? Epsom salts. ? Baking soda. ? Colloidal oatmeal.  Bathe less often.  Bathe in warm water. Avoid using hot water. Bandage care  If you were given a bandage, change it as told by your health care provider.  Wash your hands with soap and water before and after you change your bandage. If soap and water are not available, use hand sanitizer. General instructions  Avoid the things that caused your reaction. If you do not know what caused it, keep a journal. Write down: ? What you eat. ? What skin products you use. ? What you drink. ? What you wear in the area that has symptoms. This includes jewelry.  Check the affected areas every day for signs of infection. Check for: ? More redness, swelling, or pain. ? More fluid or blood. ? Warmth. ? Pus or a bad smell.  Keep all follow-up visits as told by your doctor. This is important. Contact a doctor if:  You do not get better with treatment.  Your condition gets worse.  You have signs of infection, such as: ? More swelling. ? Tenderness. ? More redness. ? Soreness. ? Warmth.  You have a  fever.  You have new symptoms. Get help right away if:  You have a very bad headache.  You have neck pain.  Your neck is stiff.  You throw up (vomit).  You feel very sleepy.  You see red streaks coming from the area.  Your bone or joint near the area hurts after the skin has healed.  The area turns darker.  You have trouble breathing. Summary  Dermatitis is redness, soreness, and swelling of the skin.  Symptoms may occur where the irritant has touched you.  Treatment may include medicines and skin care.  If you do not know what caused your reaction, keep a journal.  Contact a doctor if your condition gets worse or you have signs of infection. This information is not intended to replace advice given to you by your health care provider. Make sure you discuss any questions you have with your health care provider. Document Revised: 10/12/2018 Document Reviewed: 01/05/2018 Elsevier Patient Education  [place].

## 2020-03-04 ENCOUNTER — Other Ambulatory Visit: Payer: Self-pay | Admitting: Rheumatology

## 2020-03-04 MED ORDER — METHOTREXATE 2.5 MG PO TABS: 20.0000 mg | ORAL_TABLET | ORAL | 0 refills | Status: DC

## 2020-03-04 NOTE — Telephone Encounter (Signed)
Last Visit: 12/08/2019 Next Visit: 05/10/2020 Labs: 11/21/2019 Glucose is elevated-155. Rest of CMP WNL. CBC WNL.   Current Dose per office note 12/08/2019: Methotrexate 2.5 mg 4 tablets twice a week on Mondays and Tuesdays only  DX: Rheumatoid arthritis involving multiple sites with positive rheumatoid factor   Patient advised he is due to update labs.   Okay to refill 30 day supply MTX?

## 2020-03-04 NOTE — Telephone Encounter (Signed)
Patient called requesting prescription refill of Methotrexate to be sent to [place] at [facility].  Patient states he will be in the office tomorrow morning 03/05/20 for labwork.

## 2020-03-05 ENCOUNTER — Other Ambulatory Visit: Payer: Self-pay

## 2020-03-05 DIAGNOSIS — Z79899 Other long term (current) drug therapy: Secondary | ICD-10-CM

## 2020-03-06 LAB — CBC WITH DIFFERENTIAL/PLATELET
Absolute Monocytes: 602 cells/uL (ref 200–950)
Basophils Absolute: 28 cells/uL (ref 0–200)
Basophils Relative: 0.4 %
Eosinophils Absolute: 70 cells/uL (ref 15–500)
Eosinophils Relative: 1 %
HCT: 39.3 % (ref 38.5–50.0)
Hemoglobin: 12.8 g/dL — ABNORMAL LOW (ref 13.2–17.1)
Lymphs Abs: 1491 cells/uL (ref 850–3900)
MCH: 28.9 pg (ref 27.0–33.0)
MCHC: 32.6 g/dL (ref 32.0–36.0)
MCV: 88.7 fL (ref 80.0–100.0)
MPV: 11.3 fL (ref 7.5–12.5)
Monocytes Relative: 8.6 %
Neutro Abs: 4809 cells/uL (ref 1500–7800)
Neutrophils Relative %: 68.7 %
Platelets: 218 10*3/uL (ref 140–400)
RBC: 4.43 10*6/uL (ref 4.20–5.80)
RDW: 14.4 % (ref 11.0–15.0)
Total Lymphocyte: 21.3 %
WBC: 7 10*3/uL (ref 3.8–10.8)

## 2020-03-06 LAB — COMPLETE METABOLIC PANEL WITH GFR
AG Ratio: 1.5 (calc) (ref 1.0–2.5)
ALT: 9 U/L (ref 9–46)
AST: 11 U/L (ref 10–35)
Albumin: 3.8 g/dL (ref 3.6–5.1)
Alkaline phosphatase (APISO): 55 U/L (ref 35–144)
BUN: 12 mg/dL (ref 7–25)
CO2: 27 mmol/L (ref 20–32)
Calcium: 9.3 mg/dL (ref 8.6–10.3)
Chloride: 107 mmol/L (ref 98–110)
Creat: 0.95 mg/dL (ref 0.70–1.18)
GFR, Est African American: 94 mL/min/{1.73_m2} (ref >=60)
GFR, Est Non African American: 81 mL/min/{1.73_m2} (ref >=60)
Globulin: 2.5 g/dL (calc) (ref 1.9–3.7)
Glucose, Bld: 84 mg/dL (ref 65–99)
Potassium: 4.2 mmol/L (ref 3.5–5.3)
Sodium: 142 mmol/L (ref 135–146)
Total Bilirubin: 0.4 mg/dL (ref 0.2–1.2)
Total Protein: 6.3 g/dL (ref 6.1–8.1)

## 2020-03-06 NOTE — Progress Notes (Signed)
CMP WNL.  Hgb 12.8.  Hct WNL.  Rest of CBC WNL.

## 2020-03-15 ENCOUNTER — Other Ambulatory Visit: Payer: Self-pay

## 2020-03-15 DIAGNOSIS — E785 Hyperlipidemia, unspecified: Secondary | ICD-10-CM

## 2020-03-15 DIAGNOSIS — Z8546 Personal history of malignant neoplasm of prostate: Secondary | ICD-10-CM

## 2020-03-15 DIAGNOSIS — E118 Type 2 diabetes mellitus with unspecified complications: Secondary | ICD-10-CM

## 2020-03-15 NOTE — Telephone Encounter (Signed)
Received message from front desk staff that patient is requesting medication refills on all medications. Patient states that pharmacy has been faxing office this request.   Advised patient to have pharmacy request needed medications electronically.   Will await response from pharmacy.   Talbot Grumbling, RN

## 2020-03-18 NOTE — Telephone Encounter (Signed)
I haven't seen any new refill requests yet since the patient called.  Can we call the pharmacy directly to ask them if they have been tasking for refills?

## 2020-03-20 NOTE — Telephone Encounter (Signed)
Received VM from patient to discuss rx refills. Attempted to call patient back, however, patient did not answer. Left another HIPPA compliant VM for patient to return call to office to discuss.   Talbot Grumbling, RN

## 2020-03-20 NOTE — Telephone Encounter (Signed)
[place]. Patient has been receiving refills from Fredrich Romans, [place] at [facility]. Pharmacy Tech was unable to provide which refills patient was inquiring about.   Attempted to call patient. No answer, left HIPPA compliant VM for patient to return call to discuss prescriptions.   Talbot Grumbling, RN

## 2020-03-25 NOTE — Telephone Encounter (Signed)
Can we try calling him one more time?

## 2020-03-26 NOTE — Telephone Encounter (Signed)
Called patient and discussed medications. Patient states that he is needing the following medication refilled.  - metformin 1000 mg -tamsulosin 0.4 mg - atorvastatin 40 mg - Invokana 100 mg - omeprazole - Not on medication list, please advise if refill is appropriate.   *All other medication refills have been pended to this encounter.   Talbot Grumbling, RN

## 2020-03-27 MED ORDER — METFORMIN HCL 1000 MG PO TABS: ORAL_TABLET | ORAL | 3 refills | Status: DC

## 2020-03-27 MED ORDER — CANAGLIFLOZIN 100 MG PO TABS: 100.0000 mg | ORAL_TABLET | Freq: Every day | ORAL | 1 refills | Status: DC

## 2020-03-27 MED ORDER — ATORVASTATIN CALCIUM 40 MG PO TABS: 40.0000 mg | ORAL_TABLET | Freq: Every day | ORAL | 3 refills | Status: DC

## 2020-03-27 MED ORDER — TAMSULOSIN HCL 0.4 MG PO CAPS: 0.4000 mg | ORAL_CAPSULE | Freq: Every day | ORAL | 3 refills | Status: DC

## 2020-04-02 ENCOUNTER — Telehealth: Payer: Self-pay | Admitting: Rheumatology

## 2020-04-02 MED ORDER — METHOTREXATE 2.5 MG PO TABS: 20.0000 mg | ORAL_TABLET | ORAL | 0 refills | Status: DC

## 2020-04-02 NOTE — Telephone Encounter (Signed)
Last Visit: 12/08/2019 Next Visit: 05/10/2020 Labs: 03/05/2020 CMP WNL. Hgb 12.8. Hct WNL. Rest of CBC WNL.   Current Dose per office note 12/08/2019: Methotrexate 2.5 mg 4 tablets twice a week on Mondays and Tuesdays only  DX: Rheumatoid arthritis involving multiple sites with positive rheumatoid factor   Okay to refill per Dr. Estanislado Pandy   Left message to advise patient we had not received a refill request on MTX. Patient advised we are sending prescription to the pharmacy and e may call to set up shipment.

## 2020-04-02 NOTE — Telephone Encounter (Signed)
Patient received a letter stating [employer] has not received an rx for MTX. Patient has 4 tabs left, and is due for next dose Monday. Please call to advise.
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
## 2020-05-09 ENCOUNTER — Ambulatory Visit: Payer: Medicare HMO | Admitting: Rheumatology

## 2020-05-09 ENCOUNTER — Encounter: Payer: Self-pay | Admitting: Rheumatology

## 2020-05-09 ENCOUNTER — Other Ambulatory Visit: Payer: Self-pay

## 2020-05-09 VITALS — BP 136/79 | HR 76 | Resp 17 | Ht 75.0 in | Wt 238.8 lb

## 2020-05-09 DIAGNOSIS — G252 Other specified forms of tremor: Secondary | ICD-10-CM

## 2020-05-09 DIAGNOSIS — R42 Dizziness and giddiness: Secondary | ICD-10-CM

## 2020-05-09 DIAGNOSIS — M19071 Primary osteoarthritis, right ankle and foot: Secondary | ICD-10-CM

## 2020-05-09 DIAGNOSIS — M19072 Primary osteoarthritis, left ankle and foot: Secondary | ICD-10-CM

## 2020-05-09 DIAGNOSIS — M19041 Primary osteoarthritis, right hand: Secondary | ICD-10-CM

## 2020-05-09 DIAGNOSIS — M5136 Other intervertebral disc degeneration, lumbar region: Secondary | ICD-10-CM

## 2020-05-09 DIAGNOSIS — M0579 Rheumatoid arthritis with rheumatoid factor of multiple sites without organ or systems involvement: Secondary | ICD-10-CM

## 2020-05-09 DIAGNOSIS — G8929 Other chronic pain: Secondary | ICD-10-CM

## 2020-05-09 DIAGNOSIS — Z8546 Personal history of malignant neoplasm of prostate: Secondary | ICD-10-CM

## 2020-05-09 DIAGNOSIS — M5030 Other cervical disc degeneration, unspecified cervical region: Secondary | ICD-10-CM

## 2020-05-09 DIAGNOSIS — R519 Headache, unspecified: Secondary | ICD-10-CM

## 2020-05-09 DIAGNOSIS — Z8679 Personal history of other diseases of the circulatory system: Secondary | ICD-10-CM

## 2020-05-09 DIAGNOSIS — Z79899 Other long term (current) drug therapy: Secondary | ICD-10-CM

## 2020-05-09 DIAGNOSIS — Z87448 Personal history of other diseases of urinary system: Secondary | ICD-10-CM

## 2020-05-09 DIAGNOSIS — M19042 Primary osteoarthritis, left hand: Secondary | ICD-10-CM

## 2020-05-09 DIAGNOSIS — Z7189 Other specified counseling: Secondary | ICD-10-CM

## 2020-05-09 DIAGNOSIS — Z8639 Personal history of other endocrine, nutritional and metabolic disease: Secondary | ICD-10-CM

## 2020-05-09 NOTE — Patient Instructions (Signed)
Standing Labs We placed an order today for your standing lab work.   Please have your standing labs drawn in last week of November and every 3 months  If possible, please have your labs drawn 2 weeks prior to your appointment so that the provider can discuss your results at your appointment.  We have open lab daily Monday through Thursday from 8:30-12:30 PM and 1:30-4:30 PM and Friday from 8:30-12:30 PM and 1:30-4:00 PM at the office of Dr. Bo Merino, [facility].   Please be advised, patients with office appointments requiring lab work will take precedents over walk-in lab work.  If possible, please come for your lab work on Monday and Friday afternoons, as you may experience shorter wait times. The office is located at [address] [place], [place], [place] [address] No appointment is necessary.   Labs are drawn by [facility]. Please bring your co-pay at the time of your lab draw.  You may receive a bill from [facility] for your lab work.  If you wish to have your labs drawn at another location, please call the office 24 hours in advance to send orders.  If you have any questions regarding directions or hours of operation,  please call [phone].   As a reminder, please drink plenty of water prior to coming for your lab work. Thanks!  COVID-19 vaccine recommendations:   COVID-19 vaccine is recommended for everyone (unless you are allergic to a vaccine component), even if you are on a medication that suppresses your immune system.   If you are on Methotrexate, Cellcept (mycophenolate), Rinvoq, Morrie Sheldon, and Olumiant- hold the medication for 1 week after each vaccine. Hold Methotrexate for 2 weeks after the single dose COVID-19 vaccine.   Do not take Tylenol or any anti-inflammatory medications (NSAIDs) 24 hours prior to the COVID-19 vaccination.   There is no direct evidence about the efficacy of the COVID-19 vaccine in individuals who are on medications that suppress  the immune system.   Even if you are fully vaccinated, and you are on any medications that suppress your immune system, please continue to wear a mask, maintain at least six feet social distance and practice hand hygiene.   If you develop a COVID-19 infection, please contact your PCP or our office to determine if you need monoclonal antibody infusion.  The booster vaccine is now available for immunocompromised patients.   Please see the following web sites for updated information.   [email or web address]

## 2020-05-10 ENCOUNTER — Ambulatory Visit: Payer: Medicare HMO | Admitting: Rheumatology

## 2020-05-10 DIAGNOSIS — R42 Dizziness and giddiness: Secondary | ICD-10-CM

## 2020-05-10 DIAGNOSIS — M19071 Primary osteoarthritis, right ankle and foot: Secondary | ICD-10-CM

## 2020-05-10 DIAGNOSIS — G252 Other specified forms of tremor: Secondary | ICD-10-CM

## 2020-05-10 DIAGNOSIS — Z79899 Other long term (current) drug therapy: Secondary | ICD-10-CM

## 2020-05-10 DIAGNOSIS — M19041 Primary osteoarthritis, right hand: Secondary | ICD-10-CM

## 2020-05-10 DIAGNOSIS — Z8679 Personal history of other diseases of the circulatory system: Secondary | ICD-10-CM

## 2020-05-10 DIAGNOSIS — Z8639 Personal history of other endocrine, nutritional and metabolic disease: Secondary | ICD-10-CM

## 2020-05-10 DIAGNOSIS — Z8546 Personal history of malignant neoplasm of prostate: Secondary | ICD-10-CM

## 2020-05-10 DIAGNOSIS — Z87448 Personal history of other diseases of urinary system: Secondary | ICD-10-CM

## 2020-05-10 DIAGNOSIS — M0579 Rheumatoid arthritis with rheumatoid factor of multiple sites without organ or systems involvement: Secondary | ICD-10-CM

## 2020-05-10 DIAGNOSIS — M5030 Other cervical disc degeneration, unspecified cervical region: Secondary | ICD-10-CM

## 2020-05-10 DIAGNOSIS — M5136 Other intervertebral disc degeneration, lumbar region: Secondary | ICD-10-CM

## 2020-05-11 ENCOUNTER — Ambulatory Visit (HOSPITAL_COMMUNITY)
Admission: EM | Admit: 2020-05-11 | Discharge: 2020-05-11 | Disposition: A | Discharge status: Home / Self Care | Payer: Medicare HMO | Attending: Family Medicine | Admitting: Family Medicine

## 2020-05-11 ENCOUNTER — Encounter (HOSPITAL_COMMUNITY): Payer: Self-pay | Admitting: Family Medicine

## 2020-05-11 ENCOUNTER — Other Ambulatory Visit: Payer: Self-pay

## 2020-05-11 DIAGNOSIS — M25561 Pain in right knee: Secondary | ICD-10-CM

## 2020-05-11 MED ORDER — MELOXICAM 7.5 MG PO TABS
7.5000 mg | ORAL_TABLET | Freq: Every day | ORAL | 0 refills | Status: DC
Start: 2020-05-11 — End: 2020-11-15

## 2020-05-11 NOTE — ED Triage Notes (Signed)
PT reports RT knee pain that started 2 days ago. Pt reports he feels like there is fluid on the knee. Pt denies any injury.
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
## 2020-05-24 ENCOUNTER — Other Ambulatory Visit: Payer: Self-pay | Admitting: Rheumatology

## 2020-06-10 ENCOUNTER — Telehealth: Payer: Self-pay | Admitting: Family Medicine

## 2020-06-10 NOTE — Telephone Encounter (Signed)
Patient walked in to request refill of:  Name of Medication(s):  Tamsulosin 0.4mg  Last date of OV:  03/01/20 Pharmacy:  Mailed through [facility]  The dosage is wrong supposed to be 2 tablets a day but only got enough for one tablet a day in the last order. Only has six tablets left.     Will route refill request to Clinic RN.  Discussed with patient policy to call pharmacy for future refills.  Also, discussed refills may take up to 48 hours to approve or deny.  Creig Hines

## 2020-06-12 ENCOUNTER — Other Ambulatory Visit: Payer: Self-pay | Admitting: Family Medicine

## 2020-06-12 DIAGNOSIS — Z8546 Personal history of malignant neoplasm of prostate: Secondary | ICD-10-CM

## 2020-06-12 MED ORDER — TAMSULOSIN HCL 0.4 MG PO CAPS: 0.8000 mg | ORAL_CAPSULE | Freq: Every day | ORAL | 3 refills | Status: DC

## 2020-06-12 NOTE — Telephone Encounter (Signed)
Previous notes only mention 0.4mg  per day but if this is not working I am ok with increasing it to 0.8mg .  refill for 0.8mg  (2 pills daily) sent in to [facility] in pharmacy.

## 2020-07-09 ENCOUNTER — Telehealth: Payer: Self-pay | Admitting: Rheumatology

## 2020-07-09 ENCOUNTER — Other Ambulatory Visit: Payer: Self-pay | Admitting: Physician Assistant

## 2020-07-09 NOTE — Telephone Encounter (Signed)
Patient request refill on MTX sent to [facility] on [address]. NEW PHARMACY due to insurance change.

## 2020-07-09 NOTE — Telephone Encounter (Signed)
LMOM for patient to have labs updated prior to RX RF MTX

## 2020-07-09 NOTE — Telephone Encounter (Deleted)
Last Visit:05/09/2020 Next Visit: 10/08/2020 Labs: 03/05/2020  Current Dose per office note 11/4/42021: Methotrexate 2.5 mg 4 tablets twice a week on Mondays and Tuesdays only DX: Rheumatoid arthritis involving multiple sites with positive rheumatoid factor   Okay to refill MTX?

## 2020-07-09 NOTE — Telephone Encounter (Signed)
See rx note for details.  °

## 2020-07-12 ENCOUNTER — Other Ambulatory Visit: Payer: Self-pay | Admitting: *Deleted

## 2020-07-12 ENCOUNTER — Other Ambulatory Visit: Payer: Self-pay | Admitting: Rheumatology

## 2020-07-12 DIAGNOSIS — Z79899 Other long term (current) drug therapy: Secondary | ICD-10-CM

## 2020-07-12 LAB — COMPLETE METABOLIC PANEL WITH GFR
AG Ratio: 1.3 (calc) (ref 1.0–2.5)
ALT: 9 U/L (ref 9–46)
AST: 9 U/L — ABNORMAL LOW (ref 10–35)
Albumin: 3.9 g/dL (ref 3.6–5.1)
Alkaline phosphatase (APISO): 57 U/L (ref 35–144)
BUN: 17 mg/dL (ref 7–25)
CO2: 29 mmol/L (ref 20–32)
Calcium: 9.5 mg/dL (ref 8.6–10.3)
Chloride: 106 mmol/L (ref 98–110)
Creat: 0.97 mg/dL (ref 0.70–1.18)
GFR, Est African American: 91 mL/min/{1.73_m2} (ref >=60)
GFR, Est Non African American: 79 mL/min/{1.73_m2} (ref >=60)
Globulin: 2.9 g/dL (calc) (ref 1.9–3.7)
Glucose, Bld: 144 mg/dL — ABNORMAL HIGH (ref 65–99)
Potassium: 4.5 mmol/L (ref 3.5–5.3)
Sodium: 142 mmol/L (ref 135–146)
Total Bilirubin: 0.5 mg/dL (ref 0.2–1.2)
Total Protein: 6.8 g/dL (ref 6.1–8.1)

## 2020-07-12 LAB — CBC WITH DIFFERENTIAL/PLATELET
Absolute Monocytes: 482 cells/uL (ref 200–950)
Basophils Absolute: 26 cells/uL (ref 0–200)
Basophils Relative: 0.3 %
Eosinophils Absolute: 52 cells/uL (ref 15–500)
Eosinophils Relative: 0.6 %
HCT: 42.1 % (ref 38.5–50.0)
Hemoglobin: 13.7 g/dL (ref 13.2–17.1)
Lymphs Abs: 1453 cells/uL (ref 850–3900)
MCH: 28.5 pg (ref 27.0–33.0)
MCHC: 32.5 g/dL (ref 32.0–36.0)
MCV: 87.5 fL (ref 80.0–100.0)
MPV: 11.2 fL (ref 7.5–12.5)
Monocytes Relative: 5.6 %
Neutro Abs: 6588 cells/uL (ref 1500–7800)
Neutrophils Relative %: 76.6 %
Platelets: 243 10*3/uL (ref 140–400)
RBC: 4.81 10*6/uL (ref 4.20–5.80)
RDW: 14.1 % (ref 11.0–15.0)
Total Lymphocyte: 16.9 %
WBC: 8.6 10*3/uL (ref 3.8–10.8)

## 2020-07-12 NOTE — Telephone Encounter (Signed)
Patient came in to office today for lab draw. Please send in refill on MTX to [facility] on [address]. Patient is out, and due for next dose on Monday.

## 2020-07-12 NOTE — Telephone Encounter (Signed)
Last Visit: 05/09/2020 Next Visit: 10/08/2020 Labs: 07/12/2020 pending  Current Dose per office note 05/09/2020, Methotrexate 2.5 mg 4 tablets twice a week on Mondays and Tuesdays only DX: Rheumatoid arthritis involving multiple sites with positive rheumatoid factor  Okay to refill MTX?

## 2020-07-15 MED ORDER — METHOTREXATE 2.5 MG PO TABS: 20.0000 mg | ORAL_TABLET | ORAL | 0 refills | Status: DC

## 2020-07-18 ENCOUNTER — Other Ambulatory Visit: Payer: Self-pay | Admitting: Family Medicine

## 2020-07-18 DIAGNOSIS — I10 Essential (primary) hypertension: Secondary | ICD-10-CM

## 2020-07-22 ENCOUNTER — Other Ambulatory Visit: Payer: Self-pay | Admitting: Family Medicine

## 2020-07-22 DIAGNOSIS — I10 Essential (primary) hypertension: Secondary | ICD-10-CM

## 2020-07-24 ENCOUNTER — Ambulatory Visit (INDEPENDENT_AMBULATORY_CARE_PROVIDER_SITE_OTHER): Payer: Medicare (Managed Care) | Admitting: Family Medicine

## 2020-07-24 ENCOUNTER — Encounter: Payer: Self-pay | Admitting: Family Medicine

## 2020-07-24 ENCOUNTER — Other Ambulatory Visit: Payer: Self-pay

## 2020-07-24 VITALS — BP 136/68 | HR 77 | Ht 75.0 in | Wt 237.2 lb

## 2020-07-24 DIAGNOSIS — E119 Type 2 diabetes mellitus without complications: Secondary | ICD-10-CM

## 2020-07-24 DIAGNOSIS — Z23 Encounter for immunization: Secondary | ICD-10-CM

## 2020-07-24 DIAGNOSIS — B351 Tinea unguium: Secondary | ICD-10-CM | POA: Diagnosis not present

## 2020-07-24 LAB — POCT GLYCOSYLATED HEMOGLOBIN (HGB A1C): Hemoglobin A1C: 7 % — AB (ref 4.0–5.6)

## 2020-07-24 NOTE — Assessment & Plan Note (Signed)
Pt compliant with medications.  A1c at goal: 7.0.  Continue current medications.  Foot exam normal today except for onychomycosis.

## 2020-07-24 NOTE — Patient Instructions (Signed)
It was nice to see you again,   Your A1c looks great.  Keep taking your diabetes medications.    If you would like me to put in a referral to either the foot doctor or the physical therapist just call us and let us know.    Have a great day,   Clemetine Marker, MD

## 2020-07-24 NOTE — Assessment & Plan Note (Signed)
Received covid booster today.

## 2020-07-24 NOTE — Assessment & Plan Note (Signed)
Pt declined referral. States he will call the podiatrist he saw previously a few years ago.

## 2020-07-24 NOTE — Progress Notes (Signed)
    SUBJECTIVE:   CHIEF COMPLAINT / HPI:   DM: patient takes his invokana and metformin regularly. Sometimes when he is in an insurance 'donut hole' and can't afford his medications he will ration them.  No numbness or tingling of the toes.  He would like to know if there is a [place] podiatrist he can see for his onychomycosis. No issues with urination.   Patient doesn't have any complaints today. Previous knee pain is better.  He has had the covid shots and would like the booster today.  He has had his flu vaccine this year.  All the medications listed on his chart are up to date he says.   He takes his medication for parkinsons but doesn't feel like it helped his balance.  He has not had any falls.  His balance issues are not severe enough that he would like to see a physical therapist.    PERTINENT  PMH / [place]: parkinsons, DM2  OBJECTIVE:   BP 136/68   Pulse 77   Ht 6\' 3"  (1.905 m)   Wt 237 lb 3.2 oz (107.6 kg)   SpO2 99%   BMI 29.65 kg/m   Gen: alert, oriented.  No acute distress.  Pleasant elderly male.  Appears stated age.  CV: RRR. No murmurs.  Pulm: LCTAB. No wheezes Ext: normal sensation to light touch in feet bilaterally.  Normal DP pulses.  Thickened nails of the great toes bilaterally.   ASSESSMENT/PLAN:   T2DM (type 2 diabetes mellitus) ([place]) Pt compliant with medications.  A1c at goal: 7.0.  Continue current medications.  Foot exam normal today except for onychomycosis.    ONYCHOMYCOSIS Pt declined referral. States he will call the podiatrist he saw previously a few years ago.    Encounter for immunization Received covid booster today.      Benay Pike, MD [place]

## 2020-08-13 ENCOUNTER — Other Ambulatory Visit: Payer: Self-pay

## 2020-08-13 DIAGNOSIS — E118 Type 2 diabetes mellitus with unspecified complications: Secondary | ICD-10-CM

## 2020-08-14 MED ORDER — METFORMIN HCL 1000 MG PO TABS: ORAL_TABLET | ORAL | 3 refills | Status: DC

## 2020-09-02 ENCOUNTER — Ambulatory Visit: Payer: Medicare HMO | Admitting: Neurology

## 2020-09-02 ENCOUNTER — Encounter: Payer: Self-pay | Admitting: Neurology

## 2020-09-02 ENCOUNTER — Ambulatory Visit: Payer: Medicare Other | Admitting: Neurology

## 2020-09-02 VITALS — BP 136/64 | HR 82 | Ht 75.0 in | Wt 241.0 lb

## 2020-09-02 DIAGNOSIS — G4752 REM sleep behavior disorder: Secondary | ICD-10-CM

## 2020-09-02 DIAGNOSIS — G20 Parkinson's disease: Secondary | ICD-10-CM | POA: Diagnosis not present
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
## 2020-09-02 NOTE — Patient Instructions (Addendum)
It was nice to see you again today.  Your exam is stable.  I am glad that you have benefited from the levodopa.  For your dream enactment behavior or active dreams, please try Melatonin at night: take 3 mg to 6 mg, at bedtime. You can go up to 10 mg if needed. It is over the counter and comes in pill form, chewable form and spray, if you prefer.    Your prescription for levodopa is up-to-date.  Please follow-up in 6 months to see one of our nurse practitioners routinely.

## 2020-09-06 ENCOUNTER — Other Ambulatory Visit: Payer: Self-pay | Admitting: Family Medicine

## 2020-09-06 DIAGNOSIS — Z8546 Personal history of malignant neoplasm of prostate: Secondary | ICD-10-CM

## 2020-09-06 NOTE — Telephone Encounter (Signed)
Patient is calling needing a refill on his Tamsulosin (Flomax) 0.4mg . He is stating he needs some now and would like it sent to [facility] on [address]. But he is wanting to get his refills also sent through mail order if refills could be sent to mail order, but he is needing to pick up a supply of Flomax at [place] for now. Please advise. Thanks!

## 2020-09-09 ENCOUNTER — Other Ambulatory Visit: Payer: Self-pay

## 2020-09-09 ENCOUNTER — Other Ambulatory Visit: Payer: Self-pay | Admitting: Family Medicine

## 2020-09-09 DIAGNOSIS — I10 Essential (primary) hypertension: Secondary | ICD-10-CM

## 2020-09-09 DIAGNOSIS — Z8546 Personal history of malignant neoplasm of prostate: Secondary | ICD-10-CM

## 2020-09-09 DIAGNOSIS — E118 Type 2 diabetes mellitus with unspecified complications: Secondary | ICD-10-CM

## 2020-09-09 MED ORDER — AMLODIPINE BESYLATE 10 MG PO TABS: 10.0000 mg | ORAL_TABLET | Freq: Every day | ORAL | 3 refills | Status: DC

## 2020-09-09 MED ORDER — TAMSULOSIN HCL 0.4 MG PO CAPS: 0.8000 mg | ORAL_CAPSULE | Freq: Every day | ORAL | 0 refills | Status: DC

## 2020-09-09 MED ORDER — CARBIDOPA-LEVODOPA 25-100 MG PO TABS: 1.0000 | ORAL_TABLET | Freq: Three times a day (TID) | ORAL | 3 refills | Status: DC

## 2020-09-09 MED ORDER — METFORMIN HCL 1000 MG PO TABS: ORAL_TABLET | ORAL | 3 refills | Status: DC

## 2020-09-09 NOTE — Telephone Encounter (Signed)
Patient calling nurse line to check status of rx refill. Patient reports that he is now completely out of medication.   Please advise.   Talbot Grumbling, RN

## 2020-09-09 NOTE — Telephone Encounter (Signed)
All prescriptions sent in.  

## 2020-09-09 NOTE — Telephone Encounter (Signed)
Pt also requesting long term refill of tamsulosin be sent to [facility]. He states he is taking 2 caps 2 times daily.  He is completely out of tamsulosin and needs a 15 day supply sent to [facility] to get him through until he gets the mail order Rx. Ottis Stain, CMA

## 2020-09-11 ENCOUNTER — Telehealth: Payer: Self-pay | Admitting: Neurology

## 2020-09-11 MED ORDER — CARBIDOPA-LEVODOPA 25-100 MG PO TABS: 1.0000 | ORAL_TABLET | Freq: Three times a day (TID) | ORAL | 3 refills | Status: DC

## 2020-09-11 NOTE — Telephone Encounter (Signed)
Noted. I have removed [employer] and have sent refills to [facility].

## 2020-09-11 NOTE — Telephone Encounter (Signed)
Pt called stating that his carbidopa-levodopa (SINEMET IR) 25-100 MG tablet was sent to the wrong pharmacy. Pt is no longer with [employer] and is wanting that pharmacy taken out of his chart.  Pt is needing his carbidopa-levodopa (SINEMET IR) 25-100 MG tablet to the [facility]

## 2020-09-11 NOTE — Telephone Encounter (Signed)
Pt informed.Eric Holloway, CMA ° °
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
## 2020-09-30 ENCOUNTER — Other Ambulatory Visit: Payer: Self-pay | Admitting: *Deleted

## 2020-09-30 ENCOUNTER — Other Ambulatory Visit: Payer: Self-pay

## 2020-09-30 MED ORDER — METHOTREXATE 2.5 MG PO TABS: 20.0000 mg | ORAL_TABLET | ORAL | 0 refills | Status: DC

## 2020-09-30 NOTE — Telephone Encounter (Signed)
Next Visit: 10/08/2020  Last Visit: 05/09/2020  Last Fill: 07/15/2020  DX: Rheumatoid arthritis involving multiple sites with positive rheumatoid factor   Current Dose per office note 05/09/2020, Methotrexate 2.5 mg 4 tablets twice a week on Mondays and Tuesdays only  Labs: 07/12/2020, Glucose is 144. Rest of CMP WNL. CBC WNL.  Okay to refill MTX?

## 2020-09-30 NOTE — Telephone Encounter (Signed)
Patient called requesting prescription refill of Methotrexate (90 day supply) to be sent to [facility].  Patient states he only has 4 tablets remaining.

## 2020-10-08 ENCOUNTER — Ambulatory Visit: Payer: Medicare Other | Admitting: Physician Assistant

## 2020-10-08 ENCOUNTER — Other Ambulatory Visit: Payer: Self-pay

## 2020-10-08 ENCOUNTER — Encounter: Payer: Self-pay | Admitting: Physician Assistant

## 2020-10-08 VITALS — BP 150/73 | HR 63 | Resp 16 | Ht 75.0 in | Wt 237.0 lb

## 2020-10-08 DIAGNOSIS — Z8679 Personal history of other diseases of the circulatory system: Secondary | ICD-10-CM

## 2020-10-08 DIAGNOSIS — Z8546 Personal history of malignant neoplasm of prostate: Secondary | ICD-10-CM

## 2020-10-08 DIAGNOSIS — Z87448 Personal history of other diseases of urinary system: Secondary | ICD-10-CM

## 2020-10-08 DIAGNOSIS — M19072 Primary osteoarthritis, left ankle and foot: Secondary | ICD-10-CM

## 2020-10-08 DIAGNOSIS — M19071 Primary osteoarthritis, right ankle and foot: Secondary | ICD-10-CM

## 2020-10-08 DIAGNOSIS — G8929 Other chronic pain: Secondary | ICD-10-CM

## 2020-10-08 DIAGNOSIS — M0579 Rheumatoid arthritis with rheumatoid factor of multiple sites without organ or systems involvement: Secondary | ICD-10-CM | POA: Diagnosis not present

## 2020-10-08 DIAGNOSIS — Z79899 Other long term (current) drug therapy: Secondary | ICD-10-CM

## 2020-10-08 DIAGNOSIS — G252 Other specified forms of tremor: Secondary | ICD-10-CM

## 2020-10-08 DIAGNOSIS — M19041 Primary osteoarthritis, right hand: Secondary | ICD-10-CM

## 2020-10-08 DIAGNOSIS — M5136 Other intervertebral disc degeneration, lumbar region: Secondary | ICD-10-CM

## 2020-10-08 DIAGNOSIS — M5030 Other cervical disc degeneration, unspecified cervical region: Secondary | ICD-10-CM

## 2020-10-08 DIAGNOSIS — R42 Dizziness and giddiness: Secondary | ICD-10-CM

## 2020-10-08 DIAGNOSIS — R519 Headache, unspecified: Secondary | ICD-10-CM

## 2020-10-08 DIAGNOSIS — Z8639 Personal history of other endocrine, nutritional and metabolic disease: Secondary | ICD-10-CM

## 2020-10-08 DIAGNOSIS — M19042 Primary osteoarthritis, left hand: Secondary | ICD-10-CM

## 2020-10-08 NOTE — Patient Instructions (Addendum)
Standing Labs We placed an order today for your standing lab work.   Please have your standing labs drawn in July and every 3 months   If possible, please have your labs drawn 2 weeks prior to your appointment so that the provider can discuss your results at your appointment.  We have open lab daily Monday through Thursday from 1:30-4:30 PM and Friday from 1:30-4:00 PM at the office of Dr. Shaili Deveshwar, [facility].   Please be advised, all patients with office appointments requiring lab work will take precedents over walk-in lab work.  If possible, please come for your lab work on Monday and Friday afternoons, as you may experience shorter wait times. The office is located at [address], [address], [place], [place] [address] No appointment is necessary.   Labs are drawn by [facility]. Please bring your co-pay at the time of your lab draw.  You may receive a bill from [facility] for your lab work.  If you wish to have your labs drawn at another location, please call the office 24 hours in advance to send orders.  If you have any questions regarding directions or hours of operation,  please call [phone].   As a reminder, please drink plenty of water prior to coming for your lab work. Thanks!     

## 2020-10-09 LAB — CBC WITH DIFFERENTIAL/PLATELET
Absolute Monocytes: 680 cells/uL (ref 200–950)
Basophils Absolute: 32 cells/uL (ref 0–200)
Basophils Relative: 0.4 %
Eosinophils Absolute: 73 cells/uL (ref 15–500)
Eosinophils Relative: 0.9 %
HCT: 43.8 % (ref 38.5–50.0)
Hemoglobin: 13.9 g/dL (ref 13.2–17.1)
Lymphs Abs: 1701 cells/uL (ref 850–3900)
MCH: 28.4 pg (ref 27.0–33.0)
MCHC: 31.7 g/dL — ABNORMAL LOW (ref 32.0–36.0)
MCV: 89.6 fL (ref 80.0–100.0)
MPV: 11.8 fL (ref 7.5–12.5)
Monocytes Relative: 8.4 %
Neutro Abs: 5613 cells/uL (ref 1500–7800)
Neutrophils Relative %: 69.3 %
Platelets: 241 10*3/uL (ref 140–400)
RBC: 4.89 10*6/uL (ref 4.20–5.80)
RDW: 14.5 % (ref 11.0–15.0)
Total Lymphocyte: 21 %
WBC: 8.1 10*3/uL (ref 3.8–10.8)

## 2020-10-09 LAB — COMPLETE METABOLIC PANEL WITH GFR
AG Ratio: 1.5 (calc) (ref 1.0–2.5)
ALT: 5 U/L — ABNORMAL LOW (ref 9–46)
AST: 12 U/L (ref 10–35)
Albumin: 4 g/dL (ref 3.6–5.1)
Alkaline phosphatase (APISO): 58 U/L (ref 35–144)
BUN: 15 mg/dL (ref 7–25)
CO2: 28 mmol/L (ref 20–32)
Calcium: 9.9 mg/dL (ref 8.6–10.3)
Chloride: 104 mmol/L (ref 98–110)
Creat: 1.09 mg/dL (ref 0.70–1.18)
GFR, Est African American: 79 mL/min/{1.73_m2} (ref >=60)
GFR, Est Non African American: 68 mL/min/{1.73_m2} (ref >=60)
Globulin: 2.7 g/dL (calc) (ref 1.9–3.7)
Glucose, Bld: 103 mg/dL — ABNORMAL HIGH (ref 65–99)
Potassium: 4.2 mmol/L (ref 3.5–5.3)
Sodium: 141 mmol/L (ref 135–146)
Total Bilirubin: 0.4 mg/dL (ref 0.2–1.2)
Total Protein: 6.7 g/dL (ref 6.1–8.1)

## 2020-10-09 NOTE — Progress Notes (Signed)
ALT is low-5. AST WNL.  Rest of CMP WNL.  CBC WNL.  We will continue to monitor.  No medication changes recommended at this time.

## 2020-10-23 ENCOUNTER — Telehealth: Payer: Self-pay | Admitting: Family Medicine

## 2020-10-23 NOTE — Telephone Encounter (Signed)
Patient stopped in he rec'd 30 day supply of his medicine should have been 90 day supply.  Name of his meds is Tamsulosin 0.4 mg.  He didn't get his full amount of this medicine.  Any questions please give patient call [phone]

## 2020-10-31 ENCOUNTER — Other Ambulatory Visit: Payer: Self-pay | Admitting: Family Medicine

## 2020-10-31 DIAGNOSIS — Z8546 Personal history of malignant neoplasm of prostate: Secondary | ICD-10-CM

## 2020-10-31 MED ORDER — TAMSULOSIN HCL 0.4 MG PO CAPS: 0.8000 mg | ORAL_CAPSULE | Freq: Every day | ORAL | 1 refills | Status: DC

## 2020-10-31 NOTE — Telephone Encounter (Signed)
Refill sent for 90 day supply. 

## 2020-11-15 ENCOUNTER — Ambulatory Visit (INDEPENDENT_AMBULATORY_CARE_PROVIDER_SITE_OTHER): Payer: Medicare (Managed Care) | Admitting: Family Medicine

## 2020-11-15 ENCOUNTER — Other Ambulatory Visit: Payer: Self-pay | Admitting: *Deleted

## 2020-11-15 ENCOUNTER — Other Ambulatory Visit: Payer: Self-pay

## 2020-11-15 ENCOUNTER — Encounter: Payer: Self-pay | Admitting: Family Medicine

## 2020-11-15 VITALS — BP 142/58 | HR 58 | Ht 75.0 in | Wt 237.2 lb

## 2020-11-15 DIAGNOSIS — E119 Type 2 diabetes mellitus without complications: Secondary | ICD-10-CM | POA: Diagnosis not present

## 2020-11-15 DIAGNOSIS — Z8546 Personal history of malignant neoplasm of prostate: Secondary | ICD-10-CM

## 2020-11-15 DIAGNOSIS — E785 Hyperlipidemia, unspecified: Secondary | ICD-10-CM

## 2020-11-15 DIAGNOSIS — E118 Type 2 diabetes mellitus with unspecified complications: Secondary | ICD-10-CM | POA: Diagnosis not present

## 2020-11-15 DIAGNOSIS — I10 Essential (primary) hypertension: Secondary | ICD-10-CM | POA: Diagnosis not present

## 2020-11-15 LAB — POCT GLYCOSYLATED HEMOGLOBIN (HGB A1C): HbA1c, POC (controlled diabetic range): 6.9 % (ref 0.0–7.0)

## 2020-11-15 NOTE — Patient Instructions (Addendum)
It was nice to see you today,  I will put in the orders for your prescriptions as requested.  Enjoy your retirement.  It was a pleasure getting to know you.  Have a great day,  Clemetine Marker, MD

## 2020-11-15 NOTE — Progress Notes (Signed)
    SUBJECTIVE:   CHIEF COMPLAINT / HPI:   Diabetes: Patient has been compliant with his medications.  No concerns.  No side effects.  [facility].  Patient needs new strips for an Accu-Chek meter.  Is unsure if the Accu-Chek is what is covered by his insurance.  Patient recently retired last week from working at [employer].  Looking forward to relaxing.  HTN: Patient compliant with his amlodipine  HLD: Patient compliant with his atorvastatin.  PERTINENT  PMH / PSH: DM, HTN, HLD,  OBJECTIVE:   BP (!) 142/58   Pulse (!) 58   Ht 6\' 3"  (1.905 m)   Wt 237 lb 3.2 oz (107.6 kg)   SpO2 100%   BMI 29.65 kg/m   General: Alert, oriented.  No acute distress. CV: Regular rate and rhythm Pulmonary no respiratory distress.   ASSESSMENT/PLAN:   T2DM (type 2 diabetes mellitus) ([facility]) A1c at goal.  6.9.  Will refill all medications and sent to [facility].  We will try to send an Accu check strips if covered by his insurance.  Essential hypertension, benign Well-controlled continue current medication.     Benay Pike, MD [place]

## 2020-11-15 NOTE — Telephone Encounter (Signed)
Opened in Error.

## 2020-11-17 MED ORDER — ATORVASTATIN CALCIUM 40 MG PO TABS: 40.0000 mg | ORAL_TABLET | Freq: Every day | ORAL | 3 refills | Status: DC

## 2020-11-17 MED ORDER — CANAGLIFLOZIN 100 MG PO TABS: 100.0000 mg | ORAL_TABLET | Freq: Every day | ORAL | 3 refills | Status: DC

## 2020-11-17 MED ORDER — METFORMIN HCL 1000 MG PO TABS: ORAL_TABLET | ORAL | 3 refills | Status: DC

## 2020-11-17 MED ORDER — AMLODIPINE BESYLATE 10 MG PO TABS: 10.0000 mg | ORAL_TABLET | Freq: Every day | ORAL | 3 refills | Status: DC

## 2020-11-17 MED ORDER — TAMSULOSIN HCL 0.4 MG PO CAPS: 0.8000 mg | ORAL_CAPSULE | Freq: Every day | ORAL | 3 refills | Status: DC

## 2020-11-17 NOTE — Assessment & Plan Note (Signed)
A1c at goal.  6.9.  Will refill all medications and sent to [facility].  We will try to send an Accu check strips if covered by his insurance.

## 2020-11-17 NOTE — Assessment & Plan Note (Signed)
Well controlled continue current medication

## 2020-11-19 ENCOUNTER — Telehealth: Payer: Self-pay | Admitting: *Deleted

## 2020-11-19 NOTE — Telephone Encounter (Signed)
Received fax from pharmacy  Asking if patient is to take both the invokana and the jardiance? Roi Jafari Zimmerman Rumple, CMA

## 2020-11-19 NOTE — Telephone Encounter (Signed)
Doesn't look like the jardiance has been prescribed since 2020.  It's not on his current med list.  Has the pharmacy been filling it recently?  It should just be invokana.

## 2020-11-20 NOTE — Telephone Encounter (Signed)
Contacted pharmacy and informed them of below.Toyna Erisman Zimmerman Rumple, CMA

## 2020-11-26 ENCOUNTER — Telehealth: Payer: Self-pay

## 2020-11-26 NOTE — Telephone Encounter (Signed)
Received fax from [facility]. They would like to verify is the pt to stop the Jardiance and take the Invokana 100 mg tab? Please let pharmacy know if the Jardiance Rx needs to be cancelled. Ottis Stain, CMA

## 2020-11-27 NOTE — Telephone Encounter (Signed)
I believe we previously called them to inform them of this.  It is not on his current med list.  He has not taken Jardiance since 2020 when he switched to Invokana due to insurance reasons.  I do not know when the last time his Jardiance was filled, but it should not have been recently.  Yes the Jardiance prescription should be canceled.  Please inform them of this unless they would like me specifically to call them and tell them.

## 2020-11-27 NOTE — Telephone Encounter (Signed)
Spoke to pharmacy. They will cancel Jardiance Rx. Ottis Stain, CMA

## 2020-12-23 ENCOUNTER — Telehealth: Payer: Self-pay | Admitting: Rheumatology

## 2020-12-23 MED ORDER — METHOTREXATE 2.5 MG PO TABS: 20.0000 mg | ORAL_TABLET | ORAL | 0 refills | Status: DC

## 2020-12-23 NOTE — Telephone Encounter (Signed)
Patient request refill on MTX sent to mail order pharmacy. Patient alson would like to know when he is due for his next lab draw. Please call to advise.

## 2020-12-23 NOTE — Telephone Encounter (Signed)
Patient advised he is due for his next labs at the beginning of July. Patient also reminded of his next appointment.   Next Visit: 03/20/2021  Last Visit: 10/08/2020  Last Fill: 09/30/2020  DX: Rheumatoid arthritis involving multiple sites with positive rheumatoid factor  Current Dose per office note 10/08/2020: Methotrexate 2.5 mg 4 tablets twice a week on Mondays and Tuesdays only  Labs: 10/08/2020 ALT is low-5. AST WNL.  Rest of CMP WNL.  CBC WNL.  We will continue tomonitor.    Okay to refill MTX?

## 2021-02-19 ENCOUNTER — Other Ambulatory Visit: Payer: Self-pay | Admitting: *Deleted

## 2021-02-19 DIAGNOSIS — Z79899 Other long term (current) drug therapy: Secondary | ICD-10-CM

## 2021-02-19 LAB — COMPLETE METABOLIC PANEL WITH GFR
AG Ratio: 1.4 (calc) (ref 1.0–2.5)
ALT: 11 U/L (ref 9–46)
AST: 19 U/L (ref 10–35)
Albumin: 3.7 g/dL (ref 3.6–5.1)
Alkaline phosphatase (APISO): 60 U/L (ref 35–144)
BUN: 13 mg/dL (ref 7–25)
CO2: 31 mmol/L (ref 20–32)
Calcium: 8.7 mg/dL (ref 8.6–10.3)
Chloride: 103 mmol/L (ref 98–110)
Creat: 1.04 mg/dL (ref 0.70–1.28)
Globulin: 2.6 g/dL (calc) (ref 1.9–3.7)
Glucose, Bld: 185 mg/dL — ABNORMAL HIGH (ref 65–99)
Potassium: 4.1 mmol/L (ref 3.5–5.3)
Sodium: 140 mmol/L (ref 135–146)
Total Bilirubin: 0.6 mg/dL (ref 0.2–1.2)
Total Protein: 6.3 g/dL (ref 6.1–8.1)
eGFR: 77 mL/min/{1.73_m2} (ref >=60)

## 2021-02-19 LAB — CBC WITH DIFFERENTIAL/PLATELET
Absolute Monocytes: 435 cells/uL (ref 200–950)
Basophils Absolute: 33 cells/uL (ref 0–200)
Basophils Relative: 0.4 %
Eosinophils Absolute: 49 cells/uL (ref 15–500)
Eosinophils Relative: 0.6 %
HCT: 41.8 % (ref 38.5–50.0)
Hemoglobin: 13.7 g/dL (ref 13.2–17.1)
Lymphs Abs: 1312 cells/uL (ref 850–3900)
MCH: 29.1 pg (ref 27.0–33.0)
MCHC: 32.8 g/dL (ref 32.0–36.0)
MCV: 88.9 fL (ref 80.0–100.0)
MPV: 11.2 fL (ref 7.5–12.5)
Monocytes Relative: 5.3 %
Neutro Abs: 6371 cells/uL (ref 1500–7800)
Neutrophils Relative %: 77.7 %
Platelets: 237 10*3/uL (ref 140–400)
RBC: 4.7 10*6/uL (ref 4.20–5.80)
RDW: 14.2 % (ref 11.0–15.0)
Total Lymphocyte: 16 %
WBC: 8.2 10*3/uL (ref 3.8–10.8)

## 2021-02-19 LAB — HM DIABETES EYE EXAM

## 2021-02-20 NOTE — Progress Notes (Signed)
Glucose is elevated, CBC is normal.  Please notify patient and forward results to his PCP.

## 2021-03-03 ENCOUNTER — Encounter: Payer: Self-pay | Admitting: Family Medicine

## 2021-03-03 ENCOUNTER — Ambulatory Visit: Payer: Medicare (Managed Care) | Admitting: Family Medicine

## 2021-03-03 ENCOUNTER — Ambulatory Visit: Payer: Medicare Other | Admitting: Adult Health

## 2021-03-03 VITALS — BP 132/72 | HR 64 | Ht 75.0 in | Wt 237.5 lb

## 2021-03-03 DIAGNOSIS — G20 Parkinson's disease: Secondary | ICD-10-CM

## 2021-03-03 DIAGNOSIS — G4752 REM sleep behavior disorder: Secondary | ICD-10-CM | POA: Diagnosis not present

## 2021-03-03 MED ORDER — CARBIDOPA-LEVODOPA 25-100 MG PO TABS: 1.0000 | ORAL_TABLET | Freq: Three times a day (TID) | ORAL | 3 refills | Status: DC
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
## 2021-03-03 NOTE — Patient Instructions (Signed)
Below is our plan:  We will continue Sinemet 1 tablet tree times daily. Please try to decrease caffeine intake a little at a time to see if this helps with restless legs. May consider long acting melatonin over the counter if you wish.   Please make sure you are staying well hydrated. I recommend 50-60 ounces daily. Well balanced diet and regular exercise encouraged. Consistent sleep schedule with 6-8 hours recommended.   Please continue follow up with care team as directed.   Follow up with Dr Rexene Alberts in 6 month   You may receive a survey regarding today's visit. I encourage you to leave honest feed back as I do use this information to improve patient care. Thank you for seeing me today!
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
## 2021-03-20 ENCOUNTER — Ambulatory Visit: Payer: Medicare Other | Admitting: Rheumatology

## 2021-03-20 ENCOUNTER — Encounter: Payer: Self-pay | Admitting: Rheumatology

## 2021-03-20 ENCOUNTER — Other Ambulatory Visit: Payer: Self-pay

## 2021-03-20 VITALS — BP 136/75 | HR 66 | Ht 75.0 in | Wt 236.6 lb

## 2021-03-20 DIAGNOSIS — M19072 Primary osteoarthritis, left ankle and foot: Secondary | ICD-10-CM

## 2021-03-20 DIAGNOSIS — M19041 Primary osteoarthritis, right hand: Secondary | ICD-10-CM | POA: Diagnosis not present

## 2021-03-20 DIAGNOSIS — M0579 Rheumatoid arthritis with rheumatoid factor of multiple sites without organ or systems involvement: Secondary | ICD-10-CM | POA: Diagnosis not present

## 2021-03-20 DIAGNOSIS — Z8546 Personal history of malignant neoplasm of prostate: Secondary | ICD-10-CM

## 2021-03-20 DIAGNOSIS — Z8679 Personal history of other diseases of the circulatory system: Secondary | ICD-10-CM

## 2021-03-20 DIAGNOSIS — M19042 Primary osteoarthritis, left hand: Secondary | ICD-10-CM

## 2021-03-20 DIAGNOSIS — Z79899 Other long term (current) drug therapy: Secondary | ICD-10-CM

## 2021-03-20 DIAGNOSIS — Z8639 Personal history of other endocrine, nutritional and metabolic disease: Secondary | ICD-10-CM | POA: Diagnosis not present

## 2021-03-20 DIAGNOSIS — G252 Other specified forms of tremor: Secondary | ICD-10-CM

## 2021-03-20 DIAGNOSIS — M5030 Other cervical disc degeneration, unspecified cervical region: Secondary | ICD-10-CM | POA: Diagnosis not present

## 2021-03-20 DIAGNOSIS — M19071 Primary osteoarthritis, right ankle and foot: Secondary | ICD-10-CM

## 2021-03-20 DIAGNOSIS — M25561 Pain in right knee: Secondary | ICD-10-CM

## 2021-03-20 DIAGNOSIS — M5136 Other intervertebral disc degeneration, lumbar region: Secondary | ICD-10-CM

## 2021-03-20 DIAGNOSIS — R519 Headache, unspecified: Secondary | ICD-10-CM

## 2021-03-20 DIAGNOSIS — G8929 Other chronic pain: Secondary | ICD-10-CM

## 2021-03-20 DIAGNOSIS — Z87448 Personal history of other diseases of urinary system: Secondary | ICD-10-CM

## 2021-03-20 DIAGNOSIS — R42 Dizziness and giddiness: Secondary | ICD-10-CM

## 2021-03-20 MED ORDER — METHOTREXATE 2.5 MG PO TABS: 20.0000 mg | ORAL_TABLET | ORAL | 0 refills | Status: DC

## 2021-03-20 NOTE — Patient Instructions (Addendum)
Standing Labs We placed an order today for your standing lab work.   Please have your standing labs drawn in November and every 3 months  If possible, please have your labs drawn 2 weeks prior to your appointment so that the provider can discuss your results at your appointment.  Please note that you may see your imaging and lab results in [place] before we have reviewed them. We may be awaiting multiple results to interpret others before contacting you. Please allow our office up to 72 hours to thoroughly review all of the results before contacting the office for clarification of your results.  We have open lab daily: Monday through Thursday from 1:30-4:30 PM and Friday from 1:30-4:00 PM at the office of Dr. Bo Merino, [facility].   Please be advised, all patients with office appointments requiring lab work will take precedent over walk-in lab work.  If possible, please come for your lab work on Monday and Friday afternoons, as you may experience shorter wait times. The office is located at [address], [place], [place],  [address] No appointment is necessary.   Labs are drawn by [facility]. Please bring your co-pay at the time of your lab draw.  You may receive a bill from [facility] for your lab work.  If you wish to have your labs drawn at another location, please call the office 24 hours in advance to send orders.  If you have any questions regarding directions or hours of operation,  please call [phone].   As a reminder, please drink plenty of water prior to coming for your lab work. Thanks!   Vaccines You are taking a medication(s) that can suppress your immune system.  The following immunizations are recommended: Flu annually Covid-19  Td/Tdap (tetanus, diphtheria, pertussis) every 10 years Pneumonia (Prevnar 15 then Pneumovax 23 at least 1 year apart.  Alternatively, can take Prevnar 20 without needing additional dose) Shingrix: 2 doses from 4  weeks to 6 months apart  Please check with your PCP to make sure you are up to date.   If you test POSITIVE for COVID19 and have MILD to MODERATE symptoms: First, call your PCP if you would like to receive COVID19 treatment AND Hold your medications during the infection and for at least 1 week after your symptoms have resolved: Injectable medication (Benlysta, Cimzia, Cosentyx, Enbrel, Humira, Orencia, Remicade, Simponi, Stelara, Taltz, Tremfya) Methotrexate Leflunomide (Arava) Azathioprine Mycophenolate (Cellcept) Roma Kayser, or Rinvoq Otezla If you take Actemra or Kevzara, you DO NOT need to hold these for COVID19 infection.  If you test POSITIVE for COVID19 and have NO symptoms: First, call your PCP if you would like to receive COVID19 treatment AND Hold your medications for at least 10 days after the day that you tested positive Injectable medication (Benlysta, Cimzia, Cosentyx, Enbrel, Humira, Orencia, Remicade, Simponi, Stelara, Taltz, Tremfya) Methotrexate Leflunomide (Arava) Azathioprine Mycophenolate (Cellcept) Roma Kayser, or Rinvoq Otezla If you take Actemra or Kevzara, you DO NOT need to hold these for COVID19 infection.  If you have signs or symptoms of an infection or start antibiotics: First, call your PCP for workup of your infection. Hold your medication through the infection, until you complete your antibiotics, and until symptoms resolve if you take the following: Injectable medication (Actemra, Benlysta, Cimzia, Cosentyx, Enbrel, Humira, Kevzara, Orencia, Remicade, Simponi, Stelara, Taltz, Tremfya) Methotrexate Leflunomide (Arava) Mycophenolate (Cellcept) Roma Kayser, or Rinvoq  Heart Disease Prevention   Your inflammatory disease increases your risk of heart disease which  includes heart attack, stroke, atrial fibrillation (irregular heartbeats), high blood pressure, heart failure and atherosclerosis (plaque in the arteries).  It is  important to reduce your risk by:   Keep blood pressure, cholesterol, and blood sugar at healthy levels   Smoking Cessation   Maintain a healthy weight  BMI 20-25   Eat a healthy diet  Plenty of fresh fruit, vegetables, and whole grains  Limit saturated fats, foods high in sodium, and added sugars  DASH and Mediterranean diet   Increase physical activity  Recommend moderate physically activity for 150 minutes per week/ 30 minutes a day for five days a week These can be broken up into three separate ten-minute sessions during the day.   Reduce Stress  Meditation, slow breathing exercises, yoga, coloring books  Dental visits twice a year   Knee Exercises Ask your health care provider which exercises are safe for you. Do exercises exactly as told by your health care provider and adjust them as directed. It is normal to feel mild stretching, pulling, tightness, or discomfort as you do these exercises. Stop right away if you feel sudden pain or your pain gets worse. Do not begin these exercises until told by your health care provider. Stretching and range-of-motion exercises These exercises warm up your muscles and joints and improve the movement and flexibility of your knee. These exercises also help to relieve pain and swelling. Knee extension, prone Lie on your abdomen (prone position) on a bed. Place your left / right knee just beyond the edge of the surface so your knee is not on the bed. You can put a towel under your left / right thigh just above your kneecap for comfort. Relax your leg muscles and allow gravity to straighten your knee (extension). You should feel a stretch behind your left / right knee. Hold this position for __________ seconds. Scoot up so your knee is supported between repetitions. Repeat __________ times. Complete this exercise __________ times a day. Knee flexion, active  Lie on your back with both legs straight. If this causes back discomfort, bend your left  / right knee so your foot is flat on the floor. Slowly slide your left / right heel back toward your buttocks. Stop when you feel a gentle stretch in the front of your knee or thigh (flexion). Hold this position for __________ seconds. Slowly slide your left / right heel back to the starting position. Repeat __________ times. Complete this exercise __________ times a day. Quadriceps stretch, prone  Lie on your abdomen on a firm surface, such as a bed or padded floor. Bend your left / right knee and hold your ankle. If you cannot reach your ankle or pant leg, loop a belt around your foot and grab the belt instead. Gently pull your heel toward your buttocks. Your knee should not slide out to the side. You should feel a stretch in the front of your thigh and knee (quadriceps). Hold this position for __________ seconds. Repeat __________ times. Complete this exercise __________ times a day. Hamstring, supine Lie on your back (supine position). Loop a belt or towel over the ball of your left / right foot. The ball of your foot is on the walking surface, right under your toes. Straighten your left / right knee and slowly pull on the belt to raise your leg until you feel a gentle stretch behind your knee (hamstring). Do not let your knee bend while you do this. Keep your other leg flat on the  floor. Hold this position for __________ seconds. Repeat __________ times. Complete this exercise __________ times a day. Strengthening exercises These exercises build strength and endurance in your knee. Endurance is the ability to use your muscles for a long time, even after they get tired. Quadriceps, isometric This exercise stretches the muscles in front of your thigh (quadriceps) without moving your knee joint (isometric). Lie on your back with your left / right leg extended and your other knee bent. Put a rolled towel or small pillow under your knee if told by your health care provider. Slowly tense the  muscles in the front of your left / right thigh. You should see your kneecap slide up toward your hip or see increased dimpling just above the knee. This motion will push the back of the knee toward the floor. For __________ seconds, hold the muscle as tight as you can without increasing your pain. Relax the muscles slowly and completely. Repeat __________ times. Complete this exercise __________ times a day. Straight leg raises This exercise stretches the muscles in front of your thigh (quadriceps) and the muscles that move your hips (hip flexors). Lie on your back with your left / right leg extended and your other knee bent. Tense the muscles in the front of your left / right thigh. You should see your kneecap slide up or see increased dimpling just above the knee. Your thigh may even shake a bit. Keep these muscles tight as you raise your leg 4-6 inches (10-15 cm) off the floor. Do not let your knee bend. Hold this position for __________ seconds. Keep these muscles tense as you lower your leg. Relax your muscles slowly and completely after each repetition. Repeat __________ times. Complete this exercise __________ times a day. Hamstring, isometric Lie on your back on a firm surface. Bend your left / right knee about __________ degrees. Dig your left / right heel into the surface as if you are trying to pull it toward your buttocks. Tighten the muscles in the back of your thighs (hamstring) to "dig" as hard as you can without increasing any pain. Hold this position for __________ seconds. Release the tension gradually and allow your muscles to relax completely for __________ seconds after each repetition. Repeat __________ times. Complete this exercise __________ times a day. Hamstring curls If told by your health care provider, do this exercise while wearing ankle weights. Begin with __________ lb weights. Then increase the weight by 1 lb (0.5 kg) increments. Do not wear ankle weights that  are more than __________ lb. Lie on your abdomen with your legs straight. Bend your left / right knee as far as you can without feeling pain. Keep your hips flat against the floor. Hold this position for __________ seconds. Slowly lower your leg to the starting position. Repeat __________ times. Complete this exercise __________ times a day. Squats This exercise strengthens the muscles in front of your thigh and knee (quadriceps). Stand in front of a table, with your feet and knees pointing straight ahead. You may rest your hands on the table for balance but not for support. Slowly bend your knees and lower your hips like you are going to sit in a chair. Keep your weight over your heels, not over your toes. Keep your lower legs upright so they are parallel with the table legs. Do not let your hips go lower than your knees. Do not bend lower than told by your health care provider. If your knee pain increases, do not  bend as low. Hold the squat position for __________ seconds. Slowly push with your legs to return to standing. Do not use your hands to pull yourself to standing. Repeat __________ times. Complete this exercise __________ times a day. Wall slides This exercise strengthens the muscles in front of your thigh and knee (quadriceps). Lean your back against a smooth wall or door, and walk your feet out 18-24 inches (46-61 cm) from it. Place your feet hip-width apart. Slowly slide down the wall or door until your knees bend __________ degrees. Keep your knees over your heels, not over your toes. Keep your knees in line with your hips. Hold this position for __________ seconds. Repeat __________ times. Complete this exercise __________ times a day. Straight leg raises This exercise strengthens the muscles that rotate the leg at the hip and move it away from your body (hip abductors). Lie on your side with your left / right leg in the top position. Lie so your head, shoulder, knee, and  hip line up. You may bend your bottom knee to help you keep your balance. Roll your hips slightly forward so your hips are stacked directly over each other and your left / right knee is facing forward. Leading with your heel, lift your top leg 4-6 inches (10-15 cm). You should feel the muscles in your outer hip lifting. Do not let your foot drift forward. Do not let your knee roll toward the ceiling. Hold this position for __________ seconds. Slowly return your leg to the starting position. Let your muscles relax completely after each repetition. Repeat __________ times. Complete this exercise __________ times a day. Straight leg raises This exercise stretches the muscles that move your hips away from the front of the pelvis (hip extensors). Lie on your abdomen on a firm surface. You can put a pillow under your hips if that is more comfortable. Tense the muscles in your buttocks and lift your left / right leg about 4-6 inches (10-15 cm). Keep your knee straight as you lift your leg. Hold this position for __________ seconds. Slowly lower your leg to the starting position. Let your leg relax completely after each repetition. Repeat __________ times. Complete this exercise __________ times a day. This information is not intended to replace advice given to you by your health care provider. Make sure you discuss any questions you have with your health care provider. Document Revised: 04/12/2018 Document Reviewed: 04/12/2018 Elsevier Patient Education  2022 [employer].

## 2021-03-21 IMAGING — CR THORACIC SPINE 2 VIEWS
3 series · 3 of 3 positions shown · non-contrast
Comparison: None.

CLINICAL DATA: Initial evaluation for acute trauma, fall.

EXAM:
THORACIC SPINE 2 VIEWS

[t-spine ap]
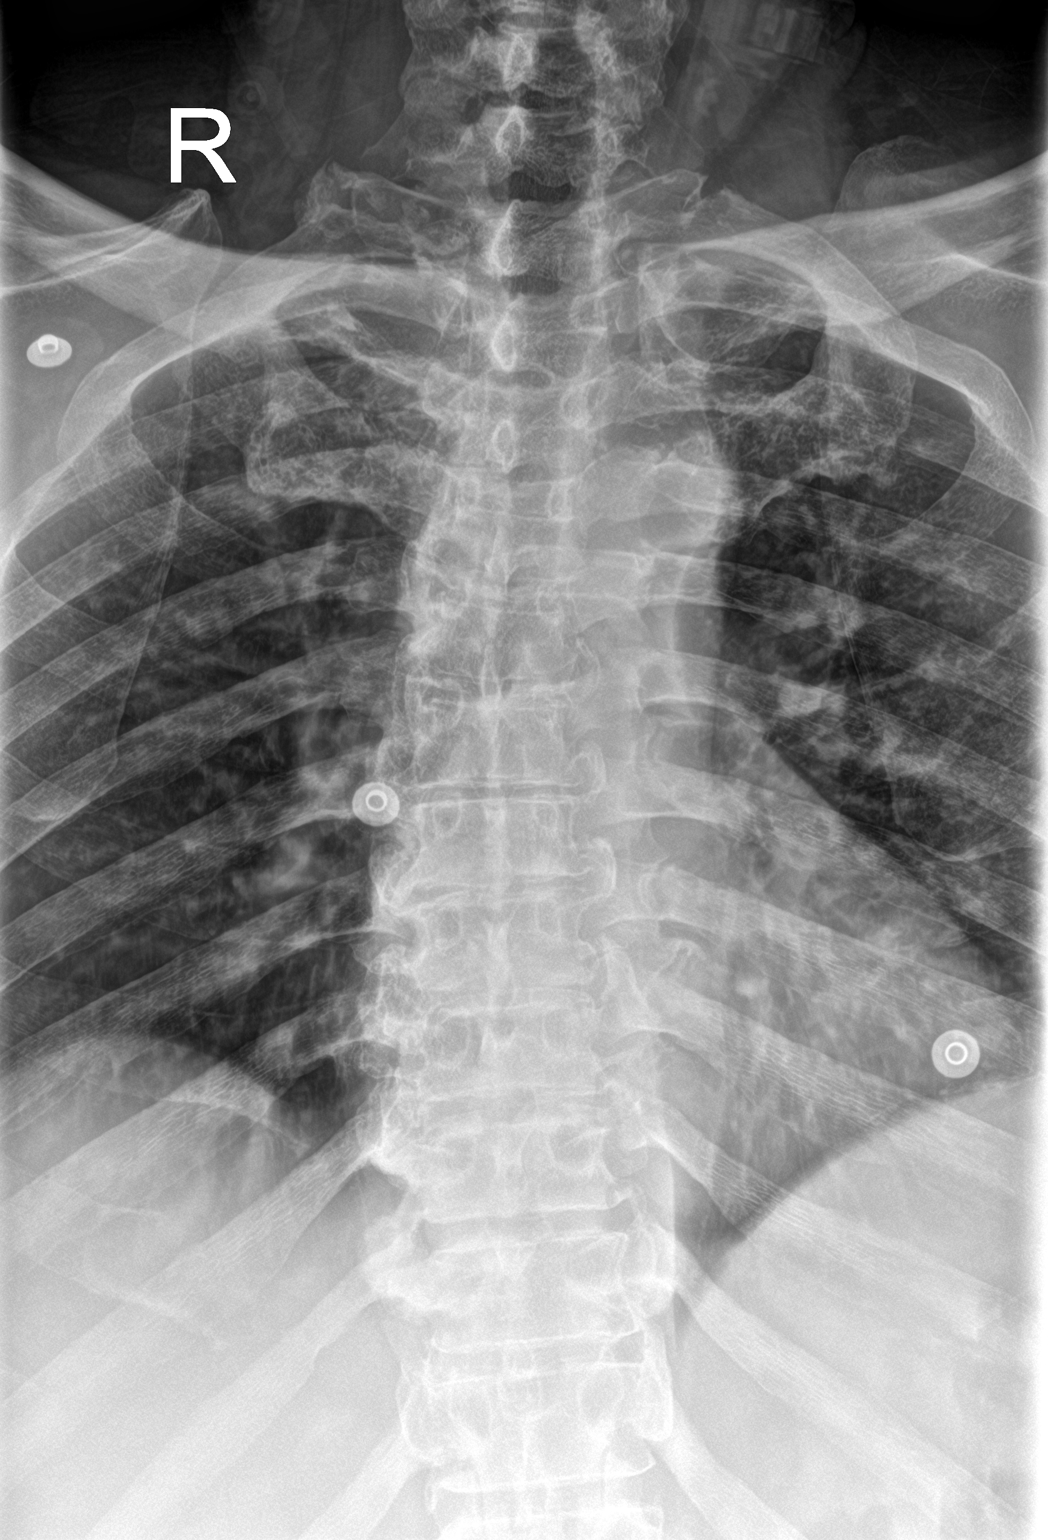

[t-spine lat]
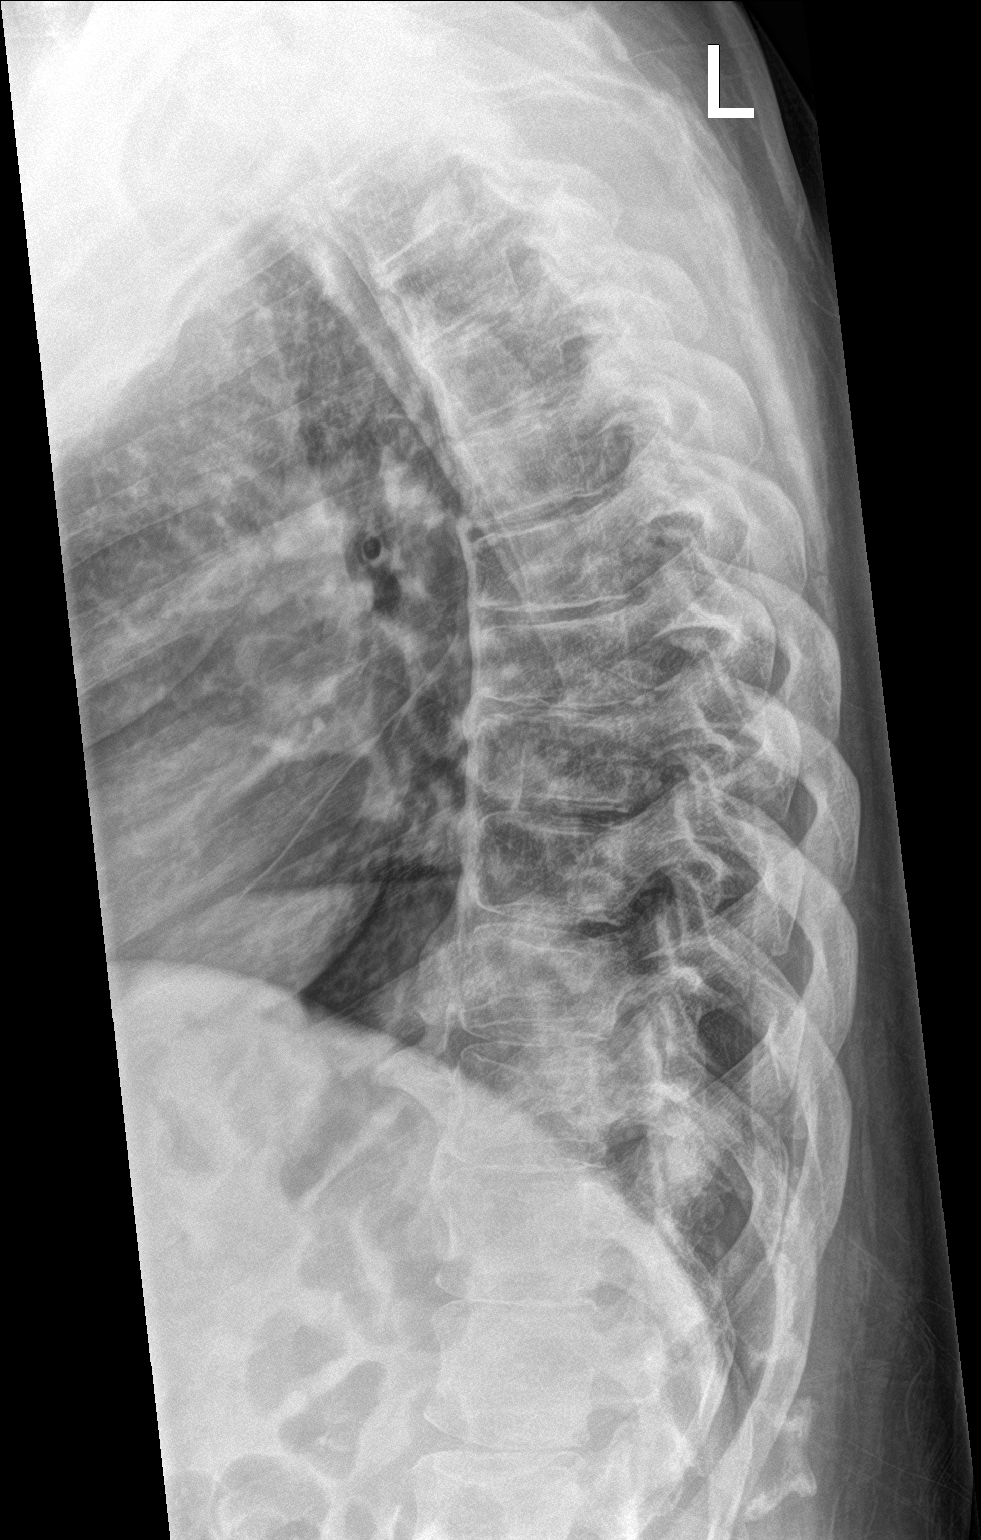

[t-spine swimmers]
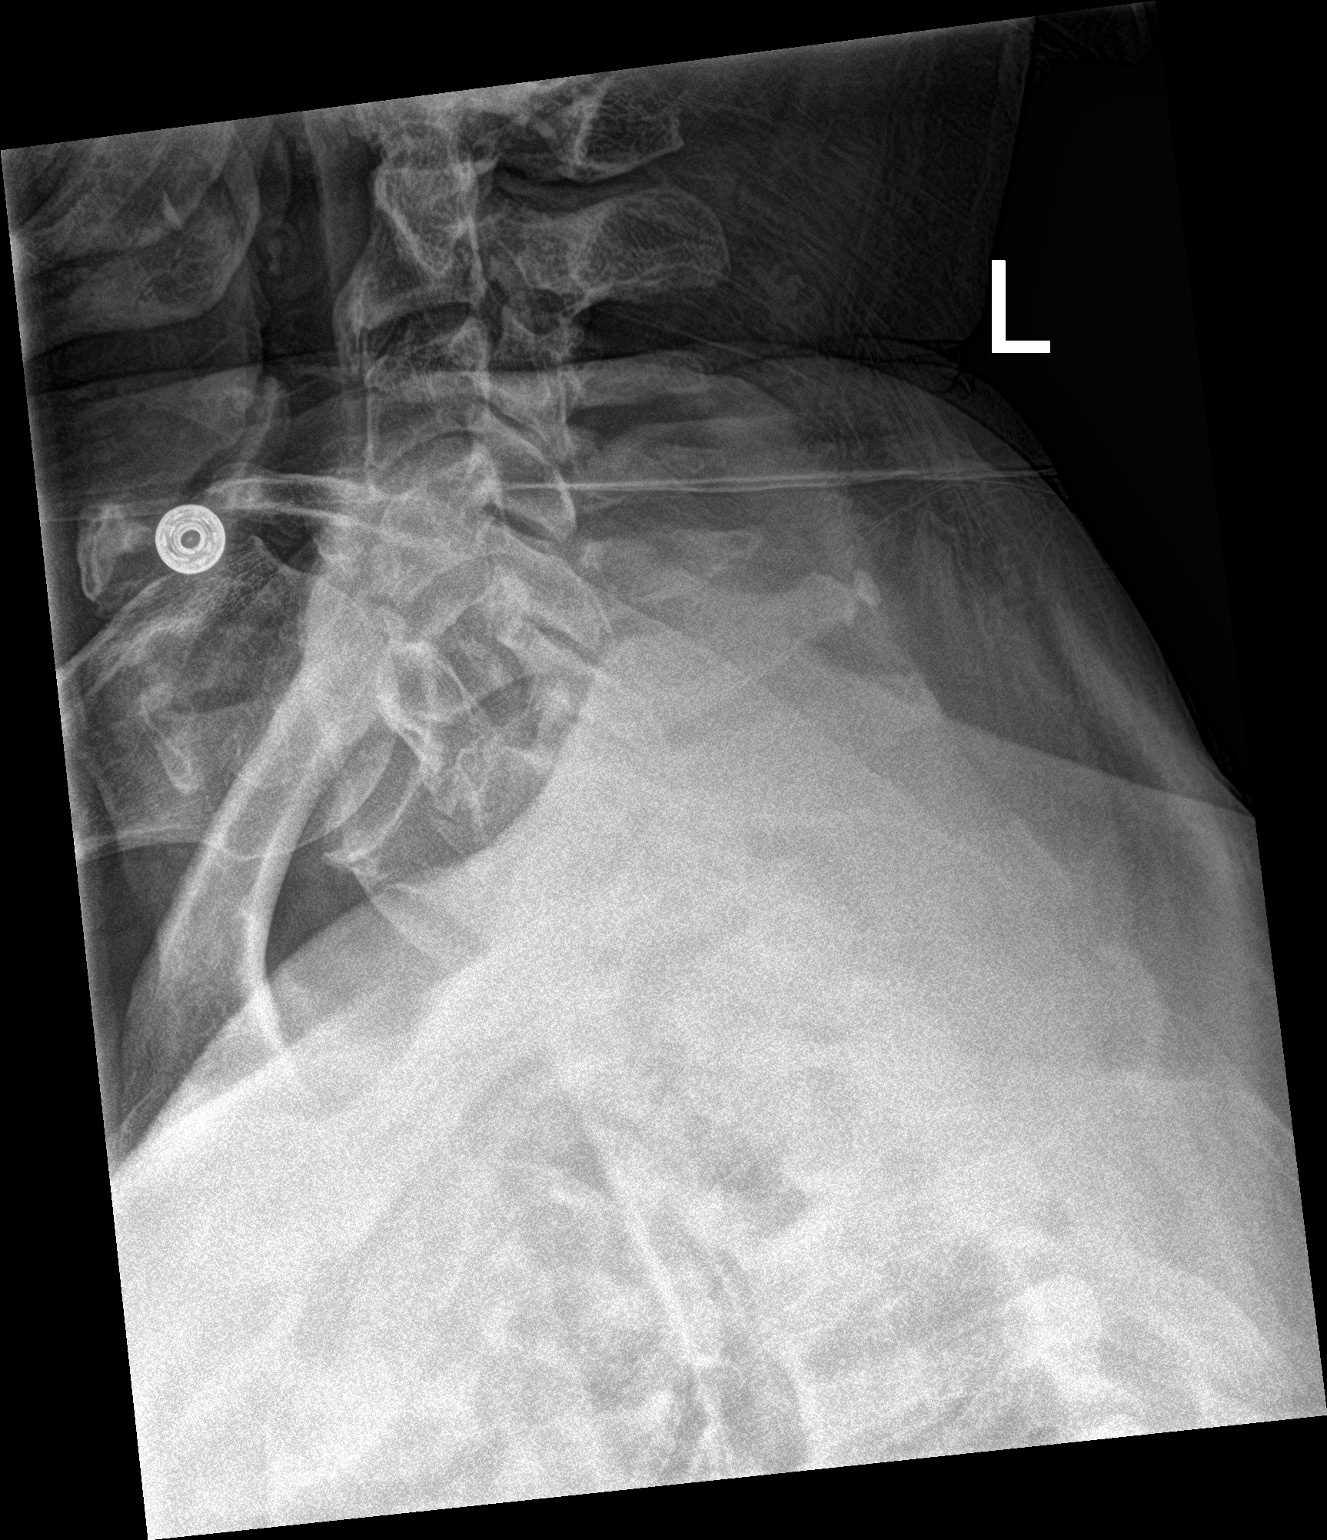

[3 of 3 positions shown; findings below may reference images not displayed]

FINDINGS: Mild dextroscoliosis. Alignment otherwise normal with preservation
of the normal thoracic kyphosis. No listhesis or malalignment.
Vertebral body heights maintained without evidence for acute or
chronic fracture. Moderate multilevel degenerative endplate spurring
seen throughout the mid and lower thoracic spine.

Visualized soft tissues demonstrate no acute finding.
IMPRESSION: No radiographic evidence for acute traumatic injury within the
thoracic spine.

## 2021-03-22 IMAGING — DX PORTABLE CHEST - 1 VIEW
1 series · 1 of 1 positions shown · non-contrast
Comparison: One-view chest x-ray 12/13/2018

CLINICAL DATA: Shortness of breath.

EXAM:
PORTABLE CHEST 1 VIEW

[chest]
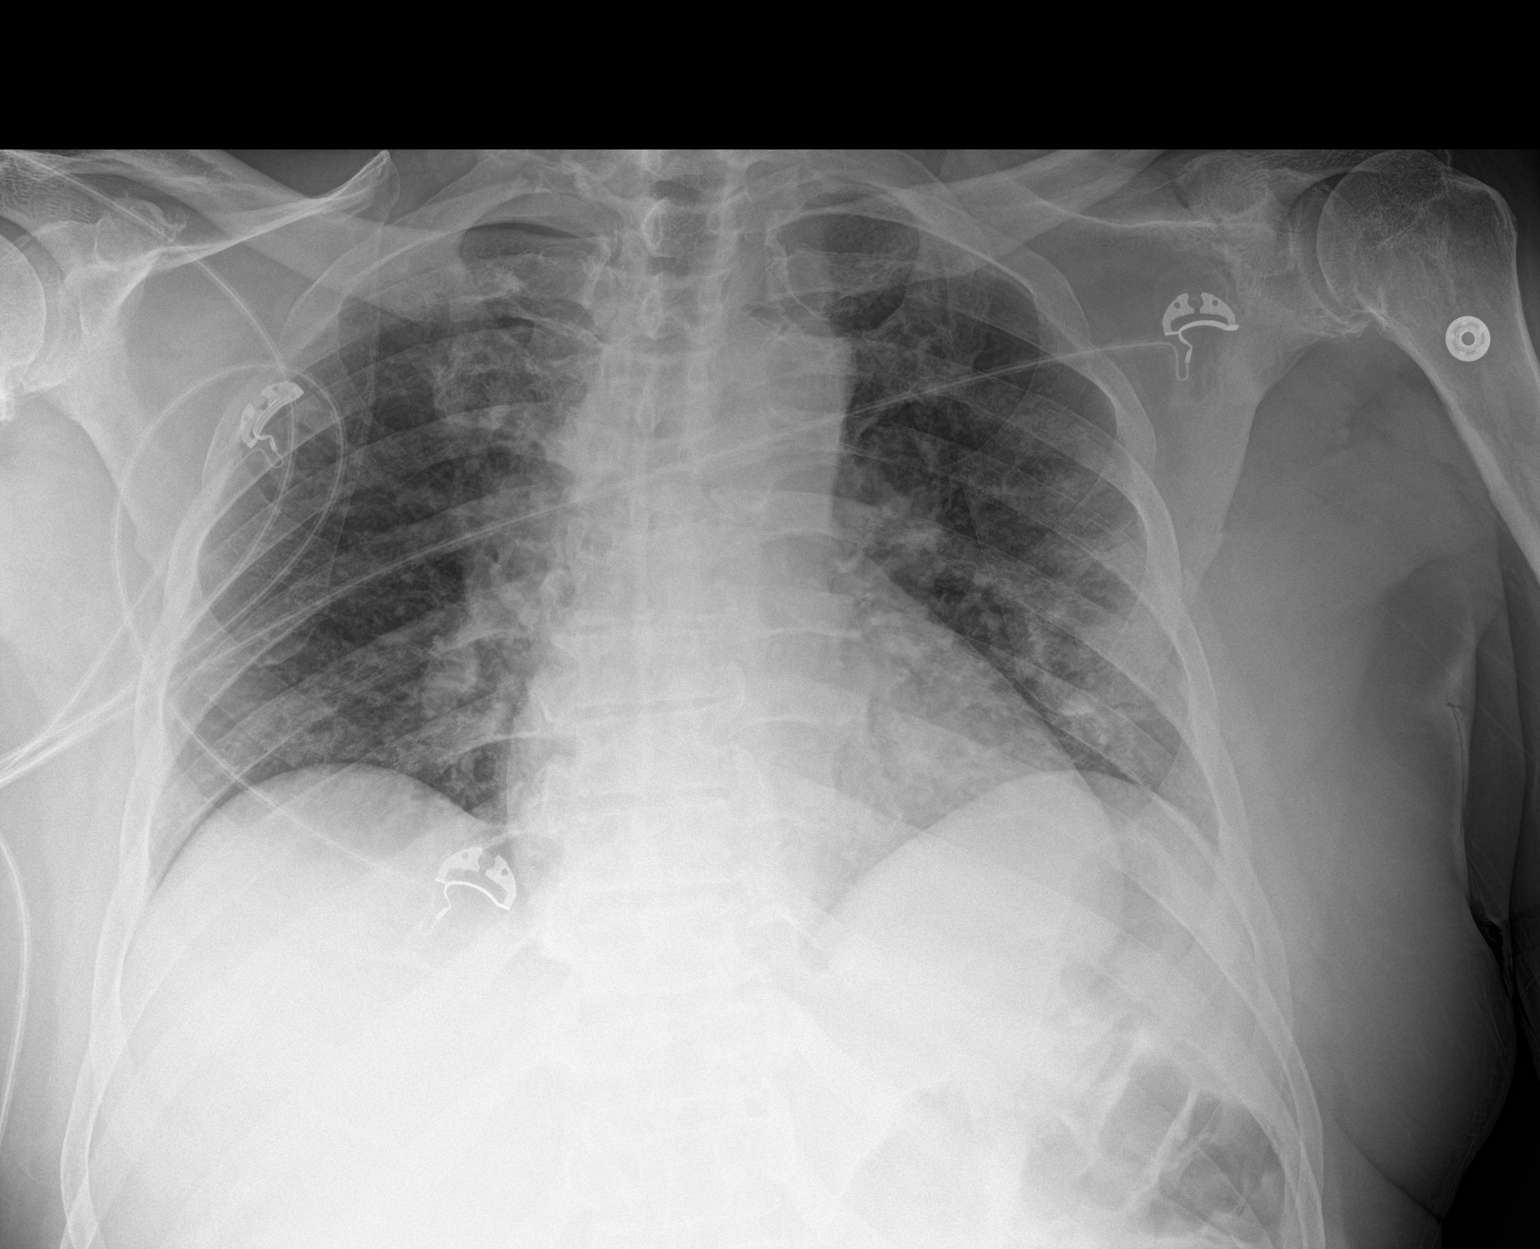

[1 of 1 positions shown; findings below may reference images not displayed]

FINDINGS: The heart size is exaggerated by low lung volumes. Chronic
interstitial opacities are again seen. No significant airspace
consolidation is present. There is no edema or effusion. Mild
degenerative changes are noted at the left shoulder.
IMPRESSION: 1. Low lung volumes.
2. No acute cardiopulmonary disease.

## 2021-04-01 ENCOUNTER — Telehealth: Payer: Self-pay | Admitting: Family Medicine

## 2021-04-01 NOTE — Telephone Encounter (Signed)
Patient came in to schedule an appointment. Appointment is made for 04/16/2021 at 11:30am. He is stating he is wanting his pharmacy changed from [facility] on [address] to the [facility] on the corner of [address]. Please advise. Thanks!

## 2021-04-16 ENCOUNTER — Other Ambulatory Visit: Payer: Self-pay

## 2021-04-16 ENCOUNTER — Ambulatory Visit (INDEPENDENT_AMBULATORY_CARE_PROVIDER_SITE_OTHER): Payer: Medicare Other | Admitting: Family Medicine

## 2021-04-16 ENCOUNTER — Encounter: Payer: Self-pay | Admitting: Family Medicine

## 2021-04-16 ENCOUNTER — Ambulatory Visit (INDEPENDENT_AMBULATORY_CARE_PROVIDER_SITE_OTHER): Payer: Medicare Other

## 2021-04-16 VITALS — BP 146/71 | HR 63 | Ht 75.0 in | Wt 236.0 lb

## 2021-04-16 DIAGNOSIS — M25561 Pain in right knee: Secondary | ICD-10-CM

## 2021-04-16 DIAGNOSIS — Z23 Encounter for immunization: Secondary | ICD-10-CM

## 2021-04-16 DIAGNOSIS — E119 Type 2 diabetes mellitus without complications: Secondary | ICD-10-CM

## 2021-04-16 LAB — POCT GLYCOSYLATED HEMOGLOBIN (HGB A1C): HbA1c, POC (controlled diabetic range): 6.6 % (ref 0.0–7.0)

## 2021-04-16 MED ORDER — CANAGLIFLOZIN 100 MG PO TABS: 100.0000 mg | ORAL_TABLET | Freq: Every day | ORAL | 3 refills | Status: DC

## 2021-04-16 MED ORDER — METHYLPREDNISOLONE ACETATE 40 MG/ML IJ SUSP
40.0000 mg | Freq: Once | INTRAMUSCULAR | Status: AC
  Administered 2021-04-16: 40 mg via INTRAMUSCULAR

## 2021-04-16 MED ORDER — BLOOD GLUCOSE MONITOR KIT: PACK | 0 refills | Status: DC

## 2021-04-16 NOTE — Patient Instructions (Signed)
It was a pleasure seeing you today.  Regarding your knee pain only attempted to drain some fluid off of it as well as injected it with a steroid and some lidocaine to help with the pain.  This will hopefully relieve the pain for at least 6 months.  Regarding your diabetes your hemoglobin A1c was 6.6 which is improved from previous exams.  We will continue on your current regimen and recheck your diabetes and 3 months.  If you have any questions or concerns call the clinic.  I hope you have a wonderful afternoon!

## 2021-04-16 NOTE — Progress Notes (Signed)
    SUBJECTIVE:   CHIEF COMPLAINT / HPI:  Right knee pain Patient reports acute worsening right knee pain over the past 2 weeks.  He reports swelling in his right knee and tenderness to palpation along the distal margin of his knee joint.  Denies any injuries to the knee.  Reports that he does have a history of osteoarthritis in the right knee.  He also has known rheumatoid arthritis for which he follows with rheumatology.  No fevers, chills, erythema or warmth to the knee.  Diabetes checkup Patient reports good compliance with his diabetes medications.  Most recent hemoglobin A1c was 6.9.  Denies any signs or symptoms of hypoglycemia.  OBJECTIVE:   BP (!) 146/71   Pulse 63   Ht _0  (1.905 m)   Wt 236 lb (107 kg)   SpO2 100%   BMI 29.50 kg/m   General: Well-appearing 70 year old male, no acute distress Cardiac: Regular rate and rhythm Respiratory: Normal work of breathing, speaking in full sentences MSK: Patient has diffuse swelling to right knee, no erythema or bruising appreciated, mild tenderness to the joint line bilaterally.  No pain with flexion or extension, full range of motion appreciated   After informed written consent timeout was performed, patient was lying supine on exam table.  Right knee was prepped with alcohol swab.  Cold spray was used for pain control.  Then using an 18g needle on 60cc syringe, 2 mL of blood-tinged straw-colored fluid was aspirated from right knee.  Knee was then injected with 3:1 lidocaine:depomedrol.  Patient tolerated procedure well without immediate complications  ASSESSMENT/PLAN:   T2DM (type 2 diabetes mellitus) ([place]) Patient is at his hemoglobin A1c goal being 6.6 today.  Last hemoglobin A1c was 6.9.  On metformin and Jardiance.  Refill for Jardiance sent to patient's pharmacy.  Blood glucometer kit also sent to patient's pharmacy.  Acute pain of right knee Patient with 2 weeks of right knee pain.  No injury appreciated.  Physical exam  was reassuring and does not show any signs of infection.  Differential includes osteoarthritis versus gout versus rheumatoid arthritis.  Aspiration attempted but unsuccessful.  Steroid injection performed and patient tolerated the procedure well.  Reports that as soon as the injection was over he noticed relief in his pain.  Discussed that this is not a fix for his knee issues but should provide relief.  Strict ED and return precautions given regarding signs and symptoms of infection.     Gifford Shave, MD [facility]

## 2021-04-17 DIAGNOSIS — M25561 Pain in right knee: Secondary | ICD-10-CM

## 2021-04-17 HISTORY — DX: Pain in right knee: M25.561

## 2021-04-17 NOTE — Assessment & Plan Note (Addendum)
Patient with 2 weeks of right knee pain.  No injury appreciated.  Physical exam was reassuring and does not show any signs of infection.  Differential includes osteoarthritis versus gout versus rheumatoid arthritis.  Aspiration attempted but unsuccessful.  Steroid injection performed and patient tolerated the procedure well.  Reports that as soon as the injection was over he noticed relief in his pain.  Discussed that this is not a fix for his knee issues but should provide relief.  Strict ED and return precautions given regarding signs and symptoms of infection.

## 2021-04-17 NOTE — Assessment & Plan Note (Signed)
Patient is at his hemoglobin A1c goal being 6.6 today.  Last hemoglobin A1c was 6.9.  On metformin and Jardiance.  Refill for Jardiance sent to patient's pharmacy.  Blood glucometer kit also sent to patient's pharmacy.

## 2021-04-25 ENCOUNTER — Telehealth: Payer: Self-pay

## 2021-04-25 DIAGNOSIS — E785 Hyperlipidemia, unspecified: Secondary | ICD-10-CM

## 2021-04-25 NOTE — Telephone Encounter (Signed)
Received fax from pharmacy, PA needed on [place].  Clinical questions submitted via Cover My Meds.  Waiting on response, could take up to 72 hours.  Cover My Meds info: Key: Demaris Callander, RN

## 2021-05-14 NOTE — Telephone Encounter (Signed)
Request for invokana was denied. PA denial letter states that patient needs to have tried [place].   Please advise.   Talbot Grumbling, RN

## 2021-05-16 ENCOUNTER — Emergency Department (HOSPITAL_COMMUNITY)
Admission: EM | Admit: 2021-05-16 | Discharge: 2021-05-16 | Disposition: A | Discharge status: Home / Self Care | Payer: Medicare Other | Attending: Emergency Medicine | Admitting: Emergency Medicine

## 2021-05-16 ENCOUNTER — Other Ambulatory Visit: Payer: Self-pay

## 2021-05-16 ENCOUNTER — Emergency Department (HOSPITAL_COMMUNITY): Payer: Medicare Other

## 2021-05-16 DIAGNOSIS — Z8616 Personal history of COVID-19: Secondary | ICD-10-CM | POA: Diagnosis not present

## 2021-05-16 DIAGNOSIS — I10 Essential (primary) hypertension: Secondary | ICD-10-CM | POA: Diagnosis not present

## 2021-05-16 DIAGNOSIS — M5441 Lumbago with sciatica, right side: Secondary | ICD-10-CM | POA: Insufficient documentation

## 2021-05-16 DIAGNOSIS — G20 Parkinson's disease: Secondary | ICD-10-CM | POA: Diagnosis not present

## 2021-05-16 DIAGNOSIS — M79604 Pain in right leg: Secondary | ICD-10-CM | POA: Diagnosis not present

## 2021-05-16 DIAGNOSIS — Z7984 Long term (current) use of oral hypoglycemic drugs: Secondary | ICD-10-CM | POA: Diagnosis not present

## 2021-05-16 DIAGNOSIS — M549 Dorsalgia, unspecified: Secondary | ICD-10-CM | POA: Diagnosis present

## 2021-05-16 DIAGNOSIS — Z79899 Other long term (current) drug therapy: Secondary | ICD-10-CM | POA: Diagnosis not present

## 2021-05-16 DIAGNOSIS — M545 Low back pain, unspecified: Secondary | ICD-10-CM | POA: Diagnosis not present

## 2021-05-16 DIAGNOSIS — E119 Type 2 diabetes mellitus without complications: Secondary | ICD-10-CM | POA: Diagnosis not present

## 2021-05-16 DIAGNOSIS — Z7982 Long term (current) use of aspirin: Secondary | ICD-10-CM | POA: Diagnosis not present

## 2021-05-16 DIAGNOSIS — Z87891 Personal history of nicotine dependence: Secondary | ICD-10-CM | POA: Insufficient documentation

## 2021-05-16 MED ORDER — MELOXICAM 7.5 MG PO TABS: 7.5000 mg | ORAL_TABLET | Freq: Every day | ORAL | 0 refills | Status: AC

## 2021-05-16 MED ORDER — LIDOCAINE 5 % EX PTCH: 1.0000 | MEDICATED_PATCH | CUTANEOUS | Status: DC

## 2021-05-16 MED ORDER — CYCLOBENZAPRINE HCL 5 MG PO TABS: 5.0000 mg | ORAL_TABLET | Freq: Three times a day (TID) | ORAL | 0 refills | Status: AC | PRN

## 2021-05-16 MED ORDER — LIDOCAINE 5 % EX PTCH: 1.0000 | MEDICATED_PATCH | CUTANEOUS | 0 refills | Status: DC

## 2021-05-16 MED ORDER — KETOROLAC TROMETHAMINE 30 MG/ML IJ SOLN: 30.0000 mg | Freq: Once | INTRAMUSCULAR | Status: DC

## 2021-05-16 NOTE — ED Triage Notes (Signed)
C/O low back pain on right side radiating to right leg x 1 week. Stated fell from a standing position towards the left side while doing yard work 2 weeks ago.

## 2021-05-16 NOTE — Discharge Instructions (Signed)
You were evaluated in the Emergency Department and after careful evaluation, we did not find any emergent condition requiring admission or further testing in the hospital.  Your exam/testing today was overall reassuring.  Your CT scan did not reveal evidence of acute fracture.  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  Recommend going easy on heavy lifting as this can worsen bulging disks.  Return to the emergency department in the event of loss of bowel or bladder function, severe pain in the setting of new trauma, acute weakness or numbness, numbness around your groin.   Please return to the Emergency Department if you experience any worsening of your condition.  Thank you for allowing [place] to be a part of your care.
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
## 2021-05-16 NOTE — ED Provider Notes (Signed)
Emergency Medicine Provider Triage Evaluation Note  Eric Holloway , a 70 y.o. male  was evaluated in triage.  Pt complains of gradual onset, constant, achy, lower back pain radiating down right lower extremity for the past 2 weeks.  He reports that he fell and landed directly onto his buttock 2 weeks ago and since that time has been having some pain.  He has been taking Tylenol without relief.  He denies any weakness, numbness, tingling in his legs.  He denies any urinary retention, urinary or bowel incontinence, saddle anesthesia.  Reports no history of back pain in the past.  Review of Systems  Positive: + back pain, leg pain Negative: - incontinence, retention, anesthesia, weakness/numbness/tingling  Physical Exam  BP (!) 154/73 (BP Location: Right Arm)   Pulse 78   Temp 99.1 F (37.3 C) (Oral)   Resp 14   Ht 6\' 3"  (1.905 m)   Wt 110 kg   SpO2 100%   BMI 30.31 kg/m  Gen:   Awake, no distress   Resp:  Normal effort  MSK:   Moves extremities without difficulty  Other:  + midline L spinal TTP with associated right paralumbar muslculature TTP. ROM intact to back. Strength 5/5 to BLEs. Sensation intact throughout. 2+ PT pulses.   Medical Decision Making  Medically screening exam initiated at 5:36 PM.  Appropriate orders placed.  Eric Holloway was informed that the remainder of the evaluation will be completed by another provider, this initial triage assessment does not replace that evaluation, and the importance of remaining in the ED until their evaluation is complete.     Eustaquio Maize, PA-C 05/16/21 1738    Regan Lemming, MD 05/16/21 [phone]

## 2021-05-21 MED ORDER — DAPAGLIFLOZIN PROPANEDIOL 5 MG PO TABS: 5.0000 mg | ORAL_TABLET | Freq: Every day | ORAL | 3 refills | Status: DC

## 2021-05-21 MED ORDER — FLUTICASONE PROPIONATE 50 MCG/ACT NA SUSP
2.0000 | Freq: Every day | NASAL | 11 refills | Status: DC
Start: 2021-05-21 — End: 2022-06-16

## 2021-05-21 MED ORDER — ATORVASTATIN CALCIUM 40 MG PO TABS: 40.0000 mg | ORAL_TABLET | Freq: Every day | ORAL | 3 refills | Status: DC

## 2021-05-21 NOTE — Telephone Encounter (Signed)
Called patient to discuss diabetes medications.  He reports that he just ran out of the [place].  I am going to send in [place] for him and he is not going to take anymore Invokana.  Also refilled his statin and Flonase.  No further questions or concerns.

## 2021-05-21 NOTE — Telephone Encounter (Signed)
Received follow up fax for PA on [place].   This PA was denied by insurance on 05/14/21.  Please advise.   Talbot Grumbling, RN

## 2021-06-03 ENCOUNTER — Ambulatory Visit
Admission: RE | Admit: 2021-06-03 | Discharge: 2021-06-03 | Disposition: A | Discharge status: Home / Self Care | Payer: Medicare Other | Source: Ambulatory Visit | Attending: Family Medicine | Admitting: Family Medicine

## 2021-06-03 ENCOUNTER — Other Ambulatory Visit: Payer: Self-pay

## 2021-06-03 ENCOUNTER — Other Ambulatory Visit: Payer: Self-pay | Admitting: Family Medicine

## 2021-06-03 ENCOUNTER — Ambulatory Visit (INDEPENDENT_AMBULATORY_CARE_PROVIDER_SITE_OTHER): Payer: Medicare Other | Admitting: Family Medicine

## 2021-06-03 VITALS — BP 138/80 | HR 82 | Wt 240.2 lb

## 2021-06-03 DIAGNOSIS — M25461 Effusion, right knee: Secondary | ICD-10-CM

## 2021-06-03 DIAGNOSIS — M25561 Pain in right knee: Secondary | ICD-10-CM

## 2021-06-03 DIAGNOSIS — M7989 Other specified soft tissue disorders: Secondary | ICD-10-CM | POA: Diagnosis not present

## 2021-06-03 MED ORDER — DICLOFENAC SODIUM 1 % EX GEL: 4.0000 g | Freq: Four times a day (QID) | CUTANEOUS | 1 refills | Status: DC

## 2021-06-03 NOTE — Patient Instructions (Addendum)
We are getting x-rays of your knee to make sure there is nothing serious going on.  I do feel that may be some arthritis in that knee, we will see what the images show.  I am sending in a medication called Voltaren gel which she can use on that knee as needed for pain and to 4 times daily.  Please refer to get the lidocaine patches for your back over-the-counter.

## 2021-06-03 NOTE — Progress Notes (Signed)
    SUBJECTIVE:   CHIEF COMPLAINT / HPI:   Patient reports that for the last 3-4 months he has been having right knee swelling and pain, it has worsened over the last 4-5 weeks and has made it hard to walk and get up. He has also been having back pain after he had a fall about 6 weeks ago while raking in the yard, he was seen by the ER recently and was prescribed lidocaine patches but was unable to get it due to prior auth; he is aware now that there is an over the counter version and he will get that from the pharmacy.  He has been using Tylenol with minimal improvement.   PERTINENT  PMH / PSH: Reviewed   OBJECTIVE:   BP 138/80   Pulse 82   Wt 240 lb 3.2 oz (109 kg)   SpO2 100%   BMI 30.02 kg/m   General: NAD, well-appearing, well-nourished Respiratory: No respiratory distress, breathing comfortably, able to speak in full sentences Skin: warm and dry, no rashes noted on exposed skin Psych: Appropriate affect and mood Right knee: - Inspection: Moderate effusion of the right knee compared to left.  no gross deformity. No erythema or bruising. Skin intact - Palpation: TTP over the tibial tuberosity and lateral joint space - ROM: full active ROM with flexion and extension in knee and hip - Strength: 5/5 strength, though pain with some movements.  - Neuro/vasc: NV intact - Special Tests: - LIGAMENTS: negative anterior and posterior drawer, negative Lachman's, no MCL or LCL laxity  -- MENISCUS: negative McMurray's  -- PF JOINT: nml patellar mobility bilaterally.  negative patellar grind, negative patellar apprehension   ASSESSMENT/PLAN:   Right knee pain with effusion Given patient has had knee pain for 3-4 months with acute worsening that has affected his mobility, will obtain complete standing x-rays of the right knee. Patient prescribed Voltaren gel and given strict return precautions. Does not appear consistent with gout, also has no known specific trauma that occurred a few  months ago. Consideration for overuse or meniscal injury or arthritis.    Eric Holloway, [place]

## 2021-06-06 ENCOUNTER — Telehealth: Payer: Self-pay | Admitting: Family Medicine

## 2021-06-06 NOTE — Telephone Encounter (Signed)
Called patient to discuss results of X-ray. At this time, feel like the effusion may be related to arthritic complaints, do not have suspicion for septic joint or more serious pathology currently. Discussed conservative measures with the patient including compression, continued safe exercises, and treatment with Tylenol and Ibuprofen as needed. Patient will follow-up in the next 2 weeks if worsening symptoms or no improvement to discuss considering if injection or if an aspiration would be warranted. (Do not feel the like benefits would necessarily outweigh the risks but would be work discussing with the patient).   Eric Reinig, DO

## 2021-06-12 ENCOUNTER — Other Ambulatory Visit: Payer: Self-pay

## 2021-06-12 MED ORDER — METHOTREXATE 2.5 MG PO TABS: 20.0000 mg | ORAL_TABLET | ORAL | 0 refills | Status: DC

## 2021-06-12 NOTE — Telephone Encounter (Signed)
Next Visit: 08/21/2021  Last Visit: 03/20/2021  Last Fill: 03/20/2021  DX: Rheumatoid arthritis involving multiple sites with positive rheumatoid factor   Current Dose per office note 03/20/2021: Methotrexate 2.5 mg 4 tablets twice a week on Mondays and Tuesdays only  Labs: 02/19/2021 Glucose is elevated, CBC is normal.   Patient aware he is due to update labs. Patient will be in tomorrow to update labs.   Okay to refill MTX?

## 2021-06-12 NOTE — Telephone Encounter (Signed)
Patient left a voicemail requesting prescription refill of Methotrexate.

## 2021-06-13 ENCOUNTER — Other Ambulatory Visit: Payer: Self-pay | Admitting: *Deleted

## 2021-06-13 DIAGNOSIS — Z79899 Other long term (current) drug therapy: Secondary | ICD-10-CM | POA: Diagnosis not present

## 2021-06-14 LAB — CBC WITH DIFFERENTIAL/PLATELET
Absolute Monocytes: 546 cells/uL (ref 200–950)
Basophils Absolute: 36 cells/uL (ref 0–200)
Basophils Relative: 0.4 %
Eosinophils Absolute: 73 cells/uL (ref 15–500)
Eosinophils Relative: 0.8 %
HCT: 40.9 % (ref 38.5–50.0)
Hemoglobin: 13.3 g/dL (ref 13.2–17.1)
Lymphs Abs: 1565 cells/uL (ref 850–3900)
MCH: 29.2 pg (ref 27.0–33.0)
MCHC: 32.5 g/dL (ref 32.0–36.0)
MCV: 89.9 fL (ref 80.0–100.0)
MPV: 11.3 fL (ref 7.5–12.5)
Monocytes Relative: 6 %
Neutro Abs: 6880 cells/uL (ref 1500–7800)
Neutrophils Relative %: 75.6 %
Platelets: 236 10*3/uL (ref 140–400)
RBC: 4.55 10*6/uL (ref 4.20–5.80)
RDW: 14.4 % (ref 11.0–15.0)
Total Lymphocyte: 17.2 %
WBC: 9.1 10*3/uL (ref 3.8–10.8)

## 2021-06-14 LAB — COMPLETE METABOLIC PANEL WITH GFR
AG Ratio: 1.4 (calc) (ref 1.0–2.5)
ALT: 9 U/L (ref 9–46)
AST: 11 U/L (ref 10–35)
Albumin: 3.8 g/dL (ref 3.6–5.1)
Alkaline phosphatase (APISO): 59 U/L (ref 35–144)
BUN: 14 mg/dL (ref 7–25)
CO2: 29 mmol/L (ref 20–32)
Calcium: 9.7 mg/dL (ref 8.6–10.3)
Chloride: 104 mmol/L (ref 98–110)
Creat: 0.94 mg/dL (ref 0.70–1.28)
Globulin: 2.8 g/dL (calc) (ref 1.9–3.7)
Glucose, Bld: 126 mg/dL — ABNORMAL HIGH (ref 65–99)
Potassium: 3.9 mmol/L (ref 3.5–5.3)
Sodium: 141 mmol/L (ref 135–146)
Total Bilirubin: 0.8 mg/dL (ref 0.2–1.2)
Total Protein: 6.6 g/dL (ref 6.1–8.1)
eGFR: 87 mL/min/{1.73_m2} (ref >=60)

## 2021-06-15 NOTE — Progress Notes (Signed)
CBC and CMP are normal.  Glucose is mildly elevated, probably not a fasting sample.

## 2021-06-19 ENCOUNTER — Telehealth: Payer: Self-pay | Admitting: *Deleted

## 2021-06-19 NOTE — Telephone Encounter (Signed)
Patient advised we will monitor lab work closely.Patient advised to avoid the use of oral NSAIDS while on MTX. He can use Voltaren Gel PRN.  Patient expressed understanding.

## 2021-06-19 NOTE — Telephone Encounter (Signed)
Received a potential drug to drug interaction from [employer].   Possible interaction between: Methotrexate and Diclofenac Gel 1%.  Possible Risks: Can elevate and prolong serum MTX levels resulting in MTX toxicity (leukopenia, thrombocytopenia, anemia, nephrotoxicity, mucosal ulcerations)  Reviewed by: Hazel Sams, PA-C  Recommendations: We will monitor lab work closely.Please advise the patient to avoid the use of oral NSAIDS while on MTX. He can use Voltaren Gel PRN.    Attempted to contact the patient and left message for patient to call the office.

## 2021-07-01 ENCOUNTER — Other Ambulatory Visit: Payer: Self-pay | Admitting: Family Medicine

## 2021-07-16 NOTE — Progress Notes (Signed)
° ° ° °  SUBJECTIVE:   CHIEF COMPLAINT / HPI:   Eric Holloway is a 71 y.o. male presents for right knee swelling    Right knee swelling Pt fell last year and reports a few month hx of right knee swelling and mild pain. Tylenol does not help. Diclofenac gel helps a little. Pt received a steroid injection in clinic knee 3 months ago with no relief. Aspiration also attempted at this time but not successful. Knee xray 06/03/21 showed small suprapatella effusion. Pt has hx of RA. Has upcoming appointment with rheumatologist next month.    Left lumbar back pain  Pt has chronic back pain and reports worsening over left lumbar region since fall. Requesting medication for muscle spasm.  Onset: 3 months ago Location/Radiation: left lower back, no radiation  Duration: constant  Character: ache   Aggravating Factors: none   Alleviating Factors: none  Timing: intermittent  Severity: 7/10   Denies  history of trauma, history of prolonged steroid use, bowel or bladder incontinence, urinary retention, numbness or tingling in extremities or saddle anesthesia, weakness in extremities, fevers, chills, IV drug use, hemodialysis, hx cancer, changes in weight, night sweats. No history of osteoporosis.  Hx of history of prostate cancer 16 years ago.   Occupation: No heavy lifting, repetitive vibrations, bending or twisting motions.  [name] Office Visit from 07/17/2021 in [place]  PHQ-9 Total Score 3      PERTINENT  PMH / PSH: parkinson's disease, DM, HLD  OBJECTIVE:   BP 122/60    Pulse 73    Ht 6\' 3"  (1.905 m)    Wt 241 lb (109.3 kg)    SpO2 98%    BMI 30.12 kg/m    General: Alert, no acute distress Cardio:  well perfused  Pulm: normal work of breathing Extremities: No peripheral edema.  Neuro: Cranial nerves grossly intact, 5/5 strength lower extremities with normal sensation  Knee: - Inspection: no gross deformity. Right knee swelling/effusion, no erythema  or bruising. Skin intact - Palpation: minor TTP right knee - ROM: full active ROM with flexion and extension in knee and hip - Strength: 5/5 strength - Neuro/vasc: NV intact - Special Tests: - LIGAMENTS: negative anterior and posterior drawer, negative Lachman's, no MCL or LCL laxity  -- MENISCUS: negative McMurray's, unable to tolerate thessaly's due to general instability  Gait: antalgic and shuffling   Back Normal skin, Spine with normal alignment and no deformity.  No tenderness to vertebral process palpation.  Left paraspinous muscles are tender and with spasm.   Range of motion is full at neck and limited at lumbar sacral regions. Negative straight leg test.     ASSESSMENT/PLAN:   LOW BACK PAIN, CHRONIC Worsening of chronic back pain secondary to fall. No red flags for back pain. Recommended tylenol for pain and gentle activity. Also prescribed 10mg  baclofen TID per pts request. If no improvement consider PT which pt has done in the past.  Knee effusion, right Right suprapatella effusion of right knee as evident on x-ray in Nov 22. Will likely resolve slowly. Recommended ice, rest, compression and tylenol for the pain. Also referred to sports medicine but possible aspiration which may provide some relief.    Eric Haw, MD PGY-3 [place]

## 2021-07-17 ENCOUNTER — Other Ambulatory Visit: Payer: Self-pay

## 2021-07-17 ENCOUNTER — Encounter: Payer: Self-pay | Admitting: Family Medicine

## 2021-07-17 ENCOUNTER — Ambulatory Visit (INDEPENDENT_AMBULATORY_CARE_PROVIDER_SITE_OTHER): Payer: Medicare Other | Admitting: Family Medicine

## 2021-07-17 VITALS — BP 122/60 | HR 73 | Ht 75.0 in | Wt 241.0 lb

## 2021-07-17 DIAGNOSIS — M545 Low back pain, unspecified: Secondary | ICD-10-CM | POA: Diagnosis not present

## 2021-07-17 DIAGNOSIS — M25461 Effusion, right knee: Secondary | ICD-10-CM

## 2021-07-17 DIAGNOSIS — G8929 Other chronic pain: Secondary | ICD-10-CM

## 2021-07-17 MED ORDER — BACLOFEN 10 MG PO TABS: 10.0000 mg | ORAL_TABLET | Freq: Three times a day (TID) | ORAL | 0 refills | Status: DC

## 2021-07-17 NOTE — Patient Instructions (Signed)
Thank you for coming to see me today. It was a pleasure. Today we discussed your knee swelling. It is likely due to the fall. Continue ice, diclofenac gel. I recommend referral to sports medicine. For the lower back pain I have prescribed baclofen.   Please follow-up with PCP as needed.   If you have any questions or concerns, please do not hesitate to call the office at [phone].  Best wishes,   Dr Posey Pronto

## 2021-07-20 DIAGNOSIS — M25461 Effusion, right knee: Secondary | ICD-10-CM

## 2021-07-20 HISTORY — DX: Effusion, right knee: M25.461

## 2021-07-20 NOTE — Assessment & Plan Note (Addendum)
Worsening of chronic back pain secondary to fall. No red flags for back pain. Recommended tylenol for pain and gentle activity. Also prescribed 10mg  baclofen TID per pts request. If no improvement consider PT which pt has done in the past.

## 2021-07-20 NOTE — Assessment & Plan Note (Addendum)
Right suprapatella effusion of right knee as evident on x-ray in Nov 22. Will likely resolve slowly. Recommended ice, rest, compression and tylenol for the pain. Also referred to sports medicine but possible aspiration which may provide some relief.

## 2021-07-22 ENCOUNTER — Ambulatory Visit: Payer: Medicare Other | Admitting: Sports Medicine

## 2021-07-22 VITALS — BP 148/82 | Ht 75.0 in | Wt 241.0 lb

## 2021-07-22 DIAGNOSIS — G8929 Other chronic pain: Secondary | ICD-10-CM | POA: Diagnosis not present

## 2021-07-22 DIAGNOSIS — M545 Low back pain, unspecified: Secondary | ICD-10-CM | POA: Diagnosis not present

## 2021-07-22 DIAGNOSIS — M25561 Pain in right knee: Secondary | ICD-10-CM | POA: Diagnosis not present

## 2021-07-22 MED ORDER — TRAMADOL HCL 50 MG PO TABS: 50.0000 mg | ORAL_TABLET | Freq: Two times a day (BID) | ORAL | 0 refills | Status: DC

## 2021-07-22 MED ORDER — METHYLPREDNISOLONE ACETATE 40 MG/ML IJ SUSP
40.0000 mg | Freq: Once | INTRAMUSCULAR | Status: AC
  Administered 2021-07-22: 40 mg via INTRA_ARTICULAR

## 2021-07-22 NOTE — Patient Instructions (Signed)
°  It was nice meeting you today.  I have given you a prescription for tramadol to take twice a day as needed for pain.  This is prescribed primarily for your low back but it may help your knee as well.  I injected your right knee with cortisone today.  Hopefully that will calm down the inflammation and make the knee feel better.  I will see you back in the office again in 3 weeks.  If you are still having significant back pain we can talk about a referral for spinal injections.  If your knee pain persists, I will likely get an MRI given the fact that the x-ray of your knee does not explain your pain.  Please call if you have any questions prior to your follow-up with me again in 3 weeks.

## 2021-07-23 ENCOUNTER — Other Ambulatory Visit: Payer: Self-pay | Admitting: *Deleted

## 2021-07-23 DIAGNOSIS — Z8546 Personal history of malignant neoplasm of prostate: Secondary | ICD-10-CM

## 2021-07-23 MED ORDER — TAMSULOSIN HCL 0.4 MG PO CAPS: 0.8000 mg | ORAL_CAPSULE | Freq: Every day | ORAL | 3 refills | Status: DC

## 2021-07-23 NOTE — Progress Notes (Signed)
° °  Subjective:    Patient ID: Eric Holloway, male    DOB: 1950/10/22, 71 y.o.   MRN: [id number]  HPI chief complaint: Low back and right knee pain  Patient is a very pleasant 71 year old male that comes in today with a couple of different complaints.  Main complaint is low back pain.  Pain began after he fell about 2 months ago.  He slipped and fell backwards landing directly on his back.  CT scan done in November shortly after his fall showed no acute abnormality.  He does have facet arthropathy at L3-L4 and L4-L5.  He also has some moderate spinal stenosis and subarticular stenosis bilaterally at L5-S1.  His pain is diffuse across the low back.  Worse when going from a seated to a standing position.  Pain will occasionally radiate down the right leg.  In regards to his right knee, he has had some issues with this knee in the past.  An attempted aspiration and injection done at the family medicine center was unsuccessful approximately 3 months ago.  He complains of diffuse pain and swelling.  He does have a history of rheumatoid arthritis and is scheduled to see his rheumatologist next month.  Recent x-rays of the right knee showed some mild medial compartmental narrowing and a small joint effusion but otherwise unremarkable.  Past medical history reviewed Medications reviewed Allergies reviewed    Review of Systems As above    Objective:   Physical Exam  Well-developed, well-nourished.  No acute distress.  He is slow to ambulate due to his low back and knee pain.  He has limited lumbar range of motion.  Some diffuse tenderness to palpation along the lower lumbar spine but nothing focal.  No appreciable spasm.  No focal neurological deficit of either lower extremity grossly.  Right knee: Range of motion is 0 to 120 degrees.  Trace effusion.  1+ boggy synovitis.  He is tender to palpation along the medial joint line.  Negative McMurray's.  Knee is grossly stable ligamentous exam.   Neurovascularly intact distally.  CT scan of the lumbar spine and x-rays of the right knee are as above       Assessment & Plan:   Low back pain secondary to facet arthropathy versus degenerative disc disease Right knee pain and swelling  For his low back pain I recommended a short trial of tramadol.  I will prescribe it to be taken twice daily as needed and will give him a limited supply of 10 pills.  If he finds this to be helpful then I will provide him with a refill.  For his right knee I recommended a cortisone injection today.  He is in agreement with this.  He will follow-up with me again in 3 weeks for reevaluation.  If his low back pain persists then I would consider referral to [facility] imaging for diagnostic facet injections versus ESI's.  If his right knee pain and swelling persist then consider merits of further diagnostic imaging.  He will call with questions or concerns in the interim.  Consent obtained and verified. Time-out conducted. Noted no overlying erythema, induration, or other signs of local infection. Skin prepped in a sterile fashion. Topical analgesic spray: Ethyl chloride. Joint: 25-gauge 1.5 inch Needle: 1.5 inch Completed without difficulty. Meds: 3 cc 1% Xylocaine, 1 cc (40 mg) Depo-Medrol  Advised to call if fevers/chills, erythema, induration, drainage, or persistent bleeding.
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
## 2021-08-07 ENCOUNTER — Other Ambulatory Visit: Payer: Self-pay

## 2021-08-07 DIAGNOSIS — I10 Essential (primary) hypertension: Secondary | ICD-10-CM

## 2021-08-07 MED ORDER — AMLODIPINE BESYLATE 10 MG PO TABS: 10.0000 mg | ORAL_TABLET | Freq: Every day | ORAL | 3 refills | Status: DC

## 2021-08-12 ENCOUNTER — Ambulatory Visit: Payer: Medicare Other | Admitting: Sports Medicine

## 2021-08-13 ENCOUNTER — Other Ambulatory Visit: Payer: Self-pay

## 2021-08-13 ENCOUNTER — Ambulatory Visit: Payer: Medicare Other | Admitting: Physician Assistant

## 2021-08-13 ENCOUNTER — Encounter: Payer: Self-pay | Admitting: Physician Assistant

## 2021-08-13 VITALS — BP 133/72 | HR 69 | Ht 75.0 in | Wt 239.0 lb

## 2021-08-13 DIAGNOSIS — R519 Headache, unspecified: Secondary | ICD-10-CM | POA: Diagnosis not present

## 2021-08-13 DIAGNOSIS — Z8679 Personal history of other diseases of the circulatory system: Secondary | ICD-10-CM

## 2021-08-13 DIAGNOSIS — Z87448 Personal history of other diseases of urinary system: Secondary | ICD-10-CM | POA: Diagnosis not present

## 2021-08-13 DIAGNOSIS — M19041 Primary osteoarthritis, right hand: Secondary | ICD-10-CM | POA: Diagnosis not present

## 2021-08-13 DIAGNOSIS — M25561 Pain in right knee: Secondary | ICD-10-CM | POA: Diagnosis not present

## 2021-08-13 DIAGNOSIS — G8929 Other chronic pain: Secondary | ICD-10-CM

## 2021-08-13 DIAGNOSIS — G252 Other specified forms of tremor: Secondary | ICD-10-CM | POA: Diagnosis not present

## 2021-08-13 DIAGNOSIS — Z8639 Personal history of other endocrine, nutritional and metabolic disease: Secondary | ICD-10-CM

## 2021-08-13 DIAGNOSIS — M5136 Other intervertebral disc degeneration, lumbar region: Secondary | ICD-10-CM

## 2021-08-13 DIAGNOSIS — M5030 Other cervical disc degeneration, unspecified cervical region: Secondary | ICD-10-CM

## 2021-08-13 DIAGNOSIS — Z8546 Personal history of malignant neoplasm of prostate: Secondary | ICD-10-CM

## 2021-08-13 DIAGNOSIS — M19071 Primary osteoarthritis, right ankle and foot: Secondary | ICD-10-CM

## 2021-08-13 DIAGNOSIS — M0579 Rheumatoid arthritis with rheumatoid factor of multiple sites without organ or systems involvement: Secondary | ICD-10-CM

## 2021-08-13 DIAGNOSIS — M19072 Primary osteoarthritis, left ankle and foot: Secondary | ICD-10-CM

## 2021-08-13 DIAGNOSIS — Z79899 Other long term (current) drug therapy: Secondary | ICD-10-CM | POA: Diagnosis not present

## 2021-08-13 DIAGNOSIS — M19042 Primary osteoarthritis, left hand: Secondary | ICD-10-CM

## 2021-08-13 NOTE — Patient Instructions (Signed)
Standing Labs We placed an order today for your standing lab work.   Please have your standing labs drawn in March and every 3 months   If possible, please have your labs drawn 2 weeks prior to your appointment so that the provider can discuss your results at your appointment.  Please note that you may see your imaging and lab results in [place] before we have reviewed them. We may be awaiting multiple results to interpret others before contacting you. Please allow our office up to 72 hours to thoroughly review all of the results before contacting the office for clarification of your results.  We have open lab daily: Monday through Thursday from 1:30-4:30 PM and Friday from 1:30-4:00 PM at the office of Dr. Bo Merino, [facility].   Please be advised, all patients with office appointments requiring lab work will take precedent over walk-in lab work.  If possible, please come for your lab work on Monday and Friday afternoons, as you may experience shorter wait times. The office is located at [address] [place], [place], [place] [address] No appointment is necessary.   Labs are drawn by [facility]. Please bring your co-pay at the time of your lab draw.  You may receive a bill from [facility] [facility] for your lab work.  Please note if you are on Hydroxychloroquine and and an order has been placed for a Hydroxychloroquine level, you will need to have it drawn 4 hours or more after your last dose.  If you wish to have your labs drawn at another location, please call the office 24 hours in advance to send orders.  If you have any questions regarding directions or hours of operation,  please call [phone].   As a reminder, please drink plenty of water prior to coming for your lab work. Thanks!

## 2021-08-14 ENCOUNTER — Telehealth: Payer: Self-pay | Admitting: *Deleted

## 2021-08-14 DIAGNOSIS — I10 Essential (primary) hypertension: Secondary | ICD-10-CM

## 2021-08-14 NOTE — Telephone Encounter (Signed)
LVM to call office.  We received a refill request from Eric Holloway for pts amlodipine.  It was sent to [facility] on 08/07/21. Wanted to see if he has received it or if he is not longer using this pharmacy and if not we can send to the doctor to refill it at the [facility].Emersyn Kotarski Zimmerman Rumple, CMA

## 2021-08-18 MED ORDER — AMLODIPINE BESYLATE 10 MG PO TABS: 10.0000 mg | ORAL_TABLET | Freq: Every day | ORAL | 0 refills | Status: DC

## 2021-08-18 NOTE — Telephone Encounter (Signed)
Patient calls nurse line reporting he does not use [facility]. Patient reports all maintenance medications need to go to [facility].   Patient reports he is completely out of Amlodipine and needs a 30 day supply to go to [facility].   A 30 day supply has been resent to [facility]. Amlodipine is not an active prescription at [facility].   Patient will call for a refill to be sent to [facility] towards the end of the month.

## 2021-08-19 ENCOUNTER — Ambulatory Visit: Payer: Medicare Other | Admitting: Family Medicine

## 2021-08-19 ENCOUNTER — Other Ambulatory Visit: Payer: Self-pay

## 2021-08-19 DIAGNOSIS — I10 Essential (primary) hypertension: Secondary | ICD-10-CM

## 2021-08-19 MED ORDER — AMLODIPINE BESYLATE 10 MG PO TABS: 10.0000 mg | ORAL_TABLET | Freq: Every day | ORAL | 0 refills | Status: DC

## 2021-08-21 ENCOUNTER — Ambulatory Visit: Payer: Medicare Other | Admitting: Rheumatology

## 2021-09-01 NOTE — Patient Instructions (Signed)
Below is our plan:  We will continue Sinemet 1 tablet three times daily   Please make sure you are staying well hydrated. I recommend 50-60 ounces daily. Well balanced diet and regular exercise encouraged. Consistent sleep schedule with 6-8 hours recommended.   Please continue follow up with care team as directed.   Follow up with Dr Rexene Alberts in 1 year, call sooner if needed   You may receive a survey regarding today's visit. I encourage you to leave honest feed back as I do use this information to improve patient care. Thank you for seeing me today!
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
## 2021-09-02 ENCOUNTER — Ambulatory Visit: Payer: Medicare Other | Admitting: Family Medicine

## 2021-09-02 ENCOUNTER — Encounter: Payer: Self-pay | Admitting: Family Medicine

## 2021-09-02 ENCOUNTER — Other Ambulatory Visit: Payer: Self-pay | Admitting: Physician Assistant

## 2021-09-02 VITALS — BP 146/78 | HR 71 | Ht 75.0 in | Wt 240.0 lb

## 2021-09-02 DIAGNOSIS — G20 Parkinson's disease: Secondary | ICD-10-CM | POA: Diagnosis not present

## 2021-09-02 DIAGNOSIS — G4752 REM sleep behavior disorder: Secondary | ICD-10-CM

## 2021-09-02 DIAGNOSIS — W19XXXS Unspecified fall, sequela: Secondary | ICD-10-CM

## 2021-09-02 MED ORDER — CARBIDOPA-LEVODOPA 25-100 MG PO TABS: 1.0000 | ORAL_TABLET | Freq: Three times a day (TID) | ORAL | 3 refills | Status: DC

## 2021-09-02 NOTE — Telephone Encounter (Signed)
Next Visit: 01/22/2022  Last Visit: 08/13/2021  Last Fill: 06/12/2021  DX: Rheumatoid arthritis involving multiple sites with positive rheumatoid factor   Current Dose per office note 08/13/2021: Methotrexate 2.5 mg 4 tablets twice a week on Mondays and Tuesdays only   Labs: 06/13/2021 CBC and CMP are normal.  Glucose is mildly elevated, probably not a fasting sample.  Okay to refill MTX?

## 2021-09-11 ENCOUNTER — Other Ambulatory Visit: Payer: Self-pay

## 2021-09-11 DIAGNOSIS — I10 Essential (primary) hypertension: Secondary | ICD-10-CM

## 2021-09-11 MED ORDER — AMLODIPINE BESYLATE 10 MG PO TABS: 10.0000 mg | ORAL_TABLET | Freq: Every day | ORAL | 0 refills | Status: DC

## 2021-10-13 ENCOUNTER — Ambulatory Visit: Payer: Medicare Other | Admitting: Family Medicine

## 2021-10-20 ENCOUNTER — Ambulatory Visit (INDEPENDENT_AMBULATORY_CARE_PROVIDER_SITE_OTHER): Payer: Medicare Other | Admitting: Family Medicine

## 2021-10-20 ENCOUNTER — Other Ambulatory Visit (HOSPITAL_COMMUNITY): Payer: Self-pay

## 2021-10-20 ENCOUNTER — Encounter: Payer: Self-pay | Admitting: Family Medicine

## 2021-10-20 VITALS — BP 122/58 | HR 81 | Ht 75.0 in | Wt 241.4 lb

## 2021-10-20 DIAGNOSIS — E119 Type 2 diabetes mellitus without complications: Secondary | ICD-10-CM | POA: Diagnosis not present

## 2021-10-20 LAB — POCT GLYCOSYLATED HEMOGLOBIN (HGB A1C): HbA1c, POC (controlled diabetic range): 7.2 % — AB (ref 0.0–7.0)

## 2021-10-20 MED ORDER — TETANUS-DIPHTH-ACELL PERTUSSIS 5-2.5-18.5 LF-MCG/0.5 IM SUSP: 0.5000 mL | Freq: Once | INTRAMUSCULAR | 0 refills | Status: AC

## 2021-10-20 MED ORDER — DAPAGLIFLOZIN PROPANEDIOL 10 MG PO TABS: 10.0000 mg | ORAL_TABLET | Freq: Every day | ORAL | 3 refills | Status: DC

## 2021-10-20 NOTE — Progress Notes (Signed)
? ? ?  SUBJECTIVE:  ? ?CHIEF COMPLAINT / HPI:  ? ?Diabetes checkup ?Patient presents for diabetes checkup.  He has been doing well on his current regiment and has no concerns at this time.  Previous hemoglobin A1c was 6.6.  Patient does report that he had issues paying for his [place] and so missed his medications for the month of January.  He also recently started work at the hospital and reports that he gets off around 2 AM and will usually have a snack when he gets home which may have been affecting his sugars.  Denies any hypoglycemia. ? ?OBJECTIVE:  ? ?BP (!) 122/58   Pulse 81   Ht '6\' 3"'$  (1.905 m)   Wt 241 lb 6.4 oz (109.5 kg)   SpO2 98%   BMI 30.17 kg/m?   ?General: Pleasant 71 year old male in no acute distress ?Cardiac: Regular rate and rhythm, no murmurs appreciated ?Respiratory: Normal for breathing, lungs clear to auscultation bilaterally ?Abdomen: Soft, nontender, positive bowel sounds ?MSK: Diabetic foot exam completed.  +1 pitting edema in lower extremities bilaterally which patient reports is baseline, no erythema to the calfs or warmth or tenderness ? ? ?Diabetic Foot Exam - Simple   ?Simple Foot Form ?Visual Inspection ?No deformities, no ulcerations, no other skin breakdown bilaterally: Yes ?Sensation Testing ?Intact to touch and monofilament testing bilaterally: Yes ?Pulse Check ?Posterior Tibialis and Dorsalis pulse intact bilaterally: Yes ?Comments ?  ? ? ?ASSESSMENT/PLAN:  ? ?T2DM (type 2 diabetes mellitus) ([place]) ?Patient's hemoglobin A1c increased today at 7.2 from 6.6.  On metformin and Farxiga.  Was on Farxiga 5 mg.  Increase to 10 mg.  Sent message to pharmacy team regarding assistance with paying for the Farxiga.  Plan on follow-up in 3 months with repeat hemoglobin A1c. ?  ? ? ?Gifford Shave, MD ?[place]  ?Diabetes checkup ?

## 2021-10-20 NOTE — Patient Instructions (Addendum)
It was great seeing you today!  Your hemoglobin A1c was 7.2 from 6.6 at the previous visit.  I want to increase your Farxiga to 10 mg daily.  I have sent your chart to our pharmacy team who are going to look into financial assistance to help with the cost of this medicine.  Someone should contact you regarding what needs to be done to help with that.  I would like to see you in 3 months for repeat hemoglobin A1c.  If you have any issues or concerns please call the clinic.  I sent a prescription for your tetanus vaccine to your pharmacy [facility].  You can go and have that administered.  I hope you have a wonderful day! ? ? ?

## 2021-10-21 ENCOUNTER — Ambulatory Visit: Payer: Medicare Other | Admitting: Sports Medicine

## 2021-10-21 VITALS — BP 134/78 | Ht 75.0 in | Wt 240.0 lb

## 2021-10-21 DIAGNOSIS — R29898 Other symptoms and signs involving the musculoskeletal system: Secondary | ICD-10-CM | POA: Diagnosis not present

## 2021-10-21 MED ORDER — TRAMADOL HCL 50 MG PO TABS: 50.0000 mg | ORAL_TABLET | Freq: Two times a day (BID) | ORAL | 0 refills | Status: DC | PRN

## 2021-10-21 NOTE — Assessment & Plan Note (Signed)
Patient's hemoglobin A1c increased today at 7.2 from 6.6.  On metformin and Farxiga.  Was on Farxiga 5 mg.  Increase to 10 mg.  Sent message to pharmacy team regarding assistance with paying for the Farxiga.  Plan on follow-up in 3 months with repeat hemoglobin A1c. ?

## 2021-10-21 NOTE — Progress Notes (Signed)
? ?  Subjective:  ? ? Patient ID: Eric Holloway, male    DOB: 27-Mar-1951, 71 y.o.   MRN: [id number] ? ?HPI ? ?Eric Holloway presents today for follow-up.  Cortisone injection administered by me a few weeks ago really has not improved his pain.  He describes pain that begins in the posterior lateral right hip and will radiate down the right thigh to the lateral right knee and into the lateral right calf.  He notices difficulty when going from a seated to a standing position.  Also pain at night.  Tramadol did seem to be helpful.  Previous x-rays of the right knee showed some mild arthritic change but nothing severe. ? ? ? ?Review of Systems ?As above ?   ?Objective:  ? Physical Exam ? ?Well-developed, well-nourished.  No acute distress ? ?Right knee: Full range of motion.  No obvious effusion.  There is no tenderness to palpation.  Good stability.  Negative McMurray's. ? ?Neurological exam shows no focal neurological deficit of either lower extremity.  He does have bilateral quadriceps weakness when going from a seated to a standing position. ? ? ?   ?Assessment & Plan:  ? ?Chronic right leg pain-question lumbar radiculopathy ? ?I think a large majority of his symptoms are arising from his lumbar spine.  He may also have some pain from the small amount of arthritis in his right knee.  I think at this point in time will he would benefit most from formal physical therapy.  He is in agreement with that.  He will follow-up with me again 4 weeks after starting physical therapy.  If his pain does not start to improve then I would consider diagnostic/therapeutic lumbar spine injections.  Since he did notice some pain relief with his tramadol, I will give him a refill to take as needed.  He will call me with questions or concerns prior to his follow-up visit. ? ?This note was dictated using Dragon naturally speaking software and may contain errors in syntax, spelling, or content which have not been identified prior to signing this  note.  ?

## 2021-10-21 NOTE — Patient Instructions (Addendum)
?  It was nice seeing you today Eric Holloway. ? ?I am going to put in a referral to [facility] physical therapy.  They will call you to set up your first appointment. ? ?I would like to see you in the office again 4 weeks after starting physical therapy.  In the meantime, I have refilled her tramadol to take as needed for pain. ?

## 2021-10-22 ENCOUNTER — Telehealth: Payer: Self-pay

## 2021-10-22 ENCOUNTER — Other Ambulatory Visit (HOSPITAL_COMMUNITY): Payer: Self-pay

## 2021-10-22 NOTE — Telephone Encounter (Signed)
Spoke with pt regarding his medication copay (farxiga). Pt says he was able to pickup a 90 day supply but it was very expensive for him. Informed pt to call [employer] and apply for Low Income Subsidy/ Extra Help. Let pt know to call me back here at the office if he is denied help so we can move forward getting him assistance with his medication. Pt expressed thanks and understanding. ?

## 2021-11-04 ENCOUNTER — Ambulatory Visit: Payer: Medicare Other | Attending: Sports Medicine

## 2021-11-04 DIAGNOSIS — R262 Difficulty in walking, not elsewhere classified: Secondary | ICD-10-CM | POA: Diagnosis not present

## 2021-11-04 DIAGNOSIS — M6281 Muscle weakness (generalized): Secondary | ICD-10-CM | POA: Insufficient documentation

## 2021-11-04 DIAGNOSIS — R29898 Other symptoms and signs involving the musculoskeletal system: Secondary | ICD-10-CM | POA: Diagnosis not present

## 2021-11-04 DIAGNOSIS — M25561 Pain in right knee: Secondary | ICD-10-CM | POA: Insufficient documentation

## 2021-11-04 DIAGNOSIS — G8929 Other chronic pain: Secondary | ICD-10-CM | POA: Insufficient documentation
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
## 2021-11-20 ENCOUNTER — Encounter: Payer: Medicare Other | Admitting: Physical Therapy

## 2021-11-26 ENCOUNTER — Other Ambulatory Visit: Payer: Self-pay | Admitting: Rheumatology

## 2021-11-26 ENCOUNTER — Other Ambulatory Visit: Payer: Self-pay | Admitting: *Deleted

## 2021-11-26 DIAGNOSIS — Z79899 Other long term (current) drug therapy: Secondary | ICD-10-CM

## 2021-11-26 NOTE — Telephone Encounter (Signed)
Next Visit: 01/22/2022  Last Visit: 08/13/2021  Last Fill: 09/02/2021  DX:  Rheumatoid arthritis involving multiple sites with positive rheumatoid factor   Current Dose per office note 08/13/2021: Methotrexate 2.5 mg 4 tablets twice a week on Mondays and Tuesdays only   Labs: 11/26/2021 CBC and CMP are normal.  Glucose is elevated.  Okay to refill MTX?

## 2021-11-26 NOTE — Telephone Encounter (Unsigned)
Patient is here for labwork and requested prescription refill of Methotrexate to be sent to [facility] at [facility].

## 2021-11-27 ENCOUNTER — Ambulatory Visit: Payer: Medicare Other

## 2021-11-27 ENCOUNTER — Telehealth: Payer: Self-pay

## 2021-11-27 LAB — COMPLETE METABOLIC PANEL WITH GFR
AG Ratio: 1.3 (calc) (ref 1.0–2.5)
ALT: 5 U/L — ABNORMAL LOW (ref 9–46)
AST: 12 U/L (ref 10–35)
Albumin: 3.7 g/dL (ref 3.6–5.1)
Alkaline phosphatase (APISO): 60 U/L (ref 35–144)
BUN: 13 mg/dL (ref 7–25)
CO2: 26 mmol/L (ref 20–32)
Calcium: 9.2 mg/dL (ref 8.6–10.3)
Chloride: 105 mmol/L (ref 98–110)
Creat: 0.99 mg/dL (ref 0.70–1.28)
Globulin: 2.8 g/dL (calc) (ref 1.9–3.7)
Glucose, Bld: 151 mg/dL — ABNORMAL HIGH (ref 65–99)
Potassium: 4.1 mmol/L (ref 3.5–5.3)
Sodium: 142 mmol/L (ref 135–146)
Total Bilirubin: 0.5 mg/dL (ref 0.2–1.2)
Total Protein: 6.5 g/dL (ref 6.1–8.1)
eGFR: 81 mL/min/{1.73_m2} (ref >=60)

## 2021-11-27 LAB — CBC WITH DIFFERENTIAL/PLATELET
Absolute Monocytes: 373 cells/uL (ref 200–950)
Basophils Absolute: 16 cells/uL (ref 0–200)
Basophils Relative: 0.2 %
Eosinophils Absolute: 32 cells/uL (ref 15–500)
Eosinophils Relative: 0.4 %
HCT: 42.8 % (ref 38.5–50.0)
Hemoglobin: 13.8 g/dL (ref 13.2–17.1)
Lymphs Abs: 1191 cells/uL (ref 850–3900)
MCH: 28.5 pg (ref 27.0–33.0)
MCHC: 32.2 g/dL (ref 32.0–36.0)
MCV: 88.4 fL (ref 80.0–100.0)
MPV: 11.5 fL (ref 7.5–12.5)
Monocytes Relative: 4.6 %
Neutro Abs: 6488 cells/uL (ref 1500–7800)
Neutrophils Relative %: 80.1 %
Platelets: 219 10*3/uL (ref 140–400)
RBC: 4.84 10*6/uL (ref 4.20–5.80)
RDW: 14.2 % (ref 11.0–15.0)
Total Lymphocyte: 14.7 %
WBC: 8.1 10*3/uL (ref 3.8–10.8)

## 2021-11-27 MED ORDER — METHOTREXATE SODIUM 2.5 MG PO TABS: ORAL_TABLET | ORAL | 0 refills | Status: DC

## 2021-11-27 NOTE — Telephone Encounter (Signed)
Called pt re: no show appt. Pt reports he has decided not to attend PT and requested to be Dced.

## 2021-11-27 NOTE — Progress Notes (Signed)
CBC and CMP are normal.  Glucose is elevated.
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
## 2021-12-03 ENCOUNTER — Telehealth: Payer: Self-pay | Admitting: Rheumatology

## 2021-12-03 ENCOUNTER — Other Ambulatory Visit: Payer: Self-pay

## 2021-12-03 DIAGNOSIS — E118 Type 2 diabetes mellitus with unspecified complications: Secondary | ICD-10-CM

## 2021-12-03 MED ORDER — METHOTREXATE SODIUM 2.5 MG PO TABS: ORAL_TABLET | ORAL | 0 refills | Status: DC

## 2021-12-03 MED ORDER — METFORMIN HCL 1000 MG PO TABS: ORAL_TABLET | ORAL | 3 refills | Status: DC

## 2021-12-03 NOTE — Telephone Encounter (Signed)
Prescription resent to requested pharmacy.

## 2021-12-03 NOTE — Telephone Encounter (Signed)
Spoke with patient and advised he will need to call around to the different pharmacies and when he finds one that has the medication to call the office and we will send his prescription to that pharmacy. Patient expressed understanding.

## 2021-12-03 NOTE — Telephone Encounter (Signed)
Patient requested his Methotrexate prescription be sent to [facility] at [facility].  Phone [phone]

## 2021-12-03 NOTE — Telephone Encounter (Signed)
Patient called the office stating he tried to fill his Methotrexate at [facility] and was told its on backorder for all [facility]. Patient states he is almost out of medication and needs it ASAP. Patient requests someone help him find where he can get it filled.

## 2021-12-04 ENCOUNTER — Other Ambulatory Visit: Payer: Self-pay | Admitting: Physician Assistant

## 2021-12-06 ENCOUNTER — Other Ambulatory Visit: Payer: Self-pay | Admitting: Physician Assistant

## 2021-12-08 ENCOUNTER — Other Ambulatory Visit: Payer: Self-pay | Admitting: Physician Assistant

## 2021-12-09 ENCOUNTER — Encounter: Payer: Self-pay | Admitting: *Deleted

## 2021-12-14 ENCOUNTER — Ambulatory Visit (HOSPITAL_COMMUNITY)
Admission: EM | Admit: 2021-12-14 | Discharge: 2021-12-14 | Disposition: A | Discharge status: Home / Self Care | Payer: Medicare Other | Attending: Internal Medicine | Admitting: Internal Medicine

## 2021-12-14 DIAGNOSIS — M778 Other enthesopathies, not elsewhere classified: Secondary | ICD-10-CM | POA: Diagnosis not present

## 2021-12-14 DIAGNOSIS — M25522 Pain in left elbow: Secondary | ICD-10-CM

## 2021-12-14 MED ORDER — ACETAMINOPHEN 325 MG PO TABS
975.0000 mg | ORAL_TABLET | Freq: Once | ORAL | Status: AC
  Administered 2021-12-14: 975 mg via ORAL

## 2021-12-14 MED ORDER — NAPROXEN 500 MG PO TABS: 500.0000 mg | ORAL_TABLET | Freq: Two times a day (BID) | ORAL | 0 refills | Status: DC

## 2021-12-14 MED ORDER — ACETAMINOPHEN 325 MG PO TABS
ORAL_TABLET | ORAL | Status: AC
  Filled 2021-12-14: qty 3

## 2021-12-14 MED ORDER — ACETAMINOPHEN 500 MG PO TABS: 1000.0000 mg | ORAL_TABLET | Freq: Four times a day (QID) | ORAL | 0 refills | Status: AC | PRN
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
## 2021-12-14 NOTE — ED Triage Notes (Signed)
Patient presents to Urgent Care with complaints of L elbow pain  since friday. Patient reports ice and heat to area with minimal improvement. Pt also reports diclfnac.

## 2021-12-14 NOTE — Discharge Instructions (Signed)
Take naproxen twice daily for the next 2 to 3 days then as needed.  Make sure to take this medication with food to avoid GI upset.  Do not take any other NSAID containing medications while taking this medicine (diclofenac, ibuprofen, Advil, Aleve, aspirin, or Goody powders).  Take Tylenol every 6 hours as needed for breakthrough pain.  You were given some in the clinic today, so your next dose may be tomorrow morning.   Apply ice to your elbow to decrease inflammation.  Rest your elbow for the next couple of days to decrease inflammation.  If you develop any new or worsening symptoms or do not improve in the next 2 to 3 days, please return.  If your symptoms are severe, please go to the emergency room.  Follow-up with your primary care provider for further evaluation and management of your symptoms as well as ongoing wellness visits.  I hope you feel better!

## 2021-12-31 ENCOUNTER — Other Ambulatory Visit: Payer: Self-pay | Admitting: Family Medicine

## 2021-12-31 DIAGNOSIS — I10 Essential (primary) hypertension: Secondary | ICD-10-CM
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
## 2022-01-16 ENCOUNTER — Other Ambulatory Visit: Payer: Self-pay | Admitting: Student

## 2022-01-16 ENCOUNTER — Other Ambulatory Visit: Payer: Self-pay

## 2022-01-16 DIAGNOSIS — E119 Type 2 diabetes mellitus without complications: Secondary | ICD-10-CM

## 2022-01-16 MED ORDER — DAPAGLIFLOZIN PROPANEDIOL 10 MG PO TABS: 10.0000 mg | ORAL_TABLET | Freq: Every day | ORAL | 0 refills | Status: AC

## 2022-01-16 NOTE — Telephone Encounter (Signed)
Patient calls nurse line requesting pharmacy change for [place].   Patient reports he can no longer afford the 90 day supply through [facility].   Patient requests we send 30 day supply to [facility] with additional refills.   I have cancelled remaining refills at [facility]. Please resend to [facility].

## 2022-01-16 NOTE — Progress Notes (Signed)
Refilled Eric Holloway and sent to [facility] at pt requestion

## 2022-01-22 ENCOUNTER — Encounter: Payer: Self-pay | Admitting: Rheumatology

## 2022-01-22 ENCOUNTER — Ambulatory Visit: Payer: Medicare Other | Admitting: Rheumatology

## 2022-01-22 VITALS — BP 145/74 | HR 72 | Ht 75.0 in | Wt 238.2 lb

## 2022-01-22 DIAGNOSIS — Z8639 Personal history of other endocrine, nutritional and metabolic disease: Secondary | ICD-10-CM | POA: Diagnosis not present

## 2022-01-22 DIAGNOSIS — M19071 Primary osteoarthritis, right ankle and foot: Secondary | ICD-10-CM | POA: Diagnosis not present

## 2022-01-22 DIAGNOSIS — G252 Other specified forms of tremor: Secondary | ICD-10-CM | POA: Diagnosis not present

## 2022-01-22 DIAGNOSIS — R519 Headache, unspecified: Secondary | ICD-10-CM | POA: Diagnosis not present

## 2022-01-22 DIAGNOSIS — M19041 Primary osteoarthritis, right hand: Secondary | ICD-10-CM

## 2022-01-22 DIAGNOSIS — Z8546 Personal history of malignant neoplasm of prostate: Secondary | ICD-10-CM

## 2022-01-22 DIAGNOSIS — M5030 Other cervical disc degeneration, unspecified cervical region: Secondary | ICD-10-CM | POA: Diagnosis not present

## 2022-01-22 DIAGNOSIS — Z79899 Other long term (current) drug therapy: Secondary | ICD-10-CM

## 2022-01-22 DIAGNOSIS — Z8679 Personal history of other diseases of the circulatory system: Secondary | ICD-10-CM | POA: Diagnosis not present

## 2022-01-22 DIAGNOSIS — M19042 Primary osteoarthritis, left hand: Secondary | ICD-10-CM

## 2022-01-22 DIAGNOSIS — M0579 Rheumatoid arthritis with rheumatoid factor of multiple sites without organ or systems involvement: Secondary | ICD-10-CM

## 2022-01-22 DIAGNOSIS — M5136 Other intervertebral disc degeneration, lumbar region: Secondary | ICD-10-CM | POA: Diagnosis not present

## 2022-01-22 DIAGNOSIS — M19072 Primary osteoarthritis, left ankle and foot: Secondary | ICD-10-CM

## 2022-01-22 DIAGNOSIS — G8929 Other chronic pain: Secondary | ICD-10-CM

## 2022-01-22 DIAGNOSIS — M25561 Pain in right knee: Secondary | ICD-10-CM

## 2022-01-22 DIAGNOSIS — Z87448 Personal history of other diseases of urinary system: Secondary | ICD-10-CM

## 2022-01-22 NOTE — Patient Instructions (Signed)
Standing Labs We placed an order today for your standing lab work.   Please have your standing labs drawn in August  and every 3 months  If possible, please have your labs drawn 2 weeks prior to your appointment so that the provider can discuss your results at your appointment.  Please note that you may see your imaging and lab results in MyChart before we have reviewed them. We may be awaiting multiple results to interpret others before contacting you. Please allow our office up to 72 hours to thoroughly review all of the results before contacting the office for clarification of your results.  We have open lab daily: Monday through Thursday from 1:30-4:30 PM and Friday from 1:30-4:00 PM at the office of Dr. Anuhea Gassner, [facility].   Please be advised, all patients with office appointments requiring lab work will take precedent over walk-in lab work.  If possible, please come for your lab work on Monday and Friday afternoons, as you may experience shorter wait times. The office is located at [address], [address], [place], [place] [address] No appointment is necessary.   Labs are drawn by [facility]. Please bring your co-pay at the time of your lab draw.  You may receive a bill from [facility] for your lab work.  Please note if you are on Hydroxychloroquine and and an order has been placed for a Hydroxychloroquine level, you will need to have it drawn 4 hours or more after your last dose.  If you wish to have your labs drawn at another location, please call the office 24 hours in advance to send orders.  If you have any questions regarding directions or hours of operation,  please call [phone].   As a reminder, please drink plenty of water prior to coming for your lab work. Thanks!   Vaccines You are taking a medication(s) that can suppress your immune system.  The following immunizations are recommended: Flu annually Covid-19  Td/Tdap (tetanus, diphtheria,  pertussis) every 10 years Pneumonia (Prevnar 15 then Pneumovax 23 at least 1 year apart.  Alternatively, can take Prevnar 20 without needing additional dose) Shingrix: 2 doses from 4 weeks to 6 months apart  Please check with your PCP to make sure you are up to date.   If you have signs or symptoms of an infection or start antibiotics: First, call your PCP for workup of your infection. Hold your medication through the infection, until you complete your antibiotics, and until symptoms resolve if you take the following: Injectable medication (Actemra, Benlysta, Cimzia, Cosentyx, Enbrel, Humira, Kevzara, Orencia, Remicade, Simponi, Stelara, Taltz, Tremfya) Methotrexate Leflunomide (Arava) Mycophenolate (Cellcept) Xeljanz, Olumiant, or Rinvoq  

## 2022-02-17 ENCOUNTER — Other Ambulatory Visit: Payer: Self-pay

## 2022-02-17 MED ORDER — DAPAGLIFLOZIN PROPANEDIOL 10 MG PO TABS: 10.0000 mg | ORAL_TABLET | Freq: Every day | ORAL | 3 refills | Status: DC

## 2022-02-17 NOTE — Telephone Encounter (Signed)
Patient calls nurse line requesting a refill on Farxiga.   I do not see this on medication list.   Will forward to PCP.   Eric Holloway

## 2022-02-19 DIAGNOSIS — H524 Presbyopia: Secondary | ICD-10-CM | POA: Diagnosis not present

## 2022-02-19 DIAGNOSIS — E1136 Type 2 diabetes mellitus with diabetic cataract: Secondary | ICD-10-CM | POA: Diagnosis not present

## 2022-02-19 DIAGNOSIS — H25813 Combined forms of age-related cataract, bilateral: Secondary | ICD-10-CM | POA: Diagnosis not present

## 2022-02-19 DIAGNOSIS — Z7984 Long term (current) use of oral hypoglycemic drugs: Secondary | ICD-10-CM | POA: Diagnosis not present

## 2022-02-24 ENCOUNTER — Other Ambulatory Visit: Payer: Self-pay | Admitting: Physician Assistant

## 2022-02-24 DIAGNOSIS — Z79899 Other long term (current) drug therapy: Secondary | ICD-10-CM

## 2022-02-24 NOTE — Telephone Encounter (Signed)
Next Visit: 07/08/2021  Last Visit: 01/22/2022  Last Fill: 12/03/2021  DX: Rheumatoid arthritis involving multiple sites with positive rheumatoid factor   Current Dose per office note 01/22/2022: Methotrexate 2.5 mg 4 tablets twice a week on Mondays and Tuesdays only   Labs: 11/26/2021 CBC and CMP are normal.  Glucose is elevated.  Patient advised he is due to update labs. Patient will come this afternoon to update labs.   Okay to refill MTX?

## 2022-02-25 ENCOUNTER — Other Ambulatory Visit: Payer: Self-pay | Admitting: *Deleted

## 2022-02-25 DIAGNOSIS — Z79899 Other long term (current) drug therapy: Secondary | ICD-10-CM | POA: Diagnosis not present

## 2022-02-26 LAB — CBC WITH DIFFERENTIAL/PLATELET
Absolute Monocytes: 592 cells/uL (ref 200–950)
Basophils Absolute: 17 cells/uL (ref 0–200)
Basophils Relative: 0.2 %
Eosinophils Absolute: 78 cells/uL (ref 15–500)
Eosinophils Relative: 0.9 %
HCT: 39.9 % (ref 38.5–50.0)
Hemoglobin: 12.8 g/dL — ABNORMAL LOW (ref 13.2–17.1)
Lymphs Abs: 1218 cells/uL (ref 850–3900)
MCH: 29 pg (ref 27.0–33.0)
MCHC: 32.1 g/dL (ref 32.0–36.0)
MCV: 90.3 fL (ref 80.0–100.0)
MPV: 10.7 fL (ref 7.5–12.5)
Monocytes Relative: 6.8 %
Neutro Abs: 6795 cells/uL (ref 1500–7800)
Neutrophils Relative %: 78.1 %
Platelets: 227 10*3/uL (ref 140–400)
RBC: 4.42 10*6/uL (ref 4.20–5.80)
RDW: 14.6 % (ref 11.0–15.0)
Total Lymphocyte: 14 %
WBC: 8.7 10*3/uL (ref 3.8–10.8)

## 2022-02-26 LAB — COMPLETE METABOLIC PANEL WITH GFR
AG Ratio: 1.5 (calc) (ref 1.0–2.5)
ALT: 9 U/L (ref 9–46)
AST: 13 U/L (ref 10–35)
Albumin: 3.8 g/dL (ref 3.6–5.1)
Alkaline phosphatase (APISO): 56 U/L (ref 35–144)
BUN: 12 mg/dL (ref 7–25)
CO2: 29 mmol/L (ref 20–32)
Calcium: 9.1 mg/dL (ref 8.6–10.3)
Chloride: 107 mmol/L (ref 98–110)
Creat: 1.05 mg/dL (ref 0.70–1.28)
Globulin: 2.5 g/dL (calc) (ref 1.9–3.7)
Glucose, Bld: 138 mg/dL — ABNORMAL HIGH (ref 65–99)
Potassium: 4.1 mmol/L (ref 3.5–5.3)
Sodium: 143 mmol/L (ref 135–146)
Total Bilirubin: 0.5 mg/dL (ref 0.2–1.2)
Total Protein: 6.3 g/dL (ref 6.1–8.1)
eGFR: 75 mL/min/{1.73_m2} (ref >=60)

## 2022-02-26 NOTE — Progress Notes (Signed)
Glucose is elevated, probably not a fasting lab.  Hemoglobin is low.  Patient should take multivitamin with iron.

## 2022-03-04 ENCOUNTER — Encounter: Payer: Self-pay | Admitting: Family Medicine

## 2022-03-04 LAB — HM DIABETES EYE EXAM

## 2022-03-28 ENCOUNTER — Other Ambulatory Visit: Payer: Self-pay | Admitting: Family Medicine

## 2022-03-28 DIAGNOSIS — I10 Essential (primary) hypertension: Secondary | ICD-10-CM

## 2022-04-15 ENCOUNTER — Other Ambulatory Visit: Payer: Self-pay | Admitting: Family Medicine

## 2022-04-15 DIAGNOSIS — E785 Hyperlipidemia, unspecified: Secondary | ICD-10-CM

## 2022-04-16 NOTE — Progress Notes (Signed)
    SUBJECTIVE:   CHIEF COMPLAINT / HPI:   Diabetes Patient's current diabetic medications include metformin and Farxiga.  Tolerating well without side effects.  Patient endorses compliance with these medications. CBG readings averaging in the range.  Patient's last A1c  5 months ago was 7.2 Denies abdominal pain, blurred vision, polyuria, polydipsia, hypoglycemia. Patient states they understand that diet and exercise can help with her diabetes.  Annual foot and eye exam is up to date. He is due for annual diabetic kidney evaluation  HCM -Patient report getting the flu shot on 04/2022 for his Job  PERTINENT  PMH / PSH: Reviewed  OBJECTIVE:   BP (!) 145/69   Pulse 63   Ht '6\' 3"'$  (1.905 m)   Wt 238 lb 4 oz (108.1 kg)   SpO2 100%   BMI 29.78 kg/m     Physical Exam General: Alert, well appearing, NAD, Cardiovascular: RRR, No Murmurs, Normal S2/S2 Respiratory: CTAB, No wheezing or Rales Abdomen: No distension or tenderness Extremities: No edema on extremities   Skin: Warm and dry  ASSESSMENT/PLAN:   T2DM (type 2 diabetes mellitus) ([place]) Patient's diabetes is well controlled with improving A1c of 6.7 today. Endorses compliance and tolerance to medication. Recommend continued medication as prescribed. Obtained lab to check for  microalbuminuria.      Alen Bleacher, MD [address]

## 2022-04-17 ENCOUNTER — Ambulatory Visit (INDEPENDENT_AMBULATORY_CARE_PROVIDER_SITE_OTHER): Payer: Medicare Other | Admitting: Student

## 2022-04-17 ENCOUNTER — Encounter: Payer: Self-pay | Admitting: Student

## 2022-04-17 VITALS — BP 145/69 | HR 63 | Ht 75.0 in | Wt 238.2 lb

## 2022-04-17 DIAGNOSIS — E119 Type 2 diabetes mellitus without complications: Secondary | ICD-10-CM | POA: Diagnosis not present

## 2022-04-17 LAB — POCT GLYCOSYLATED HEMOGLOBIN (HGB A1C): HbA1c, POC (controlled diabetic range): 6.7 % (ref 0.0–7.0)

## 2022-04-17 NOTE — Assessment & Plan Note (Signed)
Patient's diabetes is well controlled with improving A1c of 6.7 today. Endorses compliance and tolerance to medication. Recommend continued medication as prescribed. Obtained lab to check for  microalbuminuria.

## 2022-04-17 NOTE — Patient Instructions (Signed)
It was wonderful to meet you today. Thank you for allowing me to be a part of your care. Below is a short summary of what we discussed at your visit today:  Your A1c today and we will follow-up with results.  We also collected lab to check your kidney function today as well.  Follow-up in 3 months  Please bring all of your medications to every appointment!  If you have any questions or concerns, please do not hesitate to contact us via phone or MyChart message.   Alen Bleacher, MD [facility]

## 2022-04-18 LAB — MICROALBUMIN / CREATININE URINE RATIO
Creatinine, Urine: 69.4 mg/dL
Microalb/Creat Ratio: 50 mg/g creat — ABNORMAL HIGH (ref 0–29)
Microalbumin, Urine: 34.7 ug/mL

## 2022-04-27 ENCOUNTER — Telehealth: Payer: Self-pay | Admitting: Student

## 2022-04-27 ENCOUNTER — Encounter: Payer: Self-pay | Admitting: Student

## 2022-04-27 NOTE — Progress Notes (Unsigned)
[place]

## 2022-04-27 NOTE — Telephone Encounter (Signed)
Called patient to discuss his test result. Informed patient his test showed mild  Microalbinuria which is early sign of kidney injury likely due to diabetes or hypertension. Informed patient tighter glucose control and blood pressure management can improve this. Patient verbalized understanding and agreeable to plan.

## 2022-05-07 ENCOUNTER — Encounter (HOSPITAL_COMMUNITY): Payer: Self-pay

## 2022-05-07 ENCOUNTER — Ambulatory Visit (HOSPITAL_COMMUNITY)
Admission: EM | Admit: 2022-05-07 | Discharge: 2022-05-07 | Disposition: A | Discharge status: Home / Self Care | Payer: Medicare Other | Attending: Family Medicine | Admitting: Family Medicine

## 2022-05-07 DIAGNOSIS — H109 Unspecified conjunctivitis: Secondary | ICD-10-CM | POA: Diagnosis not present

## 2022-05-07 DIAGNOSIS — L03213 Periorbital cellulitis: Secondary | ICD-10-CM | POA: Diagnosis not present

## 2022-05-07 MED ORDER — GENTAMICIN SULFATE 0.3 % OP SOLN: 2.0000 [drp] | Freq: Three times a day (TID) | OPHTHALMIC | 0 refills | Status: AC

## 2022-05-07 MED ORDER — CEPHALEXIN 250 MG PO CAPS: 250.0000 mg | ORAL_CAPSULE | Freq: Three times a day (TID) | ORAL | 0 refills | Status: AC

## 2022-05-07 MED ORDER — TRAMADOL HCL 50 MG PO TABS: 50.0000 mg | ORAL_TABLET | Freq: Four times a day (QID) | ORAL | 0 refills | Status: AC | PRN

## 2022-05-07 NOTE — Discharge Instructions (Signed)
Put gentamicin eye drops in your right eye 3 times daily for 5 days.  Take cephalexin 250 mg--1 capsule 3 times daily for 5 days  Take tramadol 50 mg-- 1 tablet every 6 hours as needed for pain.  This medication can make you sleepy or dizzy  Call your eye doctor when you get home and make an appointment with them to followup on this issue.
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
## 2022-05-07 NOTE — ED Triage Notes (Signed)
Patient having irritation and redness to the right eye x 1 week. States it is becoming sore and feels like it is trying to close.   No known sick exposure and no one around with the same symptoms. No drainage, no blurred vision.

## 2022-05-18 ENCOUNTER — Other Ambulatory Visit: Payer: Self-pay | Admitting: Rheumatology

## 2022-05-18 NOTE — Telephone Encounter (Signed)
Next Visit: 07/08/2022  Last Visit: 01/22/2022  Last Fill: 02/24/2022  DX: Rheumatoid arthritis involving multiple sites with positive rheumatoid factor   Current Dose per office note on 01/22/2022: Methotrexate 2.5 mg 4 tablets twice a week on Mondays and Tuesdays only   Labs: 02/25/2022  Glucose is elevated, probably not a fasting lab.  Hemoglobin is low.  Patient should take multivitamin with iron.   Advised patient that he is due to update labs and he will update by this Thursday.   Okay to refill methotrexate?

## 2022-05-25 ENCOUNTER — Other Ambulatory Visit: Payer: Self-pay

## 2022-05-25 DIAGNOSIS — Z79899 Other long term (current) drug therapy: Secondary | ICD-10-CM

## 2022-05-26 LAB — COMPLETE METABOLIC PANEL WITH GFR
AG Ratio: 1.5 (calc) (ref 1.0–2.5)
ALT: 10 U/L (ref 9–46)
AST: 11 U/L (ref 10–35)
Albumin: 4 g/dL (ref 3.6–5.1)
Alkaline phosphatase (APISO): 55 U/L (ref 35–144)
BUN: 14 mg/dL (ref 7–25)
CO2: 29 mmol/L (ref 20–32)
Calcium: 9.4 mg/dL (ref 8.6–10.3)
Chloride: 105 mmol/L (ref 98–110)
Creat: 0.96 mg/dL (ref 0.70–1.28)
Globulin: 2.7 g/dL (calc) (ref 1.9–3.7)
Glucose, Bld: 141 mg/dL — ABNORMAL HIGH (ref 65–99)
Potassium: 4.2 mmol/L (ref 3.5–5.3)
Sodium: 142 mmol/L (ref 135–146)
Total Bilirubin: 0.6 mg/dL (ref 0.2–1.2)
Total Protein: 6.7 g/dL (ref 6.1–8.1)
eGFR: 84 mL/min/{1.73_m2} (ref >=60)

## 2022-05-26 LAB — CBC WITH DIFFERENTIAL/PLATELET
Absolute Monocytes: 467 cells/uL (ref 200–950)
Basophils Absolute: 22 cells/uL (ref 0–200)
Basophils Relative: 0.3 %
Eosinophils Absolute: 58 cells/uL (ref 15–500)
Eosinophils Relative: 0.8 %
HCT: 40.8 % (ref 38.5–50.0)
Hemoglobin: 13.6 g/dL (ref 13.2–17.1)
Lymphs Abs: 1307 cells/uL (ref 850–3900)
MCH: 29.2 pg (ref 27.0–33.0)
MCHC: 33.3 g/dL (ref 32.0–36.0)
MCV: 87.7 fL (ref 80.0–100.0)
MPV: 11.7 fL (ref 7.5–12.5)
Monocytes Relative: 6.4 %
Neutro Abs: 5446 cells/uL (ref 1500–7800)
Neutrophils Relative %: 74.6 %
Platelets: 224 10*3/uL (ref 140–400)
RBC: 4.65 10*6/uL (ref 4.20–5.80)
RDW: 14.1 % (ref 11.0–15.0)
Total Lymphocyte: 17.9 %
WBC: 7.3 10*3/uL (ref 3.8–10.8)

## 2022-05-26 NOTE — Progress Notes (Signed)
CBC and CMP are normal.  Glucose is elevated, probably not a fasting sample.

## 2022-06-15 ENCOUNTER — Other Ambulatory Visit: Payer: Self-pay | Admitting: Family Medicine
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
## 2022-07-03 ENCOUNTER — Other Ambulatory Visit: Payer: Self-pay | Admitting: Family Medicine

## 2022-07-03 DIAGNOSIS — Z8546 Personal history of malignant neoplasm of prostate: Secondary | ICD-10-CM

## 2022-07-08 ENCOUNTER — Ambulatory Visit: Payer: Medicare Other | Admitting: Rheumatology

## 2022-07-08 ENCOUNTER — Other Ambulatory Visit: Payer: Self-pay | Admitting: Student

## 2022-07-08 DIAGNOSIS — Z79899 Other long term (current) drug therapy: Secondary | ICD-10-CM

## 2022-07-08 DIAGNOSIS — G8929 Other chronic pain: Secondary | ICD-10-CM

## 2022-07-08 DIAGNOSIS — Z8679 Personal history of other diseases of the circulatory system: Secondary | ICD-10-CM

## 2022-07-08 DIAGNOSIS — M19071 Primary osteoarthritis, right ankle and foot: Secondary | ICD-10-CM

## 2022-07-08 DIAGNOSIS — M5030 Other cervical disc degeneration, unspecified cervical region: Secondary | ICD-10-CM

## 2022-07-08 DIAGNOSIS — Z8546 Personal history of malignant neoplasm of prostate: Secondary | ICD-10-CM

## 2022-07-08 DIAGNOSIS — M0579 Rheumatoid arthritis with rheumatoid factor of multiple sites without organ or systems involvement: Secondary | ICD-10-CM

## 2022-07-08 DIAGNOSIS — Z8639 Personal history of other endocrine, nutritional and metabolic disease: Secondary | ICD-10-CM

## 2022-07-08 DIAGNOSIS — G252 Other specified forms of tremor: Secondary | ICD-10-CM

## 2022-07-08 DIAGNOSIS — I10 Essential (primary) hypertension: Secondary | ICD-10-CM

## 2022-07-08 DIAGNOSIS — M5136 Other intervertebral disc degeneration, lumbar region: Secondary | ICD-10-CM

## 2022-07-08 DIAGNOSIS — M19042 Primary osteoarthritis, left hand: Secondary | ICD-10-CM
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
## 2022-07-13 ENCOUNTER — Ambulatory Visit: Payer: Medicare Other | Attending: Rheumatology | Admitting: Rheumatology

## 2022-07-13 ENCOUNTER — Encounter: Payer: Self-pay | Admitting: Rheumatology

## 2022-07-13 VITALS — BP 131/76 | HR 66 | Resp 17 | Ht 75.0 in | Wt 234.2 lb

## 2022-07-13 DIAGNOSIS — K1379 Other lesions of oral mucosa: Secondary | ICD-10-CM

## 2022-07-13 DIAGNOSIS — Z79899 Other long term (current) drug therapy: Secondary | ICD-10-CM

## 2022-07-13 DIAGNOSIS — M19071 Primary osteoarthritis, right ankle and foot: Secondary | ICD-10-CM | POA: Diagnosis not present

## 2022-07-13 DIAGNOSIS — Z8546 Personal history of malignant neoplasm of prostate: Secondary | ICD-10-CM

## 2022-07-13 DIAGNOSIS — M0579 Rheumatoid arthritis with rheumatoid factor of multiple sites without organ or systems involvement: Secondary | ICD-10-CM

## 2022-07-13 DIAGNOSIS — G252 Other specified forms of tremor: Secondary | ICD-10-CM | POA: Diagnosis not present

## 2022-07-13 DIAGNOSIS — M5136 Other intervertebral disc degeneration, lumbar region: Secondary | ICD-10-CM | POA: Diagnosis not present

## 2022-07-13 DIAGNOSIS — M25561 Pain in right knee: Secondary | ICD-10-CM

## 2022-07-13 DIAGNOSIS — M5030 Other cervical disc degeneration, unspecified cervical region: Secondary | ICD-10-CM

## 2022-07-13 DIAGNOSIS — Z8679 Personal history of other diseases of the circulatory system: Secondary | ICD-10-CM

## 2022-07-13 DIAGNOSIS — R519 Headache, unspecified: Secondary | ICD-10-CM

## 2022-07-13 DIAGNOSIS — Z8639 Personal history of other endocrine, nutritional and metabolic disease: Secondary | ICD-10-CM | POA: Diagnosis not present

## 2022-07-13 DIAGNOSIS — G8929 Other chronic pain: Secondary | ICD-10-CM

## 2022-07-13 DIAGNOSIS — M19072 Primary osteoarthritis, left ankle and foot: Secondary | ICD-10-CM

## 2022-07-13 DIAGNOSIS — M19041 Primary osteoarthritis, right hand: Secondary | ICD-10-CM

## 2022-07-13 DIAGNOSIS — M19042 Primary osteoarthritis, left hand: Secondary | ICD-10-CM

## 2022-07-13 MED ORDER — MAGIC MOUTHWASH: 5.0000 mL | Freq: Four times a day (QID) | ORAL | 0 refills | Status: DC

## 2022-07-13 MED ORDER — METHOTREXATE SODIUM 2.5 MG PO TABS: ORAL_TABLET | ORAL | 0 refills | Status: DC

## 2022-07-13 NOTE — Patient Instructions (Addendum)
Use the dose of methotrexate to 3 tablets by mouth on Monday and 3 tablets by mouth on Tuesday only   Standing Labs We placed an order today for your standing lab work.   Please have your standing labs drawn in February and every 3 months  Please have your labs drawn 2 weeks prior to your appointment so that the provider can discuss your lab results at your appointment.  Please note that you may see your imaging and lab results in [facility] before we have reviewed them. We will contact you once all results are reviewed. Please allow our office up to 72 hours to thoroughly review all of the results before contacting the office for clarification of your results.  Lab hours are:   Monday through Thursday from 8:00 am -12:30 pm and 1:00 pm-5:00 pm and Friday from 8:00 am-12:00 pm.  Please be advised, all patients with office appointments requiring lab work will take precedent over walk-in lab work.   Labs are drawn by [facility]. Please bring your co-pay at the time of your lab draw.  You may receive a bill from [facility] for your lab work.  Please note if you are on Hydroxychloroquine and and an order has been placed for a Hydroxychloroquine level, you will need to have it drawn 4 hours or more after your last dose.  If you wish to have your labs drawn at another location, please call the office 24 hours in advance so we can fax the orders.  The office is located at [address], [place], [place], [place] [address] No appointment is necessary.    If you have any questions regarding directions or hours of operation,  please call [phone].   As a reminder, please drink plenty of water prior to coming for your lab work. Thanks!   Vaccines You are taking a medication(s) that can suppress your immune system.  The following immunizations are recommended: Flu annually Covid-19  RSV Td/Tdap (tetanus, diphtheria, pertussis) every 10 years Pneumonia (Prevnar 15 then Pneumovax 23 at least 1  year apart.  Alternatively, can take Prevnar 20 without needing additional dose) Shingrix: 2 doses from 4 weeks to 6 months apart  Please check with your PCP to make sure you are up to date.   If you have signs or symptoms of an infection or start antibiotics: First, call your PCP for workup of your infection. Hold your medication through the infection, until you complete your antibiotics, and until symptoms resolve if you take the following: Injectable medication (Actemra, Benlysta, Cimzia, Cosentyx, Enbrel, Humira, Kevzara, Orencia, Remicade, Simponi, Stelara, Taltz, Tremfya) Methotrexate Leflunomide (Arava) Mycophenolate (Cellcept) Morrie Sheldon, Olumiant, or Rinvoq

## 2022-07-14 ENCOUNTER — Telehealth: Payer: Self-pay | Admitting: *Deleted

## 2022-07-14 NOTE — Telephone Encounter (Signed)
Magic mouthwash cannot be [facility] [address], must be filled at [facility], call  [facility] at [phone] with questions.

## 2022-07-15 NOTE — Telephone Encounter (Signed)
Spoke with patient and he is willing to have prescription sent to [facility] and pick it up from there. Will fax prescription to [facility] pharmacy.

## 2022-07-17 ENCOUNTER — Other Ambulatory Visit: Payer: Self-pay

## 2022-07-17 DIAGNOSIS — E118 Type 2 diabetes mellitus with unspecified complications: Secondary | ICD-10-CM

## 2022-07-17 MED ORDER — METFORMIN HCL 1000 MG PO TABS: ORAL_TABLET | ORAL | 3 refills | Status: DC

## 2022-07-20 ENCOUNTER — Other Ambulatory Visit: Payer: Self-pay

## 2022-07-20 MED ORDER — CARBIDOPA-LEVODOPA 25-100 MG PO TABS: 1.0000 | ORAL_TABLET | Freq: Three times a day (TID) | ORAL | 3 refills | Status: DC

## 2022-08-03 ENCOUNTER — Telehealth: Payer: Self-pay

## 2022-08-03 NOTE — Telephone Encounter (Signed)
Left message for patient to call back to schedule Medicare Annual Wellness Visit   Last AWV  09/29/16  Please schedule at anytime with [place] if patient calls the office back.      Any questions, please call me at [phone]

## 2022-08-04 ENCOUNTER — Other Ambulatory Visit: Payer: Self-pay | Admitting: Physician Assistant

## 2022-08-04 DIAGNOSIS — M0579 Rheumatoid arthritis with rheumatoid factor of multiple sites without organ or systems involvement: Secondary | ICD-10-CM

## 2022-08-04 NOTE — Telephone Encounter (Signed)
Next Visit: 10/22/2022  Last Visit: 07/13/2022  Last Fill: 05/18/2022  DX: Rheumatoid arthritis involving multiple sites with positive rheumatoid factor   Current Dose per office note on 07/13/2022: I advised him to reduce the dose of methotrexate to 3 tablets on Monday and 3 tablets on Tuesday   Labs: 05/25/2022 CBC and CMP are normal.  Glucose is elevated, probably not a fasting sample.   Okay to refill MTX?

## 2022-08-05 ENCOUNTER — Telehealth: Payer: Self-pay | Admitting: Family Medicine

## 2022-08-05 MED ORDER — CARBIDOPA-LEVODOPA 25-100 MG PO TABS: 1.0000 | ORAL_TABLET | Freq: Three times a day (TID) | ORAL | 3 refills | Status: DC

## 2022-08-05 NOTE — Telephone Encounter (Signed)
Rx sent to [facility] mail order pharmacy, left detailed message on voicemail Rx has been sent.

## 2022-08-05 NOTE — Telephone Encounter (Signed)
Pt is calling. Requesting a refill on carbidopa-levodopa (SINEMET IR) 25-100 MG tablet. Should be sent to [facility]

## 2022-08-19 ENCOUNTER — Other Ambulatory Visit: Payer: Self-pay | Admitting: *Deleted

## 2022-08-19 DIAGNOSIS — Z79899 Other long term (current) drug therapy: Secondary | ICD-10-CM | POA: Diagnosis not present

## 2022-08-20 LAB — CBC WITH DIFFERENTIAL/PLATELET
Absolute Monocytes: 490 cells/uL (ref 200–950)
Basophils Absolute: 18 cells/uL (ref 0–200)
Basophils Relative: 0.2 %
Eosinophils Absolute: 71 cells/uL (ref 15–500)
Eosinophils Relative: 0.8 %
HCT: 39.9 % (ref 38.5–50.0)
Hemoglobin: 12.9 g/dL — ABNORMAL LOW (ref 13.2–17.1)
Lymphs Abs: 1469 cells/uL (ref 850–3900)
MCH: 28.7 pg (ref 27.0–33.0)
MCHC: 32.3 g/dL (ref 32.0–36.0)
MCV: 88.7 fL (ref 80.0–100.0)
MPV: 11.2 fL (ref 7.5–12.5)
Monocytes Relative: 5.5 %
Neutro Abs: 6853 cells/uL (ref 1500–7800)
Neutrophils Relative %: 77 %
Platelets: 212 10*3/uL (ref 140–400)
RBC: 4.5 10*6/uL (ref 4.20–5.80)
RDW: 14.3 % (ref 11.0–15.0)
Total Lymphocyte: 16.5 %
WBC: 8.9 10*3/uL (ref 3.8–10.8)

## 2022-08-20 LAB — COMPLETE METABOLIC PANEL WITH GFR
AG Ratio: 1.5 (calc) (ref 1.0–2.5)
ALT: 7 U/L — ABNORMAL LOW (ref 9–46)
AST: 12 U/L (ref 10–35)
Albumin: 3.8 g/dL (ref 3.6–5.1)
Alkaline phosphatase (APISO): 52 U/L (ref 35–144)
BUN: 15 mg/dL (ref 7–25)
CO2: 29 mmol/L (ref 20–32)
Calcium: 9.1 mg/dL (ref 8.6–10.3)
Chloride: 106 mmol/L (ref 98–110)
Creat: 1 mg/dL (ref 0.70–1.28)
Globulin: 2.6 g/dL (calc) (ref 1.9–3.7)
Glucose, Bld: 112 mg/dL — ABNORMAL HIGH (ref 65–99)
Potassium: 4 mmol/L (ref 3.5–5.3)
Sodium: 142 mmol/L (ref 135–146)
Total Bilirubin: 0.4 mg/dL (ref 0.2–1.2)
Total Protein: 6.4 g/dL (ref 6.1–8.1)
eGFR: 80 mL/min/{1.73_m2} (ref >=60)

## 2022-08-20 NOTE — Progress Notes (Signed)
Hemoglobin is low and stable.  Patient should take multivitamin with iron.  CMP is stable.

## 2022-09-02 ENCOUNTER — Ambulatory Visit: Payer: Medicare Other | Admitting: Neurology

## 2022-09-02 ENCOUNTER — Encounter: Payer: Self-pay | Admitting: Neurology

## 2022-09-10 ENCOUNTER — Encounter: Payer: Self-pay | Admitting: Family Medicine

## 2022-09-10 ENCOUNTER — Ambulatory Visit (INDEPENDENT_AMBULATORY_CARE_PROVIDER_SITE_OTHER): Payer: Medicare Other | Admitting: Family Medicine

## 2022-09-10 VITALS — BP 133/60 | HR 60 | Ht 75.0 in | Wt 232.0 lb

## 2022-09-10 DIAGNOSIS — E119 Type 2 diabetes mellitus without complications: Secondary | ICD-10-CM

## 2022-09-10 DIAGNOSIS — D649 Anemia, unspecified: Secondary | ICD-10-CM | POA: Diagnosis not present

## 2022-09-10 DIAGNOSIS — I10 Essential (primary) hypertension: Secondary | ICD-10-CM | POA: Diagnosis not present

## 2022-09-10 DIAGNOSIS — E1159 Type 2 diabetes mellitus with other circulatory complications: Secondary | ICD-10-CM

## 2022-09-10 DIAGNOSIS — I152 Hypertension secondary to endocrine disorders: Secondary | ICD-10-CM

## 2022-09-10 LAB — POCT GLYCOSYLATED HEMOGLOBIN (HGB A1C): HbA1c, POC (controlled diabetic range): 6.3 % (ref 0.0–7.0)

## 2022-09-10 LAB — POCT HEMOGLOBIN: Hemoglobin: 12.3 g/dL (ref 11–14.6)

## 2022-09-10 MED ORDER — VALSARTAN 80 MG PO TABS: 80.0000 mg | ORAL_TABLET | Freq: Every day | ORAL | 0 refills | Status: DC

## 2022-09-10 NOTE — Assessment & Plan Note (Signed)
Very mild anemia noted on recent labs.  Repeat hemoglobin today in normal range improved with multivitamin.  No further workup needed at this time. - continue multivitamin

## 2022-09-10 NOTE — Progress Notes (Signed)
    SUBJECTIVE:   CHIEF COMPLAINT / HPI: Follow-up anemia   Patient had labs at her rheumatology office 1 month ago and noted to be mildly anemic with hemoglobin 12.9 and was advised to take a multivitamin with iron. He presents to the office today for follow-up for his anemia. Taking a multivitamin which does contain iron.  Asking if he will need a prescription iron supplement. Denies blood in stool, melena.  Last colonoscopy 2017 reports this was normal.  For diabetes takes metformin and empagliflozin.  Takes amlodipine 10 mg Home BP readings usually 120s-130s/70s  PERTINENT  PMH / PSH: Rheumatoid arthritis, T2DM, HTN  Patient Care Team: Alen Bleacher, MD as PCP - General (Family Medicine) Rutherford Guys, MD as Consulting Physician (Ophthalmology)   OBJECTIVE:   BP 133/60   Pulse 60   Ht '6\' 3"'$  (1.905 m)   Wt 232 lb (105.2 kg)   SpO2 100%   BMI 29.00 kg/m   Physical Exam Constitutional:      General: He is not in acute distress. Cardiovascular:     Rate and Rhythm: Normal rate and regular rhythm.  Pulmonary:     Effort: Pulmonary effort is normal. No respiratory distress.     Breath sounds: Normal breath sounds.  Abdominal:     General: Bowel sounds are normal.     Palpations: Abdomen is soft.     Tenderness: There is no abdominal tenderness.  Neurological:     Mental Status: He is alert.         09/10/2022   11:43 AM  Depression screen PHQ 2/9  Decreased Interest 0  Down, Depressed, Hopeless 0  PHQ - 2 Score 0  Altered sleeping 0  Tired, decreased energy 1  Change in appetite 0  Feeling bad or failure about yourself  0  Trouble concentrating 0  Moving slowly or fidgety/restless 0  Suicidal thoughts 0  PHQ-9 Score 1     {Show previous vital signs (optional):23777}  Last hemoglobin A1c Lab Results  Component Value Date   HGBA1C 6.3 09/10/2022   Lab Results  Component Value Date   WBC 8.9 08/19/2022   HGB 12.3 09/10/2022   HCT 39.9 08/19/2022    MCV 88.7 08/19/2022   PLT 212 08/19/2022       ASSESSMENT/PLAN:   Problem List Items Addressed This Visit       Cardiovascular and Mediastinum   Hypertension associated with diabetes ([place])    Well-controlled but given concomitant diabetes we will switch amlodipine to valsartan for renal protection especially given moderate microalbuminuria. - d/c amlodipine - start valsartan 80 mg daily - BMP in 2 weeks, future orders placed      Relevant Medications   valsartan (DIOVAN) 80 MG tablet     Endocrine   T2DM (type 2 diabetes mellitus) ([place]) - Primary    Well-controlled, at goal.  No change to current medications.      Relevant Medications   valsartan (DIOVAN) 80 MG tablet   Other Relevant Orders   HgB A1c (Completed)     Other   Anemia    Very mild anemia noted on recent labs.  Repeat hemoglobin today in normal range improved with multivitamin.  No further workup needed at this time. - continue multivitamin      Relevant Orders   POCT hemoglobin (Completed)       Return in about 6 months (around 03/13/2023) for diabetes.   Zola Button, MD [facility]

## 2022-09-10 NOTE — Assessment & Plan Note (Signed)
Well-controlled, at goal.  No change to current medications.

## 2022-09-10 NOTE — Patient Instructions (Addendum)
It was nice seeing you today!  Continue the multivitamin.  Start valsartan once a day for blood pressure and kidney protection. Stop the amlodipine.  Return in 2 weeks for lab visit.  Stay well, Zola Button, MD [place] [facility] [phone]  --  Make sure to check out at the front desk before you leave today.  Please arrive at least 15 minutes prior to your scheduled appointments.  If you had blood work today, I will send you a MyChart message or a letter if results are normal. Otherwise, I will give you a call.  If you had a referral placed, they will call you to set up an appointment. Please give [place] a call if you don't hear back in the next 2 weeks.  If you need additional refills before your next appointment, please call your pharmacy first.

## 2022-09-10 NOTE — Assessment & Plan Note (Signed)
Well-controlled but given concomitant diabetes we will switch amlodipine to valsartan for renal protection especially given moderate microalbuminuria. - d/c amlodipine - start valsartan 80 mg daily - BMP in 2 weeks, future orders placed

## 2022-10-04 ENCOUNTER — Other Ambulatory Visit: Payer: Self-pay | Admitting: Student

## 2022-10-04 DIAGNOSIS — I10 Essential (primary) hypertension: Secondary | ICD-10-CM

## 2022-10-05 ENCOUNTER — Other Ambulatory Visit: Payer: Self-pay | Admitting: Student

## 2022-10-05 DIAGNOSIS — Z8546 Personal history of malignant neoplasm of prostate: Secondary | ICD-10-CM
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
## 2022-10-22 ENCOUNTER — Ambulatory Visit: Payer: Medicare Other | Attending: Physician Assistant | Admitting: Physician Assistant

## 2022-10-22 ENCOUNTER — Encounter: Payer: Self-pay | Admitting: Physician Assistant

## 2022-10-22 VITALS — BP 139/71 | HR 67 | Resp 16 | Ht 75.0 in | Wt 230.0 lb

## 2022-10-22 DIAGNOSIS — R519 Headache, unspecified: Secondary | ICD-10-CM

## 2022-10-22 DIAGNOSIS — M0579 Rheumatoid arthritis with rheumatoid factor of multiple sites without organ or systems involvement: Secondary | ICD-10-CM | POA: Diagnosis not present

## 2022-10-22 DIAGNOSIS — Z8679 Personal history of other diseases of the circulatory system: Secondary | ICD-10-CM

## 2022-10-22 DIAGNOSIS — Z87448 Personal history of other diseases of urinary system: Secondary | ICD-10-CM

## 2022-10-22 DIAGNOSIS — M19041 Primary osteoarthritis, right hand: Secondary | ICD-10-CM

## 2022-10-22 DIAGNOSIS — Z8639 Personal history of other endocrine, nutritional and metabolic disease: Secondary | ICD-10-CM

## 2022-10-22 DIAGNOSIS — K1379 Other lesions of oral mucosa: Secondary | ICD-10-CM | POA: Diagnosis not present

## 2022-10-22 DIAGNOSIS — M5030 Other cervical disc degeneration, unspecified cervical region: Secondary | ICD-10-CM

## 2022-10-22 DIAGNOSIS — M19071 Primary osteoarthritis, right ankle and foot: Secondary | ICD-10-CM

## 2022-10-22 DIAGNOSIS — M19042 Primary osteoarthritis, left hand: Secondary | ICD-10-CM

## 2022-10-22 DIAGNOSIS — M5136 Other intervertebral disc degeneration, lumbar region: Secondary | ICD-10-CM

## 2022-10-22 DIAGNOSIS — Z8546 Personal history of malignant neoplasm of prostate: Secondary | ICD-10-CM

## 2022-10-22 DIAGNOSIS — G8929 Other chronic pain: Secondary | ICD-10-CM

## 2022-10-22 DIAGNOSIS — G252 Other specified forms of tremor: Secondary | ICD-10-CM

## 2022-10-22 DIAGNOSIS — M19072 Primary osteoarthritis, left ankle and foot: Secondary | ICD-10-CM

## 2022-10-22 DIAGNOSIS — Z79899 Other long term (current) drug therapy: Secondary | ICD-10-CM

## 2022-10-22 DIAGNOSIS — M25561 Pain in right knee: Secondary | ICD-10-CM

## 2022-10-22 MED ORDER — METHOTREXATE SODIUM 2.5 MG PO TABS: ORAL_TABLET | ORAL | 0 refills | Status: DC

## 2022-10-22 NOTE — Patient Instructions (Addendum)
Standing Labs We placed an order today for your standing lab work.   Please have your standing labs drawn in mid-May and every 3 months   Please have your labs drawn 2 weeks prior to your appointment so that the provider can discuss your lab results at your appointment, if possible.  Please note that you may see your imaging and lab results in MyChart before we have reviewed them. We will contact you once all results are reviewed. Please allow our office up to 72 hours to thoroughly review all of the results before contacting the office for clarification of your results.  WALK-IN LAB HOURS  Monday through Thursday from 8:00 am -12:30 pm and 1:00 pm-5:00 pm and Friday from 8:00 am-12:00 pm.  Patients with office visits requiring labs will be seen before walk-in labs.  You may encounter longer than normal wait times. Please allow additional time. Wait times may be shorter on  Monday and Thursday afternoons.  We do not book appointments for walk-in labs. We appreciate your patience and understanding with our staff.   Labs are drawn by [facility]. Please bring your co-pay at the time of your lab draw.  You may receive a bill from [facility] for your lab work.  Please note if you are on Hydroxychloroquine and and an order has been placed for a Hydroxychloroquine level,  you will need to have it drawn 4 hours or more after your last dose.  If you wish to have your labs drawn at another location, please call the office 24 hours in advance so we can fax the orders.  The office is located at [address], [address], [place], [place] [address]   If you have any questions regarding directions or hours of operation,  please call [phone].   As a reminder, please drink plenty of water prior to coming for your lab work. Thanks!  

## 2022-11-03 ENCOUNTER — Telehealth: Payer: Self-pay | Admitting: Student

## 2022-11-03 NOTE — Telephone Encounter (Signed)
Called patient to schedule Medicare Annual Wellness Visit (AWV). Left message for patient to call back and schedule Medicare Annual Wellness Visit (AWV).  Last date of AWV: 09/29/2016  Please schedule an AWVS appointment at any time with [facility] VISIT.  If any questions, please contact me at [phone].    Thank you,  [facility] Support [facility] Direct dial  [phone]

## 2022-11-17 ENCOUNTER — Ambulatory Visit: Payer: Medicare Other | Admitting: Neurology

## 2022-11-27 ENCOUNTER — Ambulatory Visit (INDEPENDENT_AMBULATORY_CARE_PROVIDER_SITE_OTHER): Payer: Medicare Other

## 2022-11-27 VITALS — Ht 75.0 in | Wt 230.0 lb

## 2022-11-27 DIAGNOSIS — Z0000 Encounter for general adult medical examination without abnormal findings: Secondary | ICD-10-CM

## 2022-11-27 DIAGNOSIS — E119 Type 2 diabetes mellitus without complications: Secondary | ICD-10-CM

## 2022-11-27 DIAGNOSIS — Z0100 Encounter for examination of eyes and vision without abnormal findings: Secondary | ICD-10-CM
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
## 2022-11-27 NOTE — Patient Instructions (Addendum)
Eric Holloway , Thank you for taking time to come for your Medicare Wellness Visit. I appreciate your ongoing commitment to your health goals. Please review the following plan we discussed and let me know if I can assist you in the future.   These are the goals we discussed:  Goals      Exercise 5x per week (10-15 min per time)     Discussed using treadmill at home     Remain active and independent        This is a list of the screening recommended for you and due dates:  Health Maintenance  Topic Date Due   DTaP/Tdap/Td vaccine (1 - Tdap) Never done   COVID-19 Vaccine (5 - 2023-24 season) 03/06/2022   Complete foot exam   10/21/2022   Flu Shot  02/04/2023   Eye exam for diabetics  03/05/2023   Hemoglobin A1C  03/13/2023   Yearly kidney health urinalysis for diabetes  04/18/2023   Yearly kidney function blood test for diabetes  08/20/2023   Medicare Annual Wellness Visit  11/27/2023   Colon Cancer Screening  06/25/2026   Pneumonia Vaccine  Completed   Hepatitis C Screening  Completed   Zoster (Shingles) Vaccine  Completed   HPV Vaccine  Aged Out    Advanced directives: Information on Advanced Care Planning can be found at [facility] Advance Health Care Directives Advance Health Care Directives ([email or web address]  Please bring a copy of your health care power of attorney and living will to the office to be added to your chart at your convenience.   Conditions/risks identified: Aim for 30 minutes of exercise or brisk walking, 6-8 glasses of water, and 5 servings of fruits and vegetables each day.   Next appointment: Follow up in one year for your annual wellness visit.   Preventive Care 57 Years and Older, Male  Preventive care refers to lifestyle choices and visits with your health care provider that can promote health and wellness. What does preventive care include? A yearly physical exam. This is also called an annual well check. Dental exams once or twice a  year. Routine eye exams. Ask your health care provider how often you should have your eyes checked. Personal lifestyle choices, including: Daily care of your teeth and gums. Regular physical activity. Eating a healthy diet. Avoiding tobacco and drug use. Limiting alcohol use. Practicing safe sex. Taking low doses of aspirin every day. Taking vitamin and mineral supplements as recommended by your health care provider. What happens during an annual well check? The services and screenings done by your health care provider during your annual well check will depend on your age, overall health, lifestyle risk factors, and family history of disease. Counseling  Your health care provider may ask you questions about your: Alcohol use. Tobacco use. Drug use. Emotional well-being. Home and relationship well-being. Sexual activity. Eating habits. History of falls. Memory and ability to understand (cognition). Work and work Astronomer. Screening  You may have the following tests or measurements: Height, weight, and BMI. Blood pressure. Lipid and cholesterol levels. These may be checked every 5 years, or more frequently if you are over 25 years old. Skin check. Lung cancer screening. You may have this screening every year starting at age 61 if you have a 30-pack-year history of smoking and currently smoke or have quit within the past 15 years. Fecal occult blood test (FOBT) of the stool. You may have this test every year starting at age  50. Flexible sigmoidoscopy or colonoscopy. You may have a sigmoidoscopy every 5 years or a colonoscopy every 10 years starting at age 14. Prostate cancer screening. Recommendations will vary depending on your family history and other risks. Hepatitis C blood test. Hepatitis B blood test. Sexually transmitted disease (STD) testing. Diabetes screening. This is done by checking your blood sugar (glucose) after you have not eaten for a while (fasting). You may  have this done every 1-3 years. Abdominal aortic aneurysm (AAA) screening. You may need this if you are a current or former smoker. Osteoporosis. You may be screened starting at age 34 if you are at high risk. Talk with your health care provider about your test results, treatment options, and if necessary, the need for more tests. Vaccines  Your health care provider may recommend certain vaccines, such as: Influenza vaccine. This is recommended every year. Tetanus, diphtheria, and acellular pertussis (Tdap, Td) vaccine. You may need a Td booster every 10 years. Zoster vaccine. You may need this after age 13. Pneumococcal 13-valent conjugate (PCV13) vaccine. One dose is recommended after age 32. Pneumococcal polysaccharide (PPSV23) vaccine. One dose is recommended after age 60. Talk to your health care provider about which screenings and vaccines you need and how often you need them. This information is not intended to replace advice given to you by your health care provider. Make sure you discuss any questions you have with your health care provider. Document Released: 07/19/2015 Document Revised: 03/11/2016 Document Reviewed: 04/23/2015 Elsevier Interactive Patient Education  2017 [employer].  Fall Prevention in the Home Falls can cause injuries. They can happen to people of all ages. There are many things you can do to make your home safe and to help prevent falls. What can I do on the outside of my home? Regularly fix the edges of walkways and driveways and fix any cracks. Remove anything that might make you trip as you walk through a door, such as a raised step or threshold. Trim any bushes or trees on the path to your home. Use bright outdoor lighting. Clear any walking paths of anything that might make someone trip, such as rocks or tools. Regularly check to see if handrails are loose or broken. Make sure that both sides of any steps have handrails. Any raised decks and porches  should have guardrails on the edges. Have any leaves, snow, or ice cleared regularly. Use sand or salt on walking paths during winter. Clean up any spills in your garage right away. This includes oil or grease spills. What can I do in the bathroom? Use night lights. Install grab bars by the toilet and in the tub and shower. Do not use towel bars as grab bars. Use non-skid mats or decals in the tub or shower. If you need to sit down in the shower, use a plastic, non-slip stool. Keep the floor dry. Clean up any water that spills on the floor as soon as it happens. Remove soap buildup in the tub or shower regularly. Attach bath mats securely with double-sided non-slip rug tape. Do not have throw rugs and other things on the floor that can make you trip. What can I do in the bedroom? Use night lights. Make sure that you have a light by your bed that is easy to reach. Do not use any sheets or blankets that are too big for your bed. They should not hang down onto the floor. Have a firm chair that has side arms. You can use  this for support while you get dressed. Do not have throw rugs and other things on the floor that can make you trip. What can I do in the kitchen? Clean up any spills right away. Avoid walking on wet floors. Keep items that you use a lot in easy-to-reach places. If you need to reach something above you, use a strong step stool that has a grab bar. Keep electrical cords out of the way. Do not use floor polish or wax that makes floors slippery. If you must use wax, use non-skid floor wax. Do not have throw rugs and other things on the floor that can make you trip. What can I do with my stairs? Do not leave any items on the stairs. Make sure that there are handrails on both sides of the stairs and use them. Fix handrails that are broken or loose. Make sure that handrails are as long as the stairways. Check any carpeting to make sure that it is firmly attached to the stairs.  Fix any carpet that is loose or worn. Avoid having throw rugs at the top or bottom of the stairs. If you do have throw rugs, attach them to the floor with carpet tape. Make sure that you have a light switch at the top of the stairs and the bottom of the stairs. If you do not have them, ask someone to add them for you. What else can I do to help prevent falls? Wear shoes that: Do not have high heels. Have rubber bottoms. Are comfortable and fit you well. Are closed at the toe. Do not wear sandals. If you use a stepladder: Make sure that it is fully opened. Do not climb a closed stepladder. Make sure that both sides of the stepladder are locked into place. Ask someone to hold it for you, if possible. Clearly mark and make sure that you can see: Any grab bars or handrails. First and last steps. Where the edge of each step is. Use tools that help you move around (mobility aids) if they are needed. These include: Canes. Walkers. Scooters. Crutches. Turn on the lights when you go into a dark area. Replace any light bulbs as soon as they burn out. Set up your furniture so you have a clear path. Avoid moving your furniture around. If any of your floors are uneven, fix them. If there are any pets around you, be aware of where they are. Review your medicines with your doctor. Some medicines can make you feel dizzy. This can increase your chance of falling. Ask your doctor what other things that you can do to help prevent falls. This information is not intended to replace advice given to you by your health care provider. Make sure you discuss any questions you have with your health care provider. Document Released: 04/18/2009 Document Revised: 11/28/2015 Document Reviewed: 07/27/2014 Elsevier Interactive Patient Education  2017 [employer].

## 2022-12-06 ENCOUNTER — Other Ambulatory Visit: Payer: Self-pay | Admitting: Family Medicine

## 2022-12-06 DIAGNOSIS — I10 Essential (primary) hypertension: Secondary | ICD-10-CM

## 2022-12-09 ENCOUNTER — Ambulatory Visit (INDEPENDENT_AMBULATORY_CARE_PROVIDER_SITE_OTHER): Payer: Medicare Other | Admitting: Family Medicine

## 2022-12-09 VITALS — BP 168/70 | HR 61 | Ht 75.0 in | Wt 234.4 lb

## 2022-12-09 DIAGNOSIS — E1159 Type 2 diabetes mellitus with other circulatory complications: Secondary | ICD-10-CM | POA: Diagnosis not present

## 2022-12-09 DIAGNOSIS — I10 Essential (primary) hypertension: Secondary | ICD-10-CM

## 2022-12-09 DIAGNOSIS — I152 Hypertension secondary to endocrine disorders: Secondary | ICD-10-CM | POA: Diagnosis not present

## 2022-12-09 MED ORDER — VALSARTAN-HYDROCHLOROTHIAZIDE 80-12.5 MG PO TABS
0.5000 | ORAL_TABLET | Freq: Every day | ORAL | 1 refills | Status: DC
Start: 2022-12-09 — End: 2023-02-09

## 2022-12-09 NOTE — Patient Instructions (Signed)
It was wonderful to see you today. Thank you for allowing me to be a part of your care. Below is a short summary of what we discussed at your visit today:  Blood pressure Given the swelling in your feet, I am hesitant to restart amlodipine as this can make it worse. Unfortunately, the combination I am hoping for with amlodipine and HCTZ does not exist. I have written a combination pill with both valsartan and HCTZ to help control blood pressure and get the fluid off your legs. Take one half pill a day Blood work today to check in on your kidneys. Come back in a week for your already scheduled appointment.  At that time, we will see if the BP is working well for you and get more blood work.    Please bring all of your medications to every appointment! If you have any questions or concerns, please do not hesitate to contact us via phone or MyChart message.   Fayette Pho, MD

## 2022-12-09 NOTE — Progress Notes (Signed)
SUBJECTIVE:   CHIEF COMPLAINT / HPI:   HTN Back in early March, was switched off amlodipine and started on valsartan by Dr. Wynelle Link for renal protection with T2DM and microalbuminuria.   Today, patient reports uncontrolled BP and new headaches after starting the valsartan. He wonders if this is a side effect of the new medication.   Does not check his blood pressure at home, but does report it has been elevated at other offices when they check. Does get dizzy with position changes, such as standing up.   BP Readings from Last 3 Encounters:  12/09/22 (!) 168/70  10/22/22 139/71  09/10/22 133/60    PERTINENT  PMH / PSH:  Patient Active Problem List   Diagnosis Date Noted   Anemia 09/10/2022   Mallet finger of left hand 01/20/2020   Parkinson's disease 12/14/2019   Syncope 12/14/2018   COVID-19 virus infection 12/14/2018   Plantar fasciitis 08/22/2018   Nuclear sclerotic cataract of both eyes 12/15/2017   Cortical age-related cataract of both eyes 12/15/2017   Cyst of right kidney 08/11/2017   DJD (degenerative joint disease), cervical 01/25/2017   DDD (degenerative disc disease), lumbar 01/25/2017   Tendinopathy of right shoulder 12/21/2016   Dizziness 06/10/2016   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   Erectile dysfunction following radiation therapy 04/17/2015   Allergic rhinitis 08/09/2014   LOW BACK PAIN, CHRONIC 07/09/2009   OBESITY 09/07/2008   Hyperlipidemia 03/26/2008   PARESTHESIA 04/20/2007   T2DM (type 2 diabetes mellitus) (HCC) 09/08/2006   Personal history of malignant neoplasm of prostate 09/08/2006   ONYCHOMYCOSIS 09/02/2006   Hypertension associated with diabetes (HCC) 09/02/2006   Rheumatoid arthritis (HCC) 09/02/2006    OBJECTIVE:   BP (!) 168/70   Pulse 61   Ht 6\' 3"  (1.905 m)   Wt 234 lb 6.4 oz (106.3 kg)   SpO2 100%   BMI 29.30 kg/m     PHQ-9:     12/09/2022    9:35 AM 11/27/2022    1:55 PM 09/10/2022   11:43 AM  Depression screen PHQ 2/9   Decreased Interest 0 0 0  Down, Depressed, Hopeless 0 0 0  PHQ - 2 Score 0 0 0  Altered sleeping 0  0  Tired, decreased energy 0  1  Change in appetite 0  0  Feeling bad or failure about yourself  0  0  Trouble concentrating 0  0  Moving slowly or fidgety/restless 0  0  Suicidal thoughts 0  0  PHQ-9 Score 0  1  Difficult doing work/chores Not difficult at all      Physical Exam General: Awake, alert, oriented Cardiovascular: Regular rate and rhythm, S1 and S2 present, no murmurs auscultated Respiratory: Lung fields clear to auscultation bilaterally Extremities: 2+ BLE edema to level of mid shin, 1+ BLE edema at knee  ASSESSMENT/PLAN:   Hypertension associated with diabetes (HCC) Systolic hypertension in clinic today on valsartan monotherapy. Patient would prefer to return to his previous amlodipine, however I am hesitant to do so because of his current BLE edema. No history of gout found in chart, so diuretic with HCTZ would be agreeable choice. Agree with needing ACE or ARB for renal protection ideally. Unfortunately, there is no amlodipine-HCTZ combo available and Epic indicates the amlodipine-valsartan-HCTZ combo is not covered by his insurance.   Will start valsartan-HCTZ 80-12.5 mg and instruct patient to take 1/2 tab daily. Patient to follow up in 1 week for BP recheck and BMP. Do  want to take care with reported dizziness, given normal diastolic pressures. Unclear if systolic hypertension is causing his headaches.    Fayette Pho, MD [facility]

## 2022-12-10 ENCOUNTER — Ambulatory Visit: Payer: Medicare Other | Admitting: Student

## 2022-12-10 LAB — BASIC METABOLIC PANEL
BUN/Creatinine Ratio: 13 (ref 10–24)
BUN: 13 mg/dL (ref 8–27)
CO2: 25 mmol/L (ref 20–29)
Calcium: 9.3 mg/dL (ref 8.6–10.2)
Chloride: 103 mmol/L (ref 96–106)
Creatinine, Ser: 1.01 mg/dL (ref 0.76–1.27)
Glucose: 103 mg/dL — ABNORMAL HIGH (ref 70–99)
Potassium: 4.1 mmol/L (ref 3.5–5.2)
Sodium: 142 mmol/L (ref 134–144)
eGFR: 79 mL/min/{1.73_m2} (ref >59)

## 2022-12-11 ENCOUNTER — Encounter: Payer: Self-pay | Admitting: Family Medicine

## 2022-12-11 NOTE — Assessment & Plan Note (Addendum)
Systolic hypertension in clinic today on valsartan monotherapy. Patient would prefer to return to his previous amlodipine, however I am hesitant to do so because of his current BLE edema. No history of gout found in chart, so diuretic with HCTZ would be agreeable choice. Agree with needing ACE or ARB for renal protection ideally. Unfortunately, there is no amlodipine-HCTZ combo available and Epic indicates the amlodipine-valsartan-HCTZ combo is not covered by his insurance.   Will start valsartan-HCTZ 80-12.5 mg and instruct patient to take 1/2 tab daily. Patient to follow up in 1 week for BP recheck and BMP. Do want to take care with reported dizziness, given normal diastolic pressures. Unclear if systolic hypertension is causing his headaches.

## 2022-12-16 ENCOUNTER — Encounter: Payer: Self-pay | Admitting: Neurology

## 2022-12-16 ENCOUNTER — Ambulatory Visit: Payer: Medicare Other | Admitting: Neurology

## 2022-12-16 VITALS — BP 125/63 | HR 66 | Ht 75.0 in | Wt 230.0 lb

## 2022-12-16 DIAGNOSIS — G8929 Other chronic pain: Secondary | ICD-10-CM | POA: Diagnosis not present

## 2022-12-16 DIAGNOSIS — M545 Low back pain, unspecified: Secondary | ICD-10-CM

## 2022-12-16 DIAGNOSIS — G20A2 Parkinson's disease without dyskinesia, with fluctuations: Secondary | ICD-10-CM

## 2022-12-16 MED ORDER — CARBIDOPA-LEVODOPA 25-100 MG PO TABS: 1.0000 | ORAL_TABLET | Freq: Four times a day (QID) | ORAL | 3 refills | Status: DC
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
## 2022-12-17 ENCOUNTER — Encounter: Payer: Self-pay | Admitting: Student

## 2022-12-17 ENCOUNTER — Ambulatory Visit (INDEPENDENT_AMBULATORY_CARE_PROVIDER_SITE_OTHER): Payer: Medicare Other | Admitting: Student

## 2022-12-17 DIAGNOSIS — E1159 Type 2 diabetes mellitus with other circulatory complications: Secondary | ICD-10-CM

## 2022-12-17 DIAGNOSIS — E119 Type 2 diabetes mellitus without complications: Secondary | ICD-10-CM

## 2022-12-17 DIAGNOSIS — M5136 Other intervertebral disc degeneration, lumbar region: Secondary | ICD-10-CM

## 2022-12-17 DIAGNOSIS — I152 Hypertension secondary to endocrine disorders: Secondary | ICD-10-CM

## 2022-12-17 LAB — POCT GLYCOSYLATED HEMOGLOBIN (HGB A1C): HbA1c, POC (prediabetic range): 6.4 % (ref 5.7–6.4)

## 2022-12-17 MED ORDER — BACLOFEN 10 MG PO TABS
10.0000 mg | ORAL_TABLET | Freq: Three times a day (TID) | ORAL | 0 refills | Status: AC
Start: 2022-12-17 — End: ?

## 2022-12-17 MED ORDER — DICLOFENAC SODIUM 1 % EX GEL
4.0000 g | Freq: Four times a day (QID) | CUTANEOUS | 1 refills | Status: AC
Start: 2022-12-17 — End: ?

## 2022-12-17 NOTE — Assessment & Plan Note (Signed)
Still complain of back pain which is a chronic issue. No longer on muscle relaxer or taking any meds.  No red flag symptoms, denies incontinence or saddle paresthesia. On exam  has 5/5 muscle strength on all extremities with sensation intact. Suspect his back pain could be worsening by his parkinson's diagnosis. -Recommend  Voltaren gel -Rx Baclofen PRN  -Will consider orthopedic referral if no improvement in a month

## 2022-12-17 NOTE — Patient Instructions (Addendum)
It was wonderful to see you today. Thank you for allowing me to be a part of your care. Below is a short summary of what we discussed at your visit today:  Your A1c today is 6.4.  Today we are obtaining labs to check your kidney function and your cholesterol levels.  I have sent in referral to an ophthalmologist for your annual diabetes eye exam.  I have sent in a muscle relaxer for your back pain and Voltaren gel which you can apply on the affected area in your back.  If your back pain continues in the months please follow-up so we can send referral to an orthopedic.   If you have any questions or concerns, please do not hesitate to contact us via phone or MyChart message.   Jerre Simon, MD [facility]

## 2022-12-17 NOTE — Assessment & Plan Note (Signed)
Well-controlled type 2 diabetes with A1c of 6.4 today.  Endorses good compliance and tolerance to current medication. Foot exam was normal today. -Completed while diabetic foot exam -Placed referral for ophthalmology for annual eye exam -Obtained A1c -Next exercising and dieting -Obtain lab for microalbuminuria, Lipid panel.

## 2022-12-17 NOTE — Progress Notes (Signed)
    SUBJECTIVE:   CHIEF COMPLAINT / HPI:   Diabetes, Type 2 - Last A1c -6.3 from 3 months ago. - Medications: Metformin 1000 mg twice daily, Farxiga 10 mg daily - Compliance: Good - Checking BG at home: intermittently 110-125 - Diet: Regular  - Exercise: active and walk  - Eye exam: Due - Foot exam: Due  - Microalbumin: Due - Statin: Lipitor 40 mg daily. - Denies symptoms of hypoglycemia, polyuria, polydipsia, numbness extremities, foot ulcers/trauma   Back Pain Patient report he's still having back pain which is a chronic problem and has been told he has arthritis in his back. Pain is intermittent and improves with tylenol. It doesn't radiate and has improved in the last week. No pelvic paresthesia or LE numbness or tingling. No recent trauma to the back.  PERTINENT  PMH / PSH: Reviewed   OBJECTIVE:   BP (!) 133/52   Pulse 70   Ht 6\' 3"  (1.905 m)   Wt 228 lb 12.8 oz (103.8 kg)   SpO2 100%   BMI 28.60 kg/m    Physical Exam General: Alert, well appearing, NAD Cardiovascular: RRR, No Murmurs, Normal S2/S2 Respiratory: CTAB, No wheezing or Rales Abdomen: No distension or tenderness Extremities: Foot without any lesion bilaterally, good pedal pulse and sensation intact. BLE edema present L > R .  Normal strength in bilateral lower extremities with sensations intact.   Neuro: Notable Bilateral UE tremors at rest.  ASSESSMENT/PLAN:   T2DM (type 2 diabetes mellitus) (HCC) Well-controlled type 2 diabetes with A1c of 6.4 today.  Endorses good compliance and tolerance to current medication. Foot exam was normal today. -Completed while diabetic foot exam -Placed referral for ophthalmology for annual eye exam -Obtained A1c -Next exercising and dieting -Obtain lab for microalbuminuria, Lipid panel.  DDD (degenerative disc disease), lumbar Still complain of back pain which is a chronic issue. No longer on muscle relaxer or taking any meds.  No red flag symptoms, denies  incontinence or saddle paresthesia. On exam  has 5/5 muscle strength on all extremities with sensation intact. Suspect his back pain could be worsening by his parkinson's diagnosis. -Recommend  Voltaren gel -Rx Baclofen PRN  -Will consider orthopedic referral if no improvement in a month  Lower Extremity edema Exam patient has lower extremity edema L > R. Suspect possible venous stasis in the absence of any inciting medication and normal kidney function from recent BMP. -Advised patient on legs elevated -Encourage compression socks.   Jerre Simon, MD [facility]

## 2022-12-18 LAB — MICROALBUMIN / CREATININE URINE RATIO
Creatinine, Urine: 59.6 mg/dL
Microalb/Creat Ratio: 19 mg/g creat (ref 0–29)
Microalbumin, Urine: 11.4 ug/mL

## 2022-12-18 LAB — LIPID PANEL
Chol/HDL Ratio: 3.3 ratio (ref 0.0–5.0)
Cholesterol, Total: 125 mg/dL (ref 100–199)
HDL: 38 mg/dL — ABNORMAL LOW (ref >39)
LDL Chol Calc (NIH): 63 mg/dL (ref 0–99)
Triglycerides: 135 mg/dL (ref 0–149)
VLDL Cholesterol Cal: 24 mg/dL (ref 5–40)

## 2023-01-01 ENCOUNTER — Other Ambulatory Visit: Payer: Self-pay | Admitting: Student

## 2023-01-01 DIAGNOSIS — Z8546 Personal history of malignant neoplasm of prostate: Secondary | ICD-10-CM

## 2023-01-11 ENCOUNTER — Other Ambulatory Visit: Payer: Self-pay | Admitting: Physician Assistant

## 2023-01-11 DIAGNOSIS — M0579 Rheumatoid arthritis with rheumatoid factor of multiple sites without organ or systems involvement: Secondary | ICD-10-CM

## 2023-01-11 NOTE — Telephone Encounter (Signed)
Last Fill: 10/22/2022  Labs: 08/19/2022 Hemoglobin is low and stable.  Patient should take multivitamin with iron.  CMP is stable. Patient called, patient will have labs drawn 01/12/2023.  Next Visit: 03/24/2023  Last Visit: 10/22/2022  DX: Rheumatoid arthritis involving multiple sites with positive rheumatoid factor   Current Dose per office note 10/22/2022: Methotrexate 2.5 mg 4 tablets twice a week on Mondays and Tuesdays only   Okay to refill Methotrexate?

## 2023-01-12 ENCOUNTER — Other Ambulatory Visit: Payer: Self-pay | Admitting: *Deleted

## 2023-01-12 DIAGNOSIS — Z79899 Other long term (current) drug therapy: Secondary | ICD-10-CM

## 2023-01-12 LAB — CBC WITH DIFFERENTIAL/PLATELET
Absolute Monocytes: 596 cells/uL (ref 200–950)
Basophils Absolute: 28 cells/uL (ref 0–200)
Eosinophils Absolute: 78 cells/uL (ref 15–500)
Hemoglobin: 12.1 g/dL — ABNORMAL LOW (ref 13.2–17.1)
Lymphs Abs: 1271 cells/uL (ref 850–3900)
MCH: 28.9 pg (ref 27.0–33.0)
MCV: 90.7 fL (ref 80.0–100.0)
MPV: 11 fL (ref 7.5–12.5)
Neutro Abs: 5126 cells/uL (ref 1500–7800)
Neutrophils Relative %: 72.2 %
Platelets: 200 10*3/uL (ref 140–400)
RBC: 4.19 10*6/uL — ABNORMAL LOW (ref 4.20–5.80)
RDW: 14.3 % (ref 11.0–15.0)

## 2023-01-13 LAB — CBC WITH DIFFERENTIAL/PLATELET
Basophils Relative: 0.4 %
Eosinophils Relative: 1.1 %
HCT: 38 % — ABNORMAL LOW (ref 38.5–50.0)
MCHC: 31.8 g/dL — ABNORMAL LOW (ref 32.0–36.0)
Monocytes Relative: 8.4 %
Total Lymphocyte: 17.9 %
WBC: 7.1 10*3/uL (ref 3.8–10.8)

## 2023-01-13 LAB — COMPLETE METABOLIC PANEL WITH GFR
AG Ratio: 1.7 (calc) (ref 1.0–2.5)
ALT: 11 U/L (ref 9–46)
AST: 12 U/L (ref 10–35)
Albumin: 3.8 g/dL (ref 3.6–5.1)
Alkaline phosphatase (APISO): 57 U/L (ref 35–144)
BUN: 13 mg/dL (ref 7–25)
CO2: 29 mmol/L (ref 20–32)
Calcium: 9.2 mg/dL (ref 8.6–10.3)
Chloride: 104 mmol/L (ref 98–110)
Creat: 1.03 mg/dL (ref 0.70–1.28)
Globulin: 2.3 g/dL (calc) (ref 1.9–3.7)
Glucose, Bld: 182 mg/dL — ABNORMAL HIGH (ref 65–99)
Potassium: 4.1 mmol/L (ref 3.5–5.3)
Sodium: 141 mmol/L (ref 135–146)
Total Bilirubin: 0.5 mg/dL (ref 0.2–1.2)
Total Protein: 6.1 g/dL (ref 6.1–8.1)
eGFR: 77 mL/min/{1.73_m2} (ref >=60)

## 2023-01-13 NOTE — Progress Notes (Signed)
Glucose is elevated.  Please notify patient and forward results to his PCP.  Hemoglobin is low and stable.

## 2023-02-01 ENCOUNTER — Other Ambulatory Visit: Payer: Self-pay | Admitting: Physician Assistant

## 2023-02-01 DIAGNOSIS — M0579 Rheumatoid arthritis with rheumatoid factor of multiple sites without organ or systems involvement: Secondary | ICD-10-CM

## 2023-02-01 NOTE — Telephone Encounter (Signed)
Last Fill: 01/11/2023 (30 day supply)  Labs: 01/12/2023 Glucose is elevated. Hemoglobin is low and stable.   Next Visit: 03/24/2023  Last Visit: 10/22/2022  DX: Rheumatoid arthritis involving multiple sites with positive rheumatoid factor   Current Dose per office note 10/22/2022: Methotrexate 2.5 mg 4 tablets twice a week on Mondays and Tuesdays only   Okay to refill Methotrexate?

## 2023-02-09 ENCOUNTER — Other Ambulatory Visit: Payer: Self-pay

## 2023-02-09 DIAGNOSIS — I10 Essential (primary) hypertension: Secondary | ICD-10-CM

## 2023-02-09 MED ORDER — VALSARTAN-HYDROCHLOROTHIAZIDE 80-12.5 MG PO TABS
0.5000 | ORAL_TABLET | Freq: Every day | ORAL | 1 refills | Status: AC
Start: 2023-02-09 — End: ?
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
## 2023-03-19 ENCOUNTER — Encounter: Payer: Self-pay | Admitting: Student

## 2023-03-19 ENCOUNTER — Other Ambulatory Visit: Payer: Self-pay

## 2023-03-19 ENCOUNTER — Ambulatory Visit (INDEPENDENT_AMBULATORY_CARE_PROVIDER_SITE_OTHER): Payer: Medicare Other | Admitting: Student

## 2023-03-19 VITALS — BP 137/61 | HR 69 | Ht 75.0 in | Wt 229.4 lb

## 2023-03-19 DIAGNOSIS — Z23 Encounter for immunization: Secondary | ICD-10-CM | POA: Diagnosis not present

## 2023-03-19 DIAGNOSIS — E119 Type 2 diabetes mellitus without complications: Secondary | ICD-10-CM

## 2023-03-19 LAB — POCT GLYCOSYLATED HEMOGLOBIN (HGB A1C): HbA1c, POC (controlled diabetic range): 6.7 % (ref 0.0–7.0)

## 2023-03-19 MED ORDER — DAPAGLIFLOZIN PROPANEDIOL 10 MG PO TABS: 10.0000 mg | ORAL_TABLET | Freq: Every day | ORAL | 3 refills | Status: DC

## 2023-03-19 NOTE — Assessment & Plan Note (Signed)
Well-controlled diabetes with A1c of 6.7.  Endorses good tolerance and compliance to current medication. -Obtained A1c -Encourage patient to complete his annual eye exam -Refilled flex sig -Encourage patient to continue medications -Follow-up in 3 months to recheck A1c and for diabetes follow-up.

## 2023-03-19 NOTE — Progress Notes (Cosign Needed Addendum)
    SUBJECTIVE:   CHIEF COMPLAINT / HPI:   Patient is a 72 year old male with past medical history significant for type 2 diabetes presenting for diabetes follow-up.  Last A1c from 3 months ago was 6.4.  Patient currently on metformin 1000 mg twice daily and Farxiga 10 mg daily.  He endorses good tolerance with the medication.  No hypoglycemia or hypoglycemic symptoms.  Does not check home blood glucose.  Denies any signs of diabetic neuropathy.  Currently on Lipitor 40 mg daily.   PERTINENT  PMH / PSH: Reviewed  OBJECTIVE:   BP 137/61   Pulse 69   Ht 6\' 3"  (1.905 m)   Wt 229 lb 6.4 oz (104.1 kg)   SpO2 100%   BMI 28.67 kg/m    Physical Exam General: Alert, well appearing, NAD Cardiovascular: RRR, No Murmurs, Normal S2/S2 Respiratory: CTAB, No wheezing or Rales Abdomen: No distension or tenderness Extremities: No edema on extremities    ASSESSMENT/PLAN:   T2DM (type 2 diabetes mellitus) (HCC) Well-controlled diabetes with A1c of 6.7.  Endorses good tolerance and compliance to current medication. -Obtained A1c -Encourage patient to complete his annual eye exam -Refilled flex sig -Encourage patient to continue medications -Follow-up in 3 months to recheck A1c and for diabetes follow-up.    Healthcare maintenance -Patient received his COVID-vaccine today.  Risk and benefit discussed -Recommend RSV vaccine and provided patient with additional information about RSV vaccine. -Patient declined flu vaccine today with plans to get them at work.  Anemia Patient Hgb from 2 months ago showed mild anemia with Hgb of 12.1. Reach out to patient after visit about lab findings. Patient to call clinic to schedule appointment to address anemia. Will need iron study at follow up appointment.   Jerre Simon, MD [facility]

## 2023-03-19 NOTE — Patient Instructions (Signed)
It was wonderful to see you today. Thank you for allowing me to be a part of your care. Below is a short summary of what we discussed at your visit today:  Your A1c today slightly elevated from 3 months ago at 6.7.  However you are still within goal.  Please continue to monitor your diet and and try to exercise when you can.  Today we are giving you your vaccine for COVID.  I have refilled your medication for Farxiga.  I have recommended getting the RSV VACCINE    Please bring all of your medications to every appointment!  If you have any questions or concerns, please do not hesitate to contact us via phone or MyChart message.   Jerre Simon, MD [facility]   Respiratory Syncytial Virus (RSV) Vaccine Injection  What is this medication? RESPIRATORY SYNCYTIAL VIRUS VACCINE (reh SPIR uh tor ee sin SISH uhl VY rus vak SEEN) reduces the risk of respiratory syncytial virus (RSV). It does not treat RSV. It is still possible to get RSV after receiving this vaccine, but the symptoms may be less severe or not last as long. It works by helping your immune system learn how to fight off a future infection. This medicine may be used for other purposes; ask your health care provider or pharmacist if you have questions. COMMON BRAND NAME(S): ABRYSVO, AREXVY What should I tell my care team before I take this medication? They need to know if you have any of these conditions: Immune system problems An unusual or allergic reaction to respiratory syncytial virus vaccine, other medications, foods, dyes, or preservatives Pregnant or trying to get pregnant Breastfeeding How should I use this medication?  This vaccine is injected into a muscle. It is given by your care team. A copy of Vaccine Information Statements will be given before each vaccination. Be sure to read this information carefully each time. This sheet may change often. A copy of Vaccine Information Statements will be  given before each vaccination. Be sure to read this information carefully each time. This sheet may change often. Talk to your care team about the use of this vaccine in children. It is not approved for use in children. Overdosage: If you think you have taken too much of this medicine contact a poison control center or emergency room at once. NOTE: This medicine is only for you. Do not share this medicine with others. What if I miss a dose? This does not apply. What may interact with this medication? Medications that lower your chance of fighting infection This list may not describe all possible interactions. Give your health care provider a list of all the medicines, herbs, non-prescription drugs, or dietary supplements you use. Also tell them if you smoke, drink alcohol, or use illegal drugs. Some items may interact with your medicine.  What should I watch for while using this medication? Visit your care team for regular health checks. Before you receive this vaccine, talk to your care team if you have an acute illness. Vaccines can be given to people with mild acute illness, such as the common cold or diarrhea. Discuss with your care team the risks and benefits of receiving this vaccine during a moderate to severe illness. Your care team may choose to wait to give you the vaccine when you feel better. Report any side effects to your care team or to the Vaccine Adverse Event Reporting System (VAERS) website at https://vaers.LAgents.no. This is only for reporting side  effects; VAERs staff do not give medical advice. What side effects may I notice from receiving this medication? Side effects that you should report to your care team as soon as possible: Allergic reactions--skin rash, itching, hives, swelling of the face, lips, tongue, or throat Side effects that usually do not require medical attention (report these to your care team if they continue or are bothersome): Fatigue Feeling faint or  lightheaded Headache Joint pain Muscle pain Pain, redness, or irritation at injection site  This list may not describe all possible side effects. Call your doctor for medical advice about side effects. You may report side effects to FDA at 1-800-FDA-1088. Where should I keep my medication? This vaccine is only given by your care team. It will not be stored at home. NOTE: This sheet is a summary. It may not cover all possible information. If you have questions about this medicine, talk to your doctor, pharmacist, or health care provider.  2024 Elsevier/Gold Standard (2022-06-25 00:00:00)

## 2023-03-21 ENCOUNTER — Encounter: Payer: Self-pay | Admitting: Student

## 2023-03-24 ENCOUNTER — Ambulatory Visit: Payer: Medicare Other | Attending: Physician Assistant | Admitting: Physician Assistant

## 2023-03-24 ENCOUNTER — Encounter: Payer: Self-pay | Admitting: Physician Assistant

## 2023-03-24 VITALS — BP 116/66 | HR 65 | Resp 16 | Ht 75.0 in | Wt 227.0 lb

## 2023-03-24 DIAGNOSIS — Z87448 Personal history of other diseases of urinary system: Secondary | ICD-10-CM

## 2023-03-24 DIAGNOSIS — M19071 Primary osteoarthritis, right ankle and foot: Secondary | ICD-10-CM

## 2023-03-24 DIAGNOSIS — Z8546 Personal history of malignant neoplasm of prostate: Secondary | ICD-10-CM

## 2023-03-24 DIAGNOSIS — Z8639 Personal history of other endocrine, nutritional and metabolic disease: Secondary | ICD-10-CM

## 2023-03-24 DIAGNOSIS — M19042 Primary osteoarthritis, left hand: Secondary | ICD-10-CM

## 2023-03-24 DIAGNOSIS — M0579 Rheumatoid arthritis with rheumatoid factor of multiple sites without organ or systems involvement: Secondary | ICD-10-CM

## 2023-03-24 DIAGNOSIS — M25561 Pain in right knee: Secondary | ICD-10-CM

## 2023-03-24 DIAGNOSIS — M19041 Primary osteoarthritis, right hand: Secondary | ICD-10-CM | POA: Diagnosis not present

## 2023-03-24 DIAGNOSIS — Z79899 Other long term (current) drug therapy: Secondary | ICD-10-CM | POA: Diagnosis not present

## 2023-03-24 DIAGNOSIS — R519 Headache, unspecified: Secondary | ICD-10-CM

## 2023-03-24 DIAGNOSIS — M19072 Primary osteoarthritis, left ankle and foot: Secondary | ICD-10-CM

## 2023-03-24 DIAGNOSIS — G8929 Other chronic pain: Secondary | ICD-10-CM

## 2023-03-24 DIAGNOSIS — M5030 Other cervical disc degeneration, unspecified cervical region: Secondary | ICD-10-CM

## 2023-03-24 DIAGNOSIS — Z8679 Personal history of other diseases of the circulatory system: Secondary | ICD-10-CM

## 2023-03-24 DIAGNOSIS — M5136 Other intervertebral disc degeneration, lumbar region: Secondary | ICD-10-CM

## 2023-03-24 DIAGNOSIS — G252 Other specified forms of tremor: Secondary | ICD-10-CM

## 2023-03-24 NOTE — Patient Instructions (Signed)
Standing Labs We placed an order today for your standing lab work.   Please have your standing labs drawn in October and every 3 months   Please have your labs drawn 2 weeks prior to your appointment so that the provider can discuss your lab results at your appointment, if possible.  Please note that you may see your imaging and lab results in MyChart before we have reviewed them. We will contact you once all results are reviewed. Please allow our office up to 72 hours to thoroughly review all of the results before contacting the office for clarification of your results.  WALK-IN LAB HOURS  Monday through Thursday from 8:00 am -12:30 pm and 1:00 pm-5:00 pm and Friday from 8:00 am-12:00 pm.  Patients with office visits requiring labs will be seen before walk-in labs.  You may encounter longer than normal wait times. Please allow additional time. Wait times may be shorter on  Monday and Thursday afternoons.  We do not book appointments for walk-in labs. We appreciate your patience and understanding with our staff.   Labs are drawn by [facility]. Please bring your co-pay at the time of your lab draw.  You may receive a bill from [facility] for your lab work.  Please note if you are on Hydroxychloroquine and and an order has been placed for a Hydroxychloroquine level,  you will need to have it drawn 4 hours or more after your last dose.  If you wish to have your labs drawn at another location, please call the office 24 hours in advance so we can fax the orders.  The office is located at [address], [place], [place] [address]   If you have any questions regarding directions or hours of operation,  please call [phone].   As a reminder, please drink plenty of water prior to coming for your lab work. Thanks!

## 2023-03-30 ENCOUNTER — Other Ambulatory Visit: Payer: Self-pay | Admitting: Student

## 2023-03-30 DIAGNOSIS — E118 Type 2 diabetes mellitus with unspecified complications: Secondary | ICD-10-CM

## 2023-03-31 ENCOUNTER — Other Ambulatory Visit: Payer: Self-pay | Admitting: Student

## 2023-03-31 DIAGNOSIS — Z8546 Personal history of malignant neoplasm of prostate: Secondary | ICD-10-CM

## 2023-04-02 ENCOUNTER — Other Ambulatory Visit: Payer: Self-pay | Admitting: Student

## 2023-04-02 DIAGNOSIS — I10 Essential (primary) hypertension: Secondary | ICD-10-CM

## 2023-04-16 LAB — HM DIABETES EYE EXAM

## 2023-04-21 ENCOUNTER — Other Ambulatory Visit: Payer: Self-pay | Admitting: Student

## 2023-04-21 DIAGNOSIS — E785 Hyperlipidemia, unspecified: Secondary | ICD-10-CM

## 2023-04-26 ENCOUNTER — Telehealth: Payer: Self-pay | Admitting: Rheumatology

## 2023-04-26 NOTE — Telephone Encounter (Signed)
Pt called asking if he needs to come in to have labs drawn. Pt can not remember if he had them in October yet.

## 2023-04-26 NOTE — Telephone Encounter (Signed)
Patient advised he is due for labs. Patient advised we do not have a lab tech in the office today. Patient advised to call the office prior to coming to the office later this week. Patient expressed understanding.

## 2023-04-27 ENCOUNTER — Other Ambulatory Visit: Payer: Self-pay | Admitting: *Deleted

## 2023-04-27 DIAGNOSIS — Z79899 Other long term (current) drug therapy: Secondary | ICD-10-CM

## 2023-04-28 ENCOUNTER — Other Ambulatory Visit: Payer: Self-pay | Admitting: Student

## 2023-04-28 DIAGNOSIS — I10 Essential (primary) hypertension: Secondary | ICD-10-CM

## 2023-04-28 LAB — CBC WITH DIFFERENTIAL/PLATELET
Absolute Lymphocytes: 1076 {cells}/uL (ref 850–3900)
Absolute Monocytes: 649 {cells}/uL (ref 200–950)
Basophils Absolute: 21 {cells}/uL (ref 0–200)
Basophils Relative: 0.3 %
Eosinophils Absolute: 62 {cells}/uL (ref 15–500)
Eosinophils Relative: 0.9 %
HCT: 40.9 % (ref 38.5–50.0)
Hemoglobin: 12.8 g/dL — ABNORMAL LOW (ref 13.2–17.1)
MCH: 28.9 pg (ref 27.0–33.0)
MCHC: 31.3 g/dL — ABNORMAL LOW (ref 32.0–36.0)
MCV: 92.3 fL (ref 80.0–100.0)
MPV: 11.1 fL (ref 7.5–12.5)
Monocytes Relative: 9.4 %
Neutro Abs: 5092 {cells}/uL (ref 1500–7800)
Neutrophils Relative %: 73.8 %
Platelets: 214 10*3/uL (ref 140–400)
RBC: 4.43 10*6/uL (ref 4.20–5.80)
RDW: 13.8 % (ref 11.0–15.0)
Total Lymphocyte: 15.6 %
WBC: 6.9 10*3/uL (ref 3.8–10.8)

## 2023-04-28 LAB — COMPLETE METABOLIC PANEL WITH GFR
AG Ratio: 1.4 (calc) (ref 1.0–2.5)
ALT: 9 U/L (ref 9–46)
AST: 9 U/L — ABNORMAL LOW (ref 10–35)
Albumin: 3.8 g/dL (ref 3.6–5.1)
Alkaline phosphatase (APISO): 62 U/L (ref 35–144)
BUN: 14 mg/dL (ref 7–25)
CO2: 28 mmol/L (ref 20–32)
Calcium: 9.8 mg/dL (ref 8.6–10.3)
Chloride: 105 mmol/L (ref 98–110)
Creat: 1.09 mg/dL (ref 0.70–1.28)
Globulin: 2.8 g/dL (ref 1.9–3.7)
Glucose, Bld: 158 mg/dL — ABNORMAL HIGH (ref 65–99)
Potassium: 4.1 mmol/L (ref 3.5–5.3)
Sodium: 143 mmol/L (ref 135–146)
Total Bilirubin: 0.4 mg/dL (ref 0.2–1.2)
Total Protein: 6.6 g/dL (ref 6.1–8.1)
eGFR: 72 mL/min/{1.73_m2} (ref >=60)

## 2023-04-28 NOTE — Progress Notes (Signed)
Hemoglobin remains low but has improved.  Rest of CBC stable.  Glucose is 158.  Rest of CMP WNL

## 2023-05-03 ENCOUNTER — Other Ambulatory Visit: Payer: Self-pay | Admitting: Rheumatology

## 2023-05-03 DIAGNOSIS — M0579 Rheumatoid arthritis with rheumatoid factor of multiple sites without organ or systems involvement: Secondary | ICD-10-CM

## 2023-05-03 MED ORDER — METHOTREXATE SODIUM 2.5 MG PO TABS: ORAL_TABLET | ORAL | 0 refills | Status: DC

## 2023-05-03 NOTE — Telephone Encounter (Signed)
Patient contacted the office to request a medication refill.   1. Name of Medication: Methotrexate  2. How are you currently taking this medication (dosage and times per day)? TAKE 4 TABLETS BY MOUTH TWICE A WEEK ON MONDAYS AND TUESDAYS ONLY    3. What pharmacy would you like for that to be sent to? [facility] [address]

## 2023-05-03 NOTE — Telephone Encounter (Signed)
Last Fill: 02/01/2023  Labs: 04/27/2023 Hemoglobin remains low but has improved.  Rest of CBC stable. Glucose is 158.  Rest of CMP WNL  Next Visit: 08/23/2022  Last Visit: 03/24/2023  DX: Rheumatoid arthritis involving multiple sites with positive rheumatoid factor   Current Dose per office note 03/24/2023: Methotrexate 2.5 mg 4 tablets twice a week on Mondays and Tuesdays only   Okay to refill Methotrexate?

## 2023-05-04 ENCOUNTER — Other Ambulatory Visit: Payer: Self-pay | Admitting: Physician Assistant

## 2023-05-04 DIAGNOSIS — M0579 Rheumatoid arthritis with rheumatoid factor of multiple sites without organ or systems involvement: Secondary | ICD-10-CM

## 2023-05-04 MED ORDER — METHOTREXATE SODIUM 2.5 MG PO TABS: ORAL_TABLET | ORAL | 0 refills | Status: DC

## 2023-05-04 NOTE — Telephone Encounter (Signed)
Patient called and states his rx was supposed to be sent to [facility] on [address]. Patient is there now. Prescription has been sent. I have called [facility] to cancel methotrexate prescription.

## 2023-05-04 NOTE — Addendum Note (Signed)
Addended by: Ellen Henri on: 05/04/2023 09:58 AM   Modules accepted: Orders

## 2023-05-24 ENCOUNTER — Other Ambulatory Visit: Payer: Self-pay | Admitting: Student

## 2023-05-24 DIAGNOSIS — I10 Essential (primary) hypertension: Secondary | ICD-10-CM

## 2023-06-03 ENCOUNTER — Other Ambulatory Visit: Payer: Self-pay | Admitting: Student

## 2023-06-03 DIAGNOSIS — I10 Essential (primary) hypertension: Secondary | ICD-10-CM

## 2023-06-07 ENCOUNTER — Other Ambulatory Visit: Payer: Self-pay | Admitting: Student

## 2023-06-07 DIAGNOSIS — I10 Essential (primary) hypertension: Secondary | ICD-10-CM

## 2023-06-07 MED ORDER — VALSARTAN-HYDROCHLOROTHIAZIDE 80-12.5 MG PO TABS
1.0000 | ORAL_TABLET | Freq: Every day | ORAL | 11 refills | Status: DC
Start: 2023-06-07 — End: 2023-06-09

## 2023-06-07 NOTE — Progress Notes (Signed)
Refill - Valsartan-hydrochlorothiazide

## 2023-06-09 ENCOUNTER — Other Ambulatory Visit: Payer: Self-pay | Admitting: Student

## 2023-06-09 DIAGNOSIS — I10 Essential (primary) hypertension: Secondary | ICD-10-CM

## 2023-06-09 NOTE — Telephone Encounter (Signed)
Patient came in requesting a refill on his Valsartan, he states he last took it 12/2 and is completely out.

## 2023-06-10 ENCOUNTER — Other Ambulatory Visit: Payer: Self-pay | Admitting: Student

## 2023-06-10 DIAGNOSIS — I10 Essential (primary) hypertension: Secondary | ICD-10-CM

## 2023-06-10 MED ORDER — VALSARTAN-HYDROCHLOROTHIAZIDE 80-12.5 MG PO TABS: 1.0000 | ORAL_TABLET | Freq: Every day | ORAL | 11 refills | Status: DC

## 2023-06-17 ENCOUNTER — Other Ambulatory Visit: Payer: Self-pay | Admitting: Student

## 2023-06-17 ENCOUNTER — Telehealth: Payer: Self-pay | Admitting: Student

## 2023-06-17 DIAGNOSIS — E1159 Type 2 diabetes mellitus with other circulatory complications: Secondary | ICD-10-CM

## 2023-06-17 MED ORDER — VALSARTAN 80 MG PO TABS
80.0000 mg | ORAL_TABLET | Freq: Every day | ORAL | 3 refills | Status: DC
Start: 2023-06-17 — End: 2023-06-28

## 2023-06-17 NOTE — Progress Notes (Signed)
Refilled patient's valsartan as requested from patient.  Patient say he is no longer taking valsartan-hydrochlorothiazide combo pill.

## 2023-06-17 NOTE — Telephone Encounter (Signed)
Patient came in stating that he need a refill on his Valsartan. States the prescription that was just sent in is not the right medication. He was taking Valsartan 80MG  Tab, but the medication he just got is Valsartan- Hydrochlorothiazide. Please call patient if there are any questions

## 2023-06-22 NOTE — Patient Instructions (Signed)
Below is our plan:  We will take carbidopa-levodopa 1 tablet four times daily (10:30a, 2:30p, 6:30p, 10:30pm)  Please make sure you are staying well hydrated. I recommend 50-60 ounces daily. Well balanced diet and regular exercise encouraged. Consistent sleep schedule with 6-8 hours recommended.   Please continue follow up with care team as directed.   Follow up with me in 6 months   You may receive a survey regarding today's visit. I encourage you to leave honest feed back as I do use this information to improve patient care. Thank you for seeing me today!
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
## 2023-06-23 ENCOUNTER — Encounter: Payer: Self-pay | Admitting: Family Medicine

## 2023-06-23 ENCOUNTER — Ambulatory Visit: Payer: Medicare Other | Admitting: Family Medicine

## 2023-06-23 VITALS — BP 147/81 | HR 71 | Ht 75.0 in | Wt 230.5 lb

## 2023-06-23 DIAGNOSIS — G20A2 Parkinson's disease without dyskinesia, with fluctuations: Secondary | ICD-10-CM

## 2023-06-24 ENCOUNTER — Other Ambulatory Visit: Payer: Self-pay | Admitting: *Deleted

## 2023-06-24 ENCOUNTER — Ambulatory Visit: Payer: Medicare Other | Admitting: Student

## 2023-06-24 ENCOUNTER — Other Ambulatory Visit: Payer: Self-pay | Admitting: Rheumatology

## 2023-06-24 MED ORDER — NYSTATIN 100000 UNIT/ML MT SUSP: 5.0000 mL | Freq: Three times a day (TID) | OROMUCOSAL | 0 refills | Status: DC

## 2023-06-24 MED ORDER — CARBIDOPA-LEVODOPA 25-100 MG PO TABS: 1.0000 | ORAL_TABLET | Freq: Four times a day (QID) | ORAL | 3 refills | Status: DC

## 2023-06-24 NOTE — Telephone Encounter (Signed)
Last Fill: 07/13/2022  Next Visit: 08/24/2023  Last Visit: 03/24/2023  Dx: Rheumatoid arthritis involving multiple sites with positive rheumatoid factor   Current Dose per office note on 03/24/2023: not discussed  Okay to refill Magic Mouthwash?

## 2023-06-24 NOTE — Telephone Encounter (Signed)
Patient contacted the office to request a medication refill.   1. Name of Medication: Magic mouthwash  2. How are you currently taking this medication (dosage and times per day)? 4 times daily   3. What pharmacy would you like for that to be sent to? [facility] [address] [facility] [place]

## 2023-06-25 MED ORDER — NYSTATIN 100000 UNIT/ML MT SUSP: 5.0000 mL | Freq: Three times a day (TID) | OROMUCOSAL | 0 refills | Status: DC

## 2023-06-28 ENCOUNTER — Encounter: Payer: Self-pay | Admitting: Student

## 2023-06-28 ENCOUNTER — Ambulatory Visit (INDEPENDENT_AMBULATORY_CARE_PROVIDER_SITE_OTHER): Payer: Medicare Other | Admitting: Student

## 2023-06-28 ENCOUNTER — Other Ambulatory Visit: Payer: Self-pay | Admitting: Student

## 2023-06-28 VITALS — BP 130/52 | HR 71 | Ht 75.0 in | Wt 232.2 lb

## 2023-06-28 DIAGNOSIS — E119 Type 2 diabetes mellitus without complications: Secondary | ICD-10-CM

## 2023-06-28 DIAGNOSIS — E1159 Type 2 diabetes mellitus with other circulatory complications: Secondary | ICD-10-CM | POA: Diagnosis not present

## 2023-06-28 DIAGNOSIS — I152 Hypertension secondary to endocrine disorders: Secondary | ICD-10-CM

## 2023-06-28 DIAGNOSIS — Z7984 Long term (current) use of oral hypoglycemic drugs: Secondary | ICD-10-CM

## 2023-06-28 DIAGNOSIS — Z8546 Personal history of malignant neoplasm of prostate: Secondary | ICD-10-CM

## 2023-06-28 LAB — POCT GLYCOSYLATED HEMOGLOBIN (HGB A1C): HbA1c, POC (controlled diabetic range): 6.7 % (ref 0.0–7.0)

## 2023-06-28 MED ORDER — VALSARTAN-HYDROCHLOROTHIAZIDE 80-12.5 MG PO TABS: 1.0000 | ORAL_TABLET | Freq: Every day | ORAL | 3 refills | Status: DC

## 2023-06-28 NOTE — Assessment & Plan Note (Signed)
Well-controlled diabetes with A1c obtained today stable from 3 months ago at 6.7%.  Currently no signs of polydipsia, polyuria or peripheral neuropathy. -Patient will continue current diabetes medication regimen -Obtained A1c - Pt signed release form to obtain record of recent diabetic eye exam

## 2023-06-28 NOTE — Patient Instructions (Signed)
Nice to see you today.  Your A1c today was 6.7% which is unchanged from 3 months ago.  Continue to take your medications as prescribed.  Also your blood pressure medication you should only take the valsartan-HCTZ combination pill once daily.  Have sent in prescription for 90 days with refills.  Follow-up in 3 months.

## 2023-06-28 NOTE — Progress Notes (Addendum)
    SUBJECTIVE:   CHIEF COMPLAINT / HPI: Type 2 Diabetes   Last A1c  3 months ago 6.7 On metform 1000mg  BID & Farxiga 10mg  Doesn't check home blood glucose Currently taking Lipitor 40mg  daily Denies any polyuria, polydipsia or peripheral neuropathy Up-to-date on eye exam completed 4 months ago.  Hypertension Blood pressure readings mostly within normal range and occasionally low Denies any dizziness or recent falls. Denies any headache, vision changes or chest pain Currently on valsartan 80 mg and half tablet of valsartan-HCTZ 80-12.5 Per patient valsartan 80 mg was started at an urgent care visit few months ago.   PERTINENT  PMH / PSH: Reviewed   OBJECTIVE:   BP (!) 130/52   Pulse 71   Ht 6\' 3"  (1.905 m)   Wt 232 lb 3.2 oz (105.3 kg)   SpO2 100%   BMI 29.02 kg/m   Physical Exam General: Alert, well appearing, NAD Cardiovascular: RRR, No Murmurs, Normal S2/S2 Respiratory: CTAB, No wheezing or Rales Abdomen: No distension or tenderness Extremities: No edema on extremities     ASSESSMENT/PLAN:   Hypertension associated with diabetes (HCC) BP mildly hypotensive today and patient currently asymptomatic.  Discussion with patient he was currently taking valsartan 80 mg and half of valsartan-HCTZ combo pill.  Suspect his hypotension was most likely due to his current regimen.  Discussed with patient there is no need for him to be on multiple valsartan medication.  Will discontinue the valsartan 80 mg while taking full tablet of the valsartan-HCTZ combo pill.  Follow-up in a week to reassess blood pressure -Discontinue valsartan 80 mg -Take full tablet of the valsartan-HCTZ 80-12.5 mg daily. -Follow-up in 1 week to reassess BP  T2DM (type 2 diabetes mellitus) (HCC) Well-controlled diabetes with A1c obtained today stable from 3 months ago at 6.7%.  Currently no signs of polydipsia, polyuria or peripheral neuropathy. -Patient will continue current diabetes medication  regimen -Obtained A1c - Pt signed release form to obtain record of recent diabetic eye exam      Jerre Simon, MD [facility]

## 2023-06-28 NOTE — Assessment & Plan Note (Signed)
BP mildly hypotensive today and patient currently asymptomatic.  Discussion with patient he was currently taking valsartan 80 mg and half of valsartan-HCTZ combo pill.  Suspect his hypotension was most likely due to his current regimen.  Cussed with patient there is no need for him to be on multiple valsartan medication.  Will discontinue the valsartan 80 mg while taking full tablet of the valsartan-HCTZ combo pill.  Follow-up in a week to reassess blood pressure -Discontinue valsartan 80 mg -Take full tablet of the valsartan-HCTZ 80-12.5 mg daily. -Follow-up in 1 week to reassess BP

## 2023-07-20 ENCOUNTER — Other Ambulatory Visit: Payer: Self-pay | Admitting: *Deleted

## 2023-07-20 ENCOUNTER — Other Ambulatory Visit: Payer: Self-pay | Admitting: Rheumatology

## 2023-07-20 DIAGNOSIS — M0579 Rheumatoid arthritis with rheumatoid factor of multiple sites without organ or systems involvement: Secondary | ICD-10-CM

## 2023-07-20 DIAGNOSIS — Z79899 Other long term (current) drug therapy: Secondary | ICD-10-CM

## 2023-07-20 MED ORDER — METHOTREXATE SODIUM 2.5 MG PO TABS: ORAL_TABLET | ORAL | 0 refills | Status: DC

## 2023-07-20 NOTE — Telephone Encounter (Signed)
 Patient contacted the office to request a medication refill.   1. Name of Medication: Methotrexate   2. How are you currently taking this medication (dosage and times per day)? 4 tablets    3. What pharmacy would you like for that to be sent to? [facility] [address]

## 2023-07-20 NOTE — Telephone Encounter (Signed)
 Last Fill: 05/04/2023  Labs: 04/27/2023 Hemoglobin remains low but has improved.  Rest of CBC stable. Glucose is 158.  Rest of CMP WNL  Next Visit: 08/24/2023  Last Visit: 03/24/2023  DX: Rheumatoid arthritis involving multiple sites with positive rheumatoid factor   Current Dose per office note 03/24/2023: Methotrexate  2.5 mg 4 tablets twice a week on Mondays and Tuesdays only   Patient updated labs in office today.   Okay to refill Methotrexate ?

## 2023-07-21 LAB — COMPLETE METABOLIC PANEL WITH GFR
AG Ratio: 1.6 (calc) (ref 1.0–2.5)
ALT: 9 U/L (ref 9–46)
AST: 9 U/L — ABNORMAL LOW (ref 10–35)
Albumin: 4 g/dL (ref 3.6–5.1)
Alkaline phosphatase (APISO): 59 U/L (ref 35–144)
BUN: 14 mg/dL (ref 7–25)
CO2: 30 mmol/L (ref 20–32)
Calcium: 9.5 mg/dL (ref 8.6–10.3)
Chloride: 105 mmol/L (ref 98–110)
Creat: 0.97 mg/dL (ref 0.70–1.28)
Globulin: 2.5 g/dL (ref 1.9–3.7)
Glucose, Bld: 101 mg/dL — ABNORMAL HIGH (ref 65–99)
Potassium: 4.4 mmol/L (ref 3.5–5.3)
Sodium: 142 mmol/L (ref 135–146)
Total Bilirubin: 0.6 mg/dL (ref 0.2–1.2)
Total Protein: 6.5 g/dL (ref 6.1–8.1)
eGFR: 82 mL/min/{1.73_m2} (ref >=60)

## 2023-07-21 LAB — CBC WITH DIFFERENTIAL/PLATELET
Absolute Lymphocytes: 1214 {cells}/uL (ref 850–3900)
Absolute Monocytes: 566 {cells}/uL (ref 200–950)
Basophils Absolute: 21 {cells}/uL (ref 0–200)
Basophils Relative: 0.3 %
Eosinophils Absolute: 62 {cells}/uL (ref 15–500)
Eosinophils Relative: 0.9 %
HCT: 39.8 % (ref 38.5–50.0)
Hemoglobin: 12.8 g/dL — ABNORMAL LOW (ref 13.2–17.1)
MCH: 29.4 pg (ref 27.0–33.0)
MCHC: 32.2 g/dL (ref 32.0–36.0)
MCV: 91.3 fL (ref 80.0–100.0)
MPV: 11.3 fL (ref 7.5–12.5)
Monocytes Relative: 8.2 %
Neutro Abs: 5037 {cells}/uL (ref 1500–7800)
Neutrophils Relative %: 73 %
Platelets: 216 10*3/uL (ref 140–400)
RBC: 4.36 10*6/uL (ref 4.20–5.80)
RDW: 13.8 % (ref 11.0–15.0)
Total Lymphocyte: 17.6 %
WBC: 6.9 10*3/uL (ref 3.8–10.8)

## 2023-07-21 NOTE — Progress Notes (Signed)
 CBC and CMP are normal except hemoglobin is low.  Patient should take multivitamin with iron.
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
## 2023-08-23 IMAGING — CT CT L SPINE W/O CM
3 series · 13 of 33 positions shown, 16 images · non-contrast
Comparison: MRI lumbar spine 10/14/2011

CLINICAL DATA: Fall 2 weeks ago. Lumbar spine compression fracture.
Back pain and right leg pain

EXAM:
CT LUMBAR SPINE WITHOUT CONTRAST
TECHNIQUE: Multidetector CT imaging of the lumbar spine was performed without
intravenous contrast administration. Multiplanar CT image
reconstructions were also generated.

[Series 3: l-spine 2.0 st · axial · 0.40mm/px · z∈[+1120,+1282]mm · 5 of 119 slices shown, 7 images]
[im 19/119  soft-tissue]
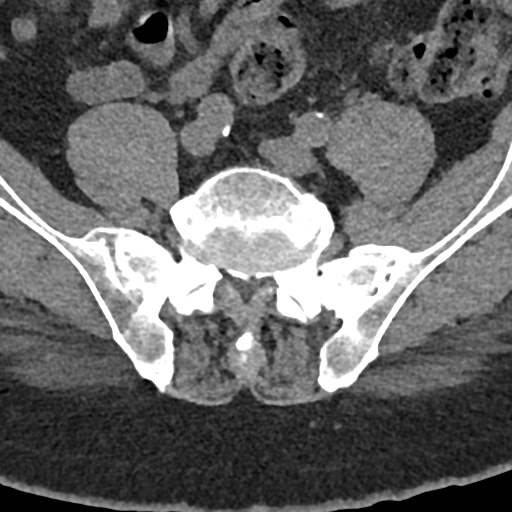
[im 19/119  bone]
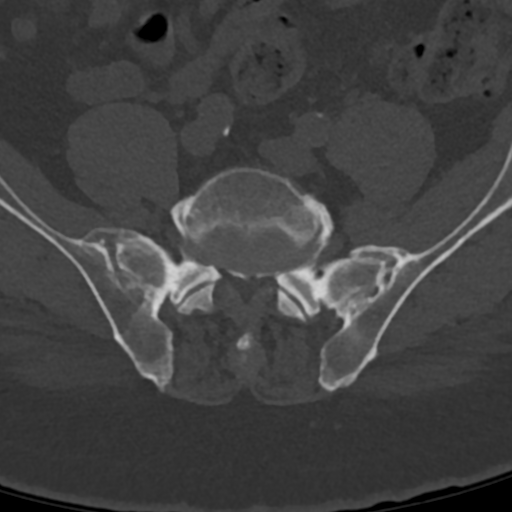
[im 37/119  bone]
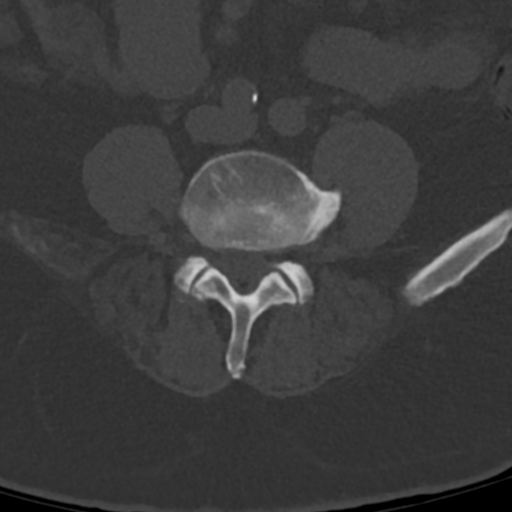
[im 64/119  bone]
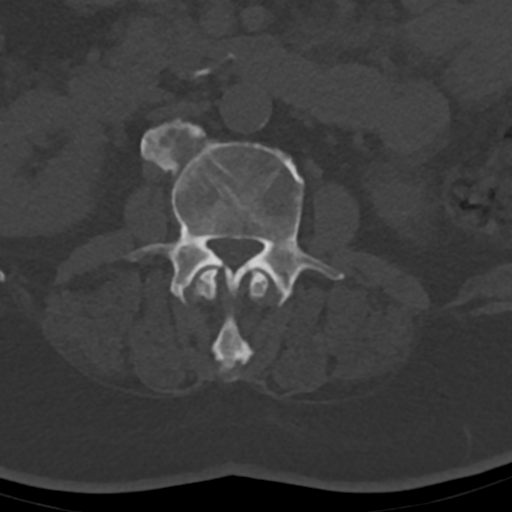
[im 82/119  bone]
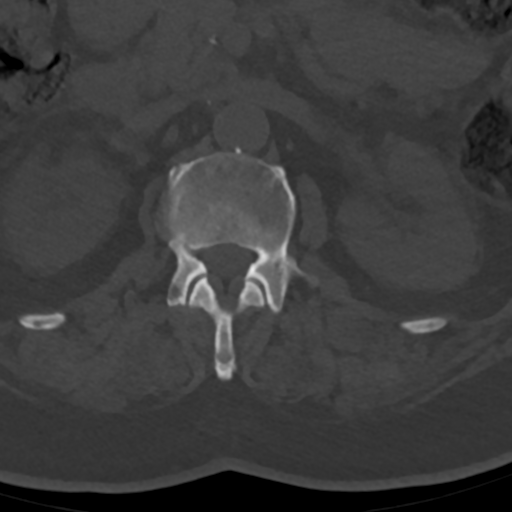
[im 100/119  soft-tissue]
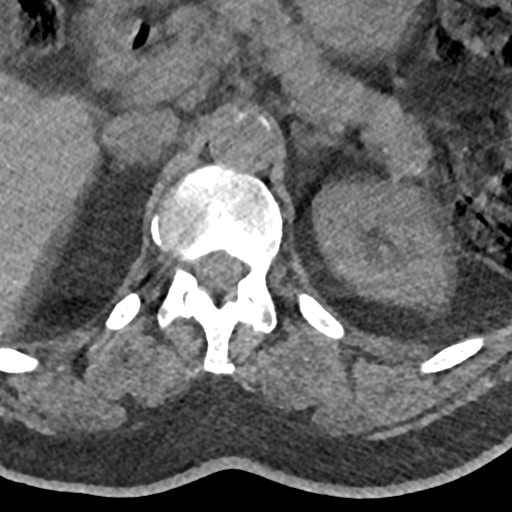
[im 100/119  bone]
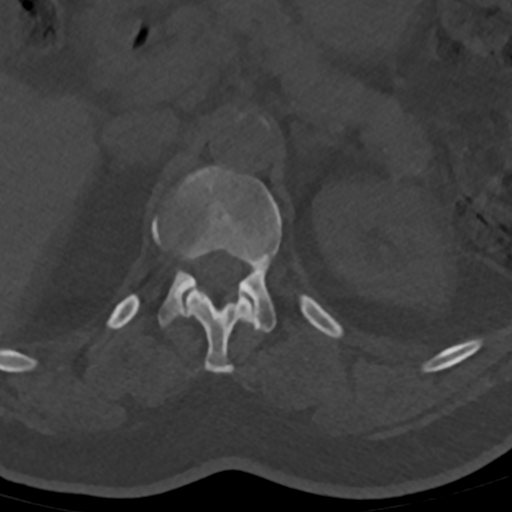

[Series 9: l-spine 2.0 cor · coronal · 0.35mm/px · 3 of 92 slices shown]
[im 19/92  bone]
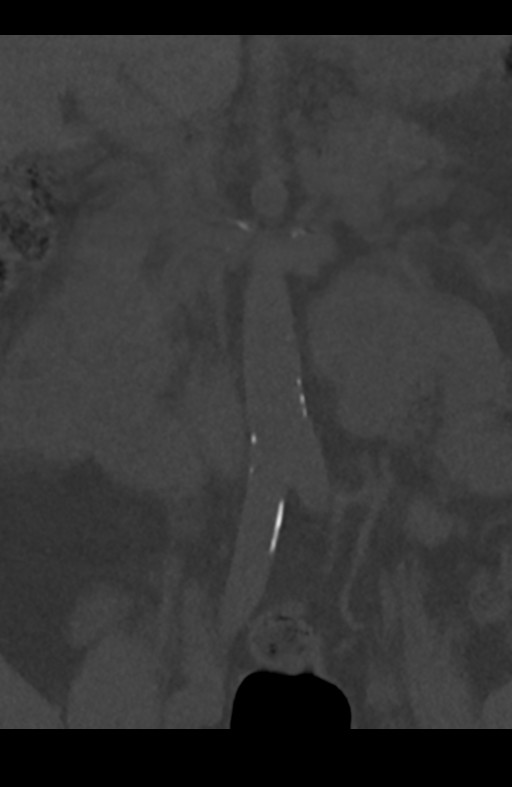
[im 37/92  bone]
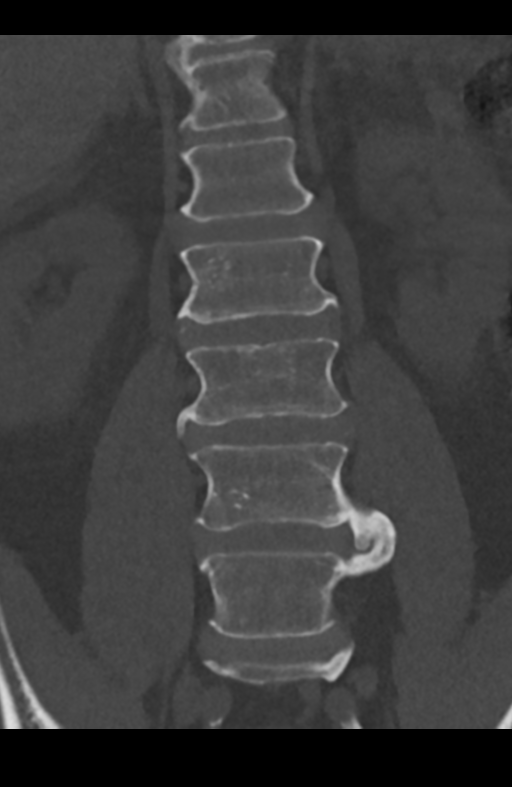
[im 55/92  bone]
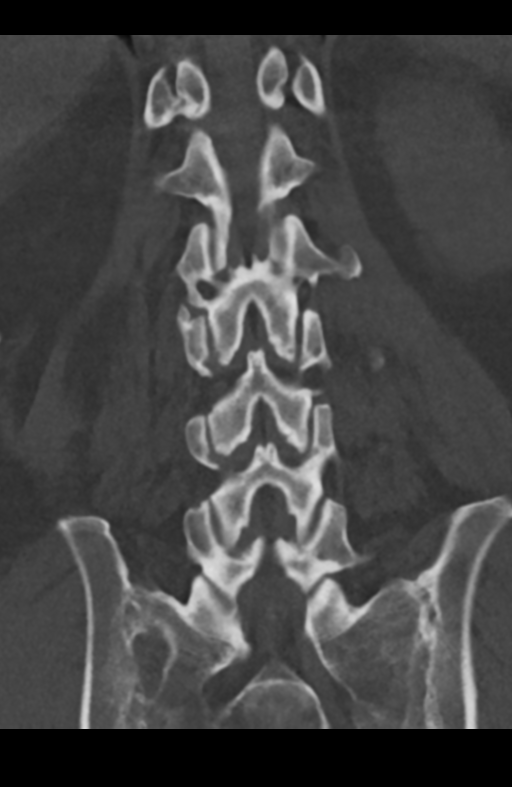

[Series 10: l-spine 2.0 sag · sagittal · 0.35mm/px · 5 of 59 slices shown, 6 images]
[im 20/59  bone]
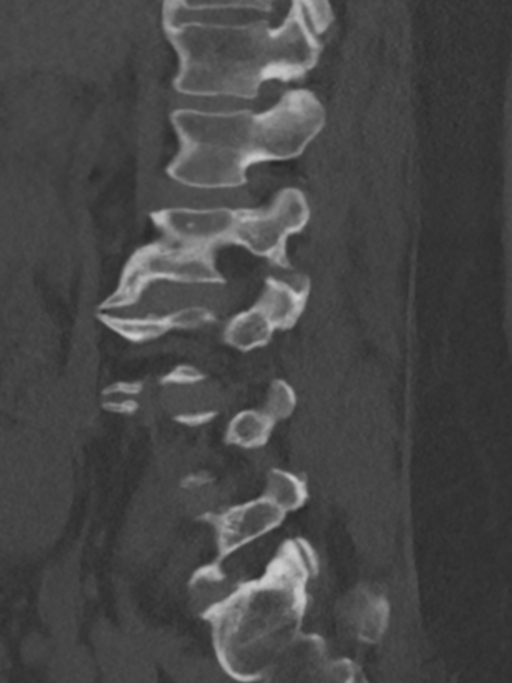
[im 25/59  bone]
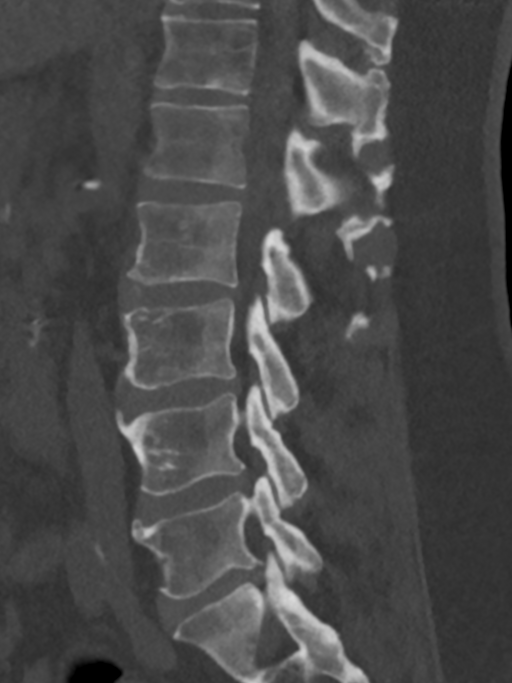
[im 30/59  soft-tissue]
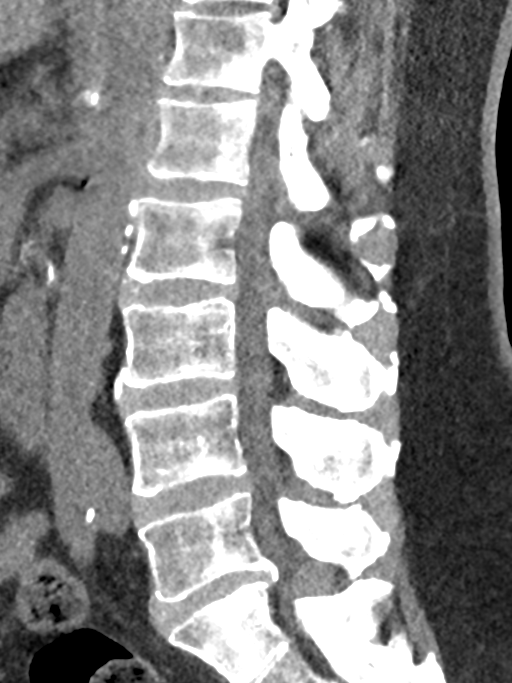
[im 30/59  bone]
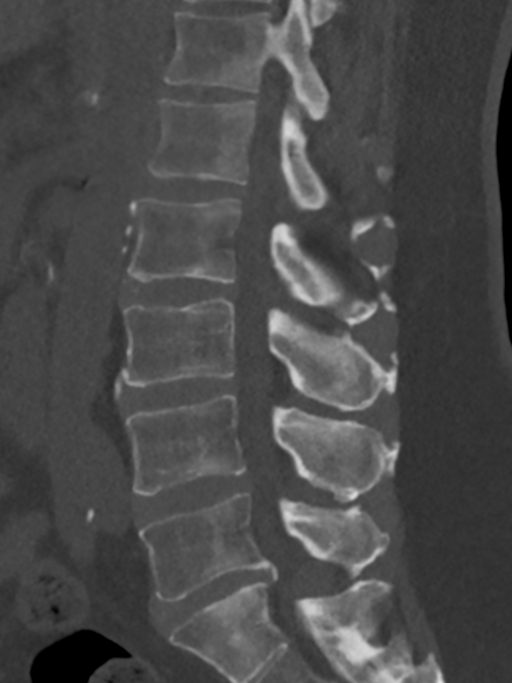
[im 34/59  bone]
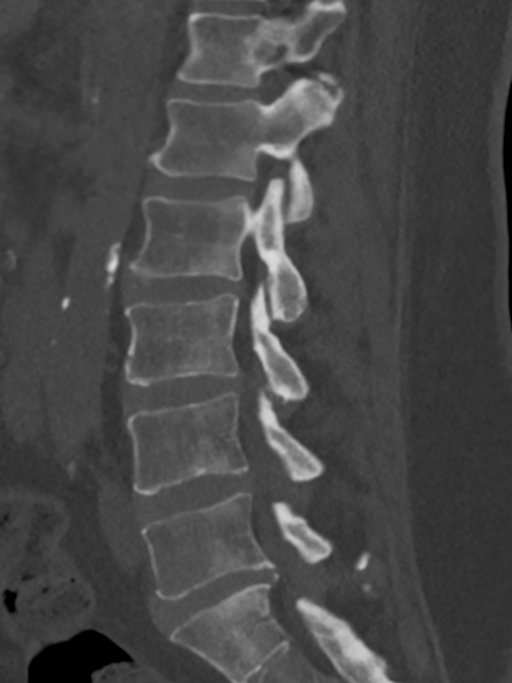
[im 39/59  bone]
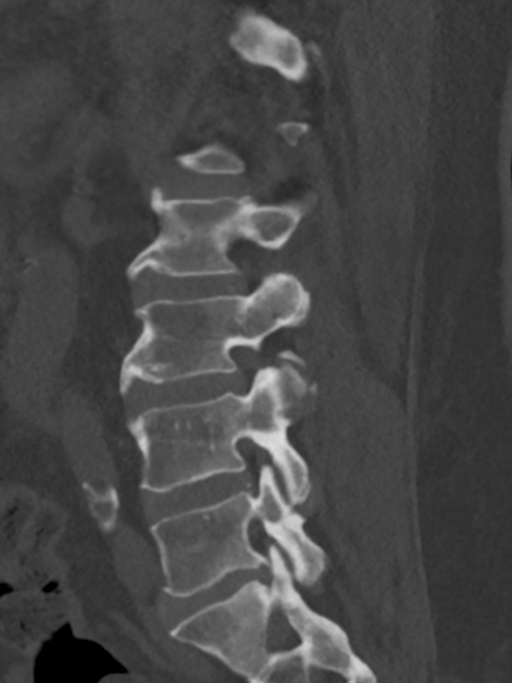

[13 of 33 positions shown; findings below may reference images not displayed]

FINDINGS: Segmentation: 5 lumbar segments.

Alignment: Normal

Vertebrae: Negative for fracture or mass.

Paraspinal and other soft tissues: Negative for paraspinous mass or
adenopathy. Mild atherosclerotic aorta.

Disc levels: L1-2: Mild disc bulging without significant stenosis

L2-3: Mild disc bulging and mild facet degeneration without
significant stenosis

L3-4: Mild to moderate disc bulging and bilateral facet
degeneration. No significant stenosis

L4-5: Diffuse disc bulging and moderate facet hypertrophy. Mild
subarticular stenosis bilaterally. Mild central canal stenosis

L5-S1: Disc degeneration with diffuse endplate spurring and disc
bulging. Moderate spinal stenosis and moderate subarticular stenosis
bilaterally.
IMPRESSION: Negative for lumbar spine fracture

Lumbar spondylosis and spinal stenosis as above.

## 2023-08-24 ENCOUNTER — Ambulatory Visit: Payer: Medicare Other | Attending: Rheumatology | Admitting: Rheumatology

## 2023-08-24 ENCOUNTER — Encounter: Payer: Self-pay | Admitting: Rheumatology

## 2023-08-24 VITALS — BP 129/76 | HR 65 | Resp 16 | Ht 75.0 in | Wt 226.0 lb

## 2023-08-24 DIAGNOSIS — G8929 Other chronic pain: Secondary | ICD-10-CM

## 2023-08-24 DIAGNOSIS — Z8669 Personal history of other diseases of the nervous system and sense organs: Secondary | ICD-10-CM

## 2023-08-24 DIAGNOSIS — Z79899 Other long term (current) drug therapy: Secondary | ICD-10-CM

## 2023-08-24 DIAGNOSIS — Z87448 Personal history of other diseases of urinary system: Secondary | ICD-10-CM

## 2023-08-24 DIAGNOSIS — R519 Headache, unspecified: Secondary | ICD-10-CM

## 2023-08-24 DIAGNOSIS — M0579 Rheumatoid arthritis with rheumatoid factor of multiple sites without organ or systems involvement: Secondary | ICD-10-CM

## 2023-08-24 DIAGNOSIS — M25561 Pain in right knee: Secondary | ICD-10-CM

## 2023-08-24 DIAGNOSIS — M19072 Primary osteoarthritis, left ankle and foot: Secondary | ICD-10-CM

## 2023-08-24 DIAGNOSIS — M19041 Primary osteoarthritis, right hand: Secondary | ICD-10-CM | POA: Diagnosis not present

## 2023-08-24 DIAGNOSIS — Z8639 Personal history of other endocrine, nutritional and metabolic disease: Secondary | ICD-10-CM

## 2023-08-24 DIAGNOSIS — M19042 Primary osteoarthritis, left hand: Secondary | ICD-10-CM

## 2023-08-24 DIAGNOSIS — Z8679 Personal history of other diseases of the circulatory system: Secondary | ICD-10-CM

## 2023-08-24 DIAGNOSIS — M47816 Spondylosis without myelopathy or radiculopathy, lumbar region: Secondary | ICD-10-CM

## 2023-08-24 DIAGNOSIS — M5030 Other cervical disc degeneration, unspecified cervical region: Secondary | ICD-10-CM

## 2023-08-24 DIAGNOSIS — M19071 Primary osteoarthritis, right ankle and foot: Secondary | ICD-10-CM

## 2023-08-24 DIAGNOSIS — Z8546 Personal history of malignant neoplasm of prostate: Secondary | ICD-10-CM

## 2023-08-24 DIAGNOSIS — G252 Other specified forms of tremor: Secondary | ICD-10-CM

## 2023-08-24 NOTE — Patient Instructions (Signed)
 Standing Labs We placed an order today for your standing lab work.   Please have your standing labs drawn in April and every 3 months  Please have your labs drawn 2 weeks prior to your appointment so that the provider can discuss your lab results at your appointment, if possible.  Please note that you may see your imaging and lab results in MyChart before we have reviewed them. We will contact you once all results are reviewed. Please allow our office up to 72 hours to thoroughly review all of the results before contacting the office for clarification of your results.  WALK-IN LAB HOURS  Monday through Thursday from 8:00 am -12:30 pm and 1:00 pm-5:00 pm and Friday from 8:00 am-12:00 pm.  Patients with office visits requiring labs will be seen before walk-in labs.  You may encounter longer than normal wait times. Please allow additional time. Wait times may be shorter on  Monday and Thursday afternoons.  We do not book appointments for walk-in labs. We appreciate your patience and understanding with our staff.   Labs are drawn by [facility]. Please bring your co-pay at the time of your lab draw.  You may receive a bill from [facility] for your lab work.  Please note if you are on Hydroxychloroquine and and an order has been placed for a Hydroxychloroquine level,  you will need to have it drawn 4 hours or more after your last dose.  If you wish to have your labs drawn at another location, please call the office 24 hours in advance so we can fax the orders.  The office is located at [address], [place], [place] [address]   If you have any questions regarding directions or hours of operation,  please call [phone].   As a reminder, please drink plenty of water prior to coming for your lab work. Thanks!   Vaccines You are taking a medication(s) that can suppress your immune system.  The following immunizations are recommended: Flu annually Covid-19  RSV Td/Tdap (tetanus,  diphtheria, pertussis) every 10 years Pneumonia (Prevnar 15 then Pneumovax 23 at least 1 year apart.  Alternatively, can take Prevnar 20 without needing additional dose) Shingrix: 2 doses from 4 weeks to 6 months apart  Please check with your PCP to make sure you are up to date.  If you have signs or symptoms of an infection or start antibiotics: First, call your PCP for workup of your infection. Hold your medication through the infection, until you complete your antibiotics, and until symptoms resolve if you take the following: Injectable medication (Actemra, Benlysta, Cimzia, Cosentyx, Enbrel, Humira, Kevzara, Orencia, Remicade, Simponi, Stelara, Taltz, Tremfya) Methotrexate Leflunomide (Arava) Mycophenolate (Cellcept) Harriette Ohara, Olumiant, or Rinvoq

## 2023-09-24 ENCOUNTER — Other Ambulatory Visit: Payer: Self-pay | Admitting: Student

## 2023-09-24 DIAGNOSIS — Z8546 Personal history of malignant neoplasm of prostate: Secondary | ICD-10-CM

## 2023-10-04 ENCOUNTER — Ambulatory Visit: Payer: Medicare Other

## 2023-10-04 VITALS — Ht 75.0 in | Wt 225.0 lb

## 2023-10-04 DIAGNOSIS — Z0000 Encounter for general adult medical examination without abnormal findings: Secondary | ICD-10-CM
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
## 2023-10-04 NOTE — Patient Instructions (Addendum)
 Eric Holloway , Thank you for taking time to come for your Medicare Wellness Visit. I appreciate your ongoing commitment to your health goals. Please review the following plan we discussed and let me know if I can assist you in the future.   Referrals/Orders/Follow-Ups/Clinician Recommendations: Yes; Keep maintaining your health by keeping your appointments with Dr. Elliot Gurney and any specialists that you may see.  Call us if you need anything.  Have a great year!!!!  This is a list of the screening recommended for you and due dates:  Health Maintenance  Topic Date Due   DTaP/Tdap/Td vaccine (1 - Tdap) Never done   COVID-19 Vaccine (6 - Pfizer risk 2024-25 season) 09/16/2023   Yearly kidney health urinalysis for diabetes  12/17/2023   Complete foot exam   12/17/2023   Hemoglobin A1C  12/27/2023   Eye exam for diabetics  04/15/2024   Yearly kidney function blood test for diabetes  07/19/2024   Medicare Annual Wellness Visit  10/03/2024   Colon Cancer Screening  06/25/2026   Pneumonia Vaccine  Completed   Flu Shot  Completed   Hepatitis C Screening  Completed   Zoster (Shingles) Vaccine  Completed   HPV Vaccine  Aged Out    Advanced directives: (Declined) Advance directive discussed with you today. Even though you declined this today, please call our office should you change your mind, and we can give you the proper paperwork for you to fill out.  Next Medicare Annual Wellness Visit scheduled for next year: Yes

## 2023-10-06 ENCOUNTER — Encounter: Payer: Self-pay | Admitting: Student

## 2023-10-06 ENCOUNTER — Ambulatory Visit (INDEPENDENT_AMBULATORY_CARE_PROVIDER_SITE_OTHER): Admitting: Student

## 2023-10-06 VITALS — BP 112/95 | HR 73 | Ht 75.0 in | Wt 225.6 lb

## 2023-10-06 DIAGNOSIS — R5383 Other fatigue: Secondary | ICD-10-CM | POA: Diagnosis not present

## 2023-10-06 DIAGNOSIS — E119 Type 2 diabetes mellitus without complications: Secondary | ICD-10-CM

## 2023-10-06 DIAGNOSIS — Z7984 Long term (current) use of oral hypoglycemic drugs: Secondary | ICD-10-CM

## 2023-10-06 LAB — POCT GLYCOSYLATED HEMOGLOBIN (HGB A1C): HbA1c, POC (controlled diabetic range): 6.6 % (ref 0.0–7.0)

## 2023-10-06 NOTE — Progress Notes (Signed)
    SUBJECTIVE:   CHIEF COMPLAINT / HPI:   73 year old male with a history of rheumatoid arthritis managed with methotrexate and folic acid, presents with ongoing weakness and fatigue. He reports a lack of energy, particularly after being active for about two hours. He denies any changes in stool color or presence of blood. He has noticed some weight loss, but his appetite remains good. He also mentions that he has taken few tablet of his wife's iron supplement in the last week. Elevated ferritin historically, 4 years ago 447  PERTINENT  PMH / PSH: Reviewed    OBJECTIVE:   BP (!) 112/95   Pulse 73   Ht 6\' 3"  (1.905 m)   Wt 225 lb 9.6 oz (102.3 kg)   SpO2 100%   BMI 28.20 kg/m    Physical Exam General: Alert, well appearing, NAD, mild tremors  Cardiovascular: RRR, No Murmurs, Normal S2/S2 Respiratory: CTAB, No wheezing or Rales Abdomen: No distension or tenderness Extremities: No edema on extremities    ASSESSMENT/PLAN:   Generalized weakness Weakness and fatigue with history of low hemoglobin. No concerns for gastrointestinal bleeding. Differential includes iron and folate deficiency anemia, especially due to methotrexate use. - Order blood tests for TSH, CBC, iron, and folic acid levels. - Await results before starting iron supplementation. - Consider iron supplementation if iron deficiency confirmed. - Consider folic acid supplementation adjustment if folate deficiency confirmed.  General Health Maintenance Due for Tdap vaccination. COVID vaccination not up to date. Colonoscopy not due until 2027. Quit smoking at age 43. - Tdap vaccination to be administered at patient's pharmacy   Jerre Simon, MD [facility]

## 2023-10-06 NOTE — Patient Instructions (Signed)
 Pleasure to see you today.  Your A1c obtained today was 6.6% which is good.  Today we ordered lab to check your blood count, iron level and folic acid level.  I also checked your thyroid level as well today.  We will follow-up with you with the results.  For your tetanus shot please go to your pharmacy to have these completed.  As your insurance will cover it there.

## 2023-10-07 LAB — CBC
Hematocrit: 41.3 % (ref 37.5–51.0)
Hemoglobin: 13 g/dL (ref 13.0–17.7)
MCH: 28.7 pg (ref 26.6–33.0)
MCHC: 31.5 g/dL (ref 31.5–35.7)
MCV: 91 fL (ref 79–97)
Platelets: 222 10*3/uL (ref 150–450)
RBC: 4.53 x10E6/uL (ref 4.14–5.80)
RDW: 14.4 % (ref 11.6–15.4)
WBC: 9.1 10*3/uL (ref 3.4–10.8)

## 2023-10-07 LAB — IRON,TIBC AND FERRITIN PANEL
Ferritin: 50 ng/mL (ref 30–400)
Iron Saturation: 27 % (ref 15–55)
Iron: 82 ug/dL (ref 38–169)
Total Iron Binding Capacity: 305 ug/dL (ref 250–450)
UIBC: 223 ug/dL (ref 111–343)

## 2023-10-07 LAB — TSH: TSH: 3.17 u[IU]/mL (ref 0.450–4.500)

## 2023-10-07 NOTE — Telephone Encounter (Signed)
 Called patient to follow up on Lab result. Informed him his recent lab showed normal Hgb, iron and TSH levels. His generalized fatigue could be decondition and recommend graduated exercise with plans to ramp up. Patient verbalized understand. Return precautions discussed.

## 2023-10-12 ENCOUNTER — Other Ambulatory Visit: Payer: Self-pay | Admitting: *Deleted

## 2023-10-12 DIAGNOSIS — Z79899 Other long term (current) drug therapy: Secondary | ICD-10-CM

## 2023-10-13 ENCOUNTER — Other Ambulatory Visit: Payer: Self-pay | Admitting: Rheumatology

## 2023-10-13 ENCOUNTER — Telehealth: Payer: Self-pay | Admitting: Rheumatology

## 2023-10-13 DIAGNOSIS — M0579 Rheumatoid arthritis with rheumatoid factor of multiple sites without organ or systems involvement: Secondary | ICD-10-CM

## 2023-10-13 LAB — COMPREHENSIVE METABOLIC PANEL WITH GFR
AG Ratio: 1.4 (calc) (ref 1.0–2.5)
ALT: 6 U/L — ABNORMAL LOW (ref 9–46)
AST: 10 U/L (ref 10–35)
Albumin: 3.9 g/dL (ref 3.6–5.1)
Alkaline phosphatase (APISO): 56 U/L (ref 35–144)
BUN: 18 mg/dL (ref 7–25)
CO2: 30 mmol/L (ref 20–32)
Calcium: 9.4 mg/dL (ref 8.6–10.3)
Chloride: 102 mmol/L (ref 98–110)
Creat: 1.08 mg/dL (ref 0.70–1.28)
Globulin: 2.7 g/dL (ref 1.9–3.7)
Glucose, Bld: 123 mg/dL — ABNORMAL HIGH (ref 65–99)
Potassium: 4 mmol/L (ref 3.5–5.3)
Sodium: 140 mmol/L (ref 135–146)
Total Bilirubin: 0.6 mg/dL (ref 0.2–1.2)
Total Protein: 6.6 g/dL (ref 6.1–8.1)
eGFR: 72 mL/min/{1.73_m2} (ref >=60)

## 2023-10-13 LAB — CBC WITH DIFFERENTIAL/PLATELET
Absolute Lymphocytes: 1263 {cells}/uL (ref 850–3900)
Absolute Monocytes: 555 {cells}/uL (ref 200–950)
Basophils Absolute: 29 {cells}/uL (ref 0–200)
Basophils Relative: 0.4 %
Eosinophils Absolute: 58 {cells}/uL (ref 15–500)
Eosinophils Relative: 0.8 %
HCT: 40.4 % (ref 38.5–50.0)
Hemoglobin: 13 g/dL — ABNORMAL LOW (ref 13.2–17.1)
MCH: 29.2 pg (ref 27.0–33.0)
MCHC: 32.2 g/dL (ref 32.0–36.0)
MCV: 90.8 fL (ref 80.0–100.0)
MPV: 11.3 fL (ref 7.5–12.5)
Monocytes Relative: 7.6 %
Neutro Abs: 5395 {cells}/uL (ref 1500–7800)
Neutrophils Relative %: 73.9 %
Platelets: 243 10*3/uL (ref 140–400)
RBC: 4.45 10*6/uL (ref 4.20–5.80)
RDW: 14.4 % (ref 11.0–15.0)
Total Lymphocyte: 17.3 %
WBC: 7.3 10*3/uL (ref 3.8–10.8)

## 2023-10-13 NOTE — Telephone Encounter (Signed)
 Patient contacted the office to request a medication refill.   1. Name of Medication: Methotrexate  2. How are you currently taking this medication (dosage and times per day)? 4 tablets a week   3. What pharmacy would you like for that to be sent to? [facility] [address]

## 2023-10-13 NOTE — Telephone Encounter (Signed)
 Last Fill: 07/20/2023  Labs: 10/12/2023 Glucose is mildly elevated, probably not a fasting sample.  CBC is stable hemoglobin is mildly decreased at 13.   Next Visit: 01/25/2024  Last Visit: 08/24/2023  DX: Rheumatoid arthritis involving multiple sites with positive rheumatoid factor   Current Dose per office note 08/24/2023: Methotrexate 2.5 mg 4 tablets twice a week on Mondays and Tuesdays only   Okay to refill Methotrexate?

## 2023-10-13 NOTE — Progress Notes (Signed)
 Glucose is mildly elevated, probably not a fasting sample.  CBC is stable hemoglobin is mildly decreased at 13.  Please forward results to his PCP.

## 2023-10-13 NOTE — Telephone Encounter (Signed)
See rx note for details.  °

## 2023-10-27 ENCOUNTER — Telehealth: Payer: Self-pay | Admitting: Student

## 2023-10-27 ENCOUNTER — Encounter: Payer: Self-pay | Admitting: Student

## 2023-10-27 ENCOUNTER — Other Ambulatory Visit: Payer: Self-pay | Admitting: Student

## 2023-10-27 DIAGNOSIS — L237 Allergic contact dermatitis due to plants, except food: Secondary | ICD-10-CM

## 2023-10-27 MED ORDER — FLUTICASONE PROPIONATE 50 MCG/ACT NA SUSP: 2.0000 | Freq: Every day | NASAL | 1 refills | Status: AC

## 2023-10-27 MED ORDER — FEXOFENADINE HCL 180 MG PO TABS
180.0000 mg | ORAL_TABLET | Freq: Every day | ORAL | 0 refills | Status: AC
Start: 2023-10-27 — End: ?

## 2023-10-27 NOTE — Telephone Encounter (Signed)
 Patient came into office asking for a refill on his Flonase  nasal spray.   Patient would like for refill to be sent to [facility] [address]  Please Advise.   Thanks!

## 2023-10-27 NOTE — Progress Notes (Signed)
Refilled patient's Flonase.

## 2023-12-04 ENCOUNTER — Other Ambulatory Visit: Payer: Self-pay | Admitting: Student

## 2023-12-04 DIAGNOSIS — E118 Type 2 diabetes mellitus with unspecified complications: Secondary | ICD-10-CM

## 2023-12-22 ENCOUNTER — Other Ambulatory Visit: Payer: Self-pay | Admitting: Student

## 2023-12-22 DIAGNOSIS — Z8546 Personal history of malignant neoplasm of prostate: Secondary | ICD-10-CM

## 2023-12-27 NOTE — Patient Instructions (Signed)
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
## 2023-12-28 ENCOUNTER — Ambulatory Visit: Payer: Medicare Other | Admitting: Family Medicine

## 2023-12-28 ENCOUNTER — Encounter: Payer: Self-pay | Admitting: Family Medicine

## 2023-12-28 VITALS — BP 139/60 | HR 72 | Ht 75.0 in | Wt 226.4 lb

## 2023-12-28 DIAGNOSIS — G8929 Other chronic pain: Secondary | ICD-10-CM | POA: Diagnosis not present

## 2023-12-28 DIAGNOSIS — G20A2 Parkinson's disease without dyskinesia, with fluctuations: Secondary | ICD-10-CM | POA: Diagnosis not present

## 2023-12-28 DIAGNOSIS — M545 Low back pain, unspecified: Secondary | ICD-10-CM

## 2023-12-28 MED ORDER — CARBIDOPA-LEVODOPA 25-100 MG PO TABS: 1.5000 | ORAL_TABLET | Freq: Four times a day (QID) | ORAL | 3 refills | Status: AC

## 2024-01-04 ENCOUNTER — Other Ambulatory Visit: Payer: Self-pay | Admitting: *Deleted

## 2024-01-04 DIAGNOSIS — Z79899 Other long term (current) drug therapy: Secondary | ICD-10-CM

## 2024-01-05 ENCOUNTER — Ambulatory Visit: Payer: Self-pay | Admitting: Rheumatology

## 2024-01-05 ENCOUNTER — Other Ambulatory Visit: Payer: Self-pay | Admitting: Rheumatology

## 2024-01-05 DIAGNOSIS — M0579 Rheumatoid arthritis with rheumatoid factor of multiple sites without organ or systems involvement: Secondary | ICD-10-CM

## 2024-01-05 LAB — COMPREHENSIVE METABOLIC PANEL WITH GFR
AG Ratio: 1.5 (calc) (ref 1.0–2.5)
ALT: 3 U/L — ABNORMAL LOW (ref 9–46)
AST: 13 U/L (ref 10–35)
Albumin: 3.8 g/dL (ref 3.6–5.1)
Alkaline phosphatase (APISO): 58 U/L (ref 35–144)
BUN: 18 mg/dL (ref 7–25)
CO2: 32 mmol/L (ref 20–32)
Calcium: 9.3 mg/dL (ref 8.6–10.3)
Chloride: 101 mmol/L (ref 98–110)
Creat: 1.11 mg/dL (ref 0.70–1.28)
Globulin: 2.6 g/dL (ref 1.9–3.7)
Glucose, Bld: 147 mg/dL — ABNORMAL HIGH (ref 65–99)
Potassium: 4.1 mmol/L (ref 3.5–5.3)
Sodium: 140 mmol/L (ref 135–146)
Total Bilirubin: 0.6 mg/dL (ref 0.2–1.2)
Total Protein: 6.4 g/dL (ref 6.1–8.1)
eGFR: 70 mL/min/{1.73_m2} (ref >=60)

## 2024-01-05 LAB — CBC WITH DIFFERENTIAL/PLATELET
Absolute Lymphocytes: 1378 {cells}/uL (ref 850–3900)
Absolute Monocytes: 608 {cells}/uL (ref 200–950)
Basophils Absolute: 31 {cells}/uL (ref 0–200)
Basophils Relative: 0.4 %
Eosinophils Absolute: 62 {cells}/uL (ref 15–500)
Eosinophils Relative: 0.8 %
HCT: 39.6 % (ref 38.5–50.0)
Hemoglobin: 12.6 g/dL — ABNORMAL LOW (ref 13.2–17.1)
MCH: 29 pg (ref 27.0–33.0)
MCHC: 31.8 g/dL — ABNORMAL LOW (ref 32.0–36.0)
MCV: 91 fL (ref 80.0–100.0)
MPV: 10.7 fL (ref 7.5–12.5)
Monocytes Relative: 7.9 %
Neutro Abs: 5621 {cells}/uL (ref 1500–7800)
Neutrophils Relative %: 73 %
Platelets: 206 Thousand/uL (ref 140–400)
RBC: 4.35 Million/uL (ref 4.20–5.80)
RDW: 14.5 % (ref 11.0–15.0)
Total Lymphocyte: 17.9 %
WBC: 7.7 10*3/uL (ref 3.8–10.8)

## 2024-01-05 NOTE — Progress Notes (Signed)
 Hemoglobin is low and stable at 12.6, glucose is elevated at 147.  Please forward results to his PCP.

## 2024-01-05 NOTE — Telephone Encounter (Signed)
 Last Fill: 10/13/2023  Labs: 01/04/2024 Hemoglobin is low and stable at 12.6, glucose is elevated at 147   Next Visit: 01/25/2024  Last Visit: 08/24/2023  DX: Rheumatoid arthritis involving multiple sites with positive rheumatoid factor   Current Dose per office note 08/24/2023: Methotrexate  2.5 mg 4 tablets twice a week on Mondays and Tuesdays   Okay to refill Methotrexate ?
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
## 2024-01-25 ENCOUNTER — Ambulatory Visit: Payer: Medicare Other | Attending: Rheumatology | Admitting: Rheumatology

## 2024-01-25 ENCOUNTER — Encounter: Payer: Self-pay | Admitting: Rheumatology

## 2024-01-25 VITALS — BP 104/61 | HR 66 | Resp 15 | Ht 75.0 in | Wt 221.2 lb

## 2024-01-25 DIAGNOSIS — M0579 Rheumatoid arthritis with rheumatoid factor of multiple sites without organ or systems involvement: Secondary | ICD-10-CM

## 2024-01-25 DIAGNOSIS — K1379 Other lesions of oral mucosa: Secondary | ICD-10-CM

## 2024-01-25 DIAGNOSIS — R519 Headache, unspecified: Secondary | ICD-10-CM

## 2024-01-25 DIAGNOSIS — M25561 Pain in right knee: Secondary | ICD-10-CM

## 2024-01-25 DIAGNOSIS — M19071 Primary osteoarthritis, right ankle and foot: Secondary | ICD-10-CM

## 2024-01-25 DIAGNOSIS — M19042 Primary osteoarthritis, left hand: Secondary | ICD-10-CM

## 2024-01-25 DIAGNOSIS — Z79899 Other long term (current) drug therapy: Secondary | ICD-10-CM

## 2024-01-25 DIAGNOSIS — Z8669 Personal history of other diseases of the nervous system and sense organs: Secondary | ICD-10-CM

## 2024-01-25 DIAGNOSIS — M47816 Spondylosis without myelopathy or radiculopathy, lumbar region: Secondary | ICD-10-CM

## 2024-01-25 DIAGNOSIS — M19072 Primary osteoarthritis, left ankle and foot: Secondary | ICD-10-CM

## 2024-01-25 DIAGNOSIS — E785 Hyperlipidemia, unspecified: Secondary | ICD-10-CM

## 2024-01-25 DIAGNOSIS — G8929 Other chronic pain: Secondary | ICD-10-CM

## 2024-01-25 DIAGNOSIS — Z8639 Personal history of other endocrine, nutritional and metabolic disease: Secondary | ICD-10-CM

## 2024-01-25 DIAGNOSIS — M19041 Primary osteoarthritis, right hand: Secondary | ICD-10-CM | POA: Diagnosis not present

## 2024-01-25 DIAGNOSIS — M5030 Other cervical disc degeneration, unspecified cervical region: Secondary | ICD-10-CM

## 2024-01-25 DIAGNOSIS — Z8546 Personal history of malignant neoplasm of prostate: Secondary | ICD-10-CM

## 2024-01-25 DIAGNOSIS — Z87448 Personal history of other diseases of urinary system: Secondary | ICD-10-CM

## 2024-01-25 DIAGNOSIS — Z8679 Personal history of other diseases of the circulatory system: Secondary | ICD-10-CM

## 2024-01-25 NOTE — Patient Instructions (Signed)
 Standing Labs We placed an order today for your standing lab work.   Please have your standing labs drawn in  October and every 3 months  Please have your labs drawn 2 weeks prior to your appointment so that the provider can discuss your lab results at your appointment, if possible.  Please note that you may see your imaging and lab results in MyChart before we have reviewed them. We will contact you once all results are reviewed. Please allow our office up to 72 hours to thoroughly review all of the results before contacting the office for clarification of your results.  WALK-IN LAB HOURS  Monday through Thursday from 8:00 am -12:30 pm and 1:00 pm-4:30 pm and Friday from 8:00 am-12:00 pm.  Patients with office visits requiring labs will be seen before walk-in labs.  You may encounter longer than normal wait times. Please allow additional time. Wait times may be shorter on  Monday and Thursday afternoons.  We do not book appointments for walk-in labs. We appreciate your patience and understanding with our staff.   Labs are drawn by [facility]. Please bring your co-pay at the time of your lab draw.  You may receive a bill from [facility] for your lab work.  Please note if you are on Hydroxychloroquine and and an order has been placed for a Hydroxychloroquine level,  you will need to have it drawn 4 hours or more after your last dose.  If you wish to have your labs drawn at another location, please call the office 24 hours in advance so we can fax the orders.  The office is located at [address], [place], [place] [address]   If you have any questions regarding directions or hours of operation,  please call [phone].   As a reminder, please drink plenty of water  prior to coming for your lab work. Thanks!  Vaccines You are taking a medication(s) that can suppress your immune system.  The following immunizations are recommended: Flu annually Covid-19  Td/Tdap (tetanus,  diphtheria, pertussis) every 10 years Pneumonia (Prevnar 15 then Pneumovax 23 at least 1 year apart.  Alternatively, can take Prevnar 20 without needing additional dose) Shingrix: 2 doses from 4 weeks to 6 months apart  Please check with your PCP to make sure you are up to date.   If you have signs or symptoms of an infection or start antibiotics: First, call your PCP for workup of your infection. Hold your medication through the infection, until you complete your antibiotics, and until symptoms resolve if you take the following: Injectable medication (Actemra, Benlysta, Cimzia, Cosentyx, Enbrel, Humira, Kevzara, Orencia , Remicade, Simponi, Stelara, Taltz, Tremfya) Methotrexate  Leflunomide  (Arava ) Mycophenolate (Cellcept) Xeljanz, Olumiant, or Rinvoq

## 2024-03-17 ENCOUNTER — Other Ambulatory Visit: Payer: Self-pay | Admitting: Student

## 2024-03-17 DIAGNOSIS — Z8546 Personal history of malignant neoplasm of prostate: Secondary | ICD-10-CM

## 2024-03-19 ENCOUNTER — Other Ambulatory Visit: Payer: Self-pay | Admitting: Student

## 2024-03-29 ENCOUNTER — Other Ambulatory Visit: Payer: Self-pay | Admitting: Rheumatology

## 2024-03-29 ENCOUNTER — Other Ambulatory Visit: Payer: Self-pay | Admitting: *Deleted

## 2024-03-29 DIAGNOSIS — Z79899 Other long term (current) drug therapy: Secondary | ICD-10-CM

## 2024-03-29 DIAGNOSIS — M0579 Rheumatoid arthritis with rheumatoid factor of multiple sites without organ or systems involvement: Secondary | ICD-10-CM

## 2024-03-29 DIAGNOSIS — E785 Hyperlipidemia, unspecified: Secondary | ICD-10-CM

## 2024-03-29 LAB — COMPREHENSIVE METABOLIC PANEL WITH GFR
AG Ratio: 1.5 (calc) (ref 1.0–2.5)
ALT: 9 U/L (ref 9–46)
AST: 17 U/L (ref 10–35)
Albumin: 3.7 g/dL (ref 3.6–5.1)
Alkaline phosphatase (APISO): 54 U/L (ref 35–144)
BUN: 17 mg/dL (ref 7–25)
CO2: 29 mmol/L (ref 20–32)
Calcium: 9.3 mg/dL (ref 8.6–10.3)
Chloride: 102 mmol/L (ref 98–110)
Creat: 0.97 mg/dL (ref 0.70–1.28)
Globulin: 2.5 g/dL (ref 1.9–3.7)
Glucose, Bld: 259 mg/dL — ABNORMAL HIGH (ref 65–99)
Potassium: 3.6 mmol/L (ref 3.5–5.3)
Sodium: 140 mmol/L (ref 135–146)
Total Bilirubin: 0.4 mg/dL (ref 0.2–1.2)
Total Protein: 6.2 g/dL (ref 6.1–8.1)
eGFR: 82 mL/min/1.73m2 (ref >=60)

## 2024-03-29 LAB — CBC WITH DIFFERENTIAL/PLATELET
Absolute Lymphocytes: 1215 {cells}/uL (ref 850–3900)
Absolute Monocytes: 423 {cells}/uL (ref 200–950)
Basophils Absolute: 27 {cells}/uL (ref 0–200)
Basophils Relative: 0.3 %
Eosinophils Absolute: 63 {cells}/uL (ref 15–500)
Eosinophils Relative: 0.7 %
HCT: 41.1 % (ref 38.5–50.0)
Hemoglobin: 13 g/dL — ABNORMAL LOW (ref 13.2–17.1)
MCH: 29.4 pg (ref 27.0–33.0)
MCHC: 31.6 g/dL — ABNORMAL LOW (ref 32.0–36.0)
MCV: 93 fL (ref 80.0–100.0)
MPV: 11.7 fL (ref 7.5–12.5)
Monocytes Relative: 4.7 %
Neutro Abs: 7272 {cells}/uL (ref 1500–7800)
Neutrophils Relative %: 80.8 %
Platelets: 205 Thousand/uL (ref 140–400)
RBC: 4.42 Million/uL (ref 4.20–5.80)
RDW: 14.2 % (ref 11.0–15.0)
Total Lymphocyte: 13.5 %
WBC: 9 Thousand/uL (ref 3.8–10.8)

## 2024-03-29 LAB — LIPID PANEL
Cholesterol: 128 mg/dL (ref <200)
HDL: 35 mg/dL — ABNORMAL LOW (ref >=40)
LDL Cholesterol (Calc): 62 mg/dL
Non-HDL Cholesterol (Calc): 93 mg/dL (ref <130)
Total CHOL/HDL Ratio: 3.7 (calc) (ref <5.0)
Triglycerides: 263 mg/dL — ABNORMAL HIGH (ref <150)

## 2024-03-29 NOTE — Telephone Encounter (Signed)
 Last Fill: 01/05/2024  Labs: 01/04/2024 Hemoglobin is low and stable at 12.6, glucose is elevated at 147.   Next Visit: 06/27/2024  Last Visit: 01/25/2024  DX: Rheumatoid arthritis involving multiple sites with positive rheumatoid factor   Current Dose per office note on 01/25/2024: Methotrexate  2.5 mg 4 tablets twice a week on Mondays and Tuesdays only   Okay to refill Methotrexate ?

## 2024-03-30 ENCOUNTER — Ambulatory Visit: Payer: Self-pay | Admitting: Rheumatology

## 2024-03-30 NOTE — Progress Notes (Signed)
 Triglycerides are elevated and HDL (good cholesterol) is low.  Glucose is elevated at 259.  Hemoglobin is low and stable.  Please notify patient and forward results to his PCP.

## 2024-04-17 ENCOUNTER — Ambulatory Visit (INDEPENDENT_AMBULATORY_CARE_PROVIDER_SITE_OTHER): Admitting: Family Medicine

## 2024-04-17 ENCOUNTER — Other Ambulatory Visit: Payer: Self-pay | Admitting: Student

## 2024-04-17 VITALS — BP 126/58 | HR 74 | Temp 98.0°F | Wt 224.8 lb

## 2024-04-17 DIAGNOSIS — I152 Hypertension secondary to endocrine disorders: Secondary | ICD-10-CM

## 2024-04-17 DIAGNOSIS — R42 Dizziness and giddiness: Secondary | ICD-10-CM

## 2024-04-17 DIAGNOSIS — E785 Hyperlipidemia, unspecified: Secondary | ICD-10-CM

## 2024-04-17 DIAGNOSIS — E1159 Type 2 diabetes mellitus with other circulatory complications: Secondary | ICD-10-CM

## 2024-04-17 DIAGNOSIS — Z1211 Encounter for screening for malignant neoplasm of colon: Secondary | ICD-10-CM

## 2024-04-17 DIAGNOSIS — E782 Mixed hyperlipidemia: Secondary | ICD-10-CM | POA: Diagnosis not present

## 2024-04-17 DIAGNOSIS — I872 Venous insufficiency (chronic) (peripheral): Secondary | ICD-10-CM

## 2024-04-17 DIAGNOSIS — Z23 Encounter for immunization: Secondary | ICD-10-CM | POA: Diagnosis not present

## 2024-04-17 DIAGNOSIS — E119 Type 2 diabetes mellitus without complications: Secondary | ICD-10-CM | POA: Diagnosis not present

## 2024-04-17 DIAGNOSIS — D649 Anemia, unspecified: Secondary | ICD-10-CM

## 2024-04-17 LAB — POCT GLYCOSYLATED HEMOGLOBIN (HGB A1C): HbA1c, POC (controlled diabetic range): 6.8 % (ref 0.0–7.0)

## 2024-04-17 MED ORDER — VALSARTAN-HYDROCHLOROTHIAZIDE 80-12.5 MG PO TABS: 0.5000 | ORAL_TABLET | Freq: Every day | ORAL | 1 refills | Status: AC

## 2024-04-17 MED ORDER — DAPAGLIFLOZIN PROPANEDIOL 10 MG PO TABS: 10.0000 mg | ORAL_TABLET | Freq: Every day | ORAL | 11 refills | Status: AC

## 2024-04-17 NOTE — Patient Instructions (Signed)
 It was great to see you today! Thank you for choosing [facility] for your primary care.  Today we addressed: Diabetes The diabetes is well-controlled today.  Anemia, unspecified type We are checking your vitamin and iron levels today.  Venous insufficiency I have given you a prescription for compression stockings which you can bring to any medical supply store to fill.  Colon cancer screening I have referred you for colonoscopy.  Blood pressure Please start cutting your blood pressure medicine (Diovan ) and half from now on, we think that your blood pressure is dropping too much when you stand up and we should go down on the dose.  We are checking some labs today.  You will get a MyChart message or a letter if results are normal. Otherwise, you will get a call from us .  If you had a referral placed, they will call you to set up an appointment. Please give us  a call if you don't hear back in the next 2 weeks.  You should return to our clinic .  Thank you for coming to see us  at [facility] and for the opportunity to care for you! Toma, Dale Strausser, MD 04/17/2024, 2:23 PM

## 2024-04-17 NOTE — Assessment & Plan Note (Addendum)
 A1c today 6.8, goal <7.5 given age. - Continue metformin  and dapagliflozin  as above - Continue statin - Urine microalbumin/creatinine ordered today, but patient unable to provide sample, will return tomorrow, future order placed - Foot exam today unremarkable

## 2024-04-17 NOTE — Assessment & Plan Note (Signed)
 BP well-controlled today.  Orthostatic vitals positive.  This is possible etiology of patient's dizziness. - Decrease valsartan -hydrochlorothiazide  to 1/2 tablet of 80-12.5 mg daily

## 2024-04-17 NOTE — Assessment & Plan Note (Addendum)
 BP well-controlled today.  Orthostatic vitals positive.  This is possible etiology of patient's dizziness. - Decrease valsartan -hydrochlorothiazide  to 1/2 tablet of 80-12.5 mg daily

## 2024-04-17 NOTE — Progress Notes (Signed)
 SUBJECTIVE:   CHIEF COMPLAINT / HPI:  Eric Holloway is a 73 y.o. male with a pertinent past medical history of T2DM, HTN, Hx prostate cancer s/p radiation, and Parkinson's disease presenting to the clinic for follow-up of chronic conditions.  Type 2 diabetes mellitus - Last A1c 6.6 on 4/2 - Home CBGs: Not checking - Medications: Metformin  1000 mg twice daily, empagliflozin  10 mg daily - Adherence: Good - Eye exam: Due, diabetic retinopathy and cataracts noted - Foot exam: Due - Microalbumin: Due - Statin: Atorvastatin  40 mg - No symptoms of hypoglycemia, polyuria, polydipsia, numbness of extremities, foot ulcers/trauma  Parkinson's disease Taking carbidopa -levodopa  1.5 tablets of 25-100 mg QID. Noticed some forgetfulness, but tolerable. Has had some shaking of his arms/hands, but controllable and mild.  Dizziness Patient reports persistent dizziness both at rest and while ambulating. Room does not spin. Sometimes feels like he might fall. Notes ringing in his ears. Symptoms possibly worsened when standing up.  Anemia Noted to have persistent borderline anemia for past few years.   PERTINENT PMH / PSH: T2DM not on insulin  HTN Hx prostate cancer s/p radiation in early 2000s. Parkinson's disease  *Remainder reviewed in problem list.   OBJECTIVE:   BP (!) 126/58   Pulse 74   Temp 98 F (36.7 C)   Wt 224 lb 12.8 oz (102 kg)   SpO2 100%   BMI 28.10 kg/m   General: Age-appropriate, resting comfortably in chair, NAD, alert and at baseline. HEENT: No mucosal pallor.  MMM.  No JVD. Cardiovascular: Regular rate and rhythm. Normal S1/S2. No murmurs, rubs, or gallops appreciated. 2+ radial pulses. Pulmonary: Clear bilaterally to ascultation. No wheezes, crackles, or rhonchi. Normal WOB on room air. No accessory muscle use. Abdominal: No tenderness to deep or light palpation. No rebound or guarding. No HSM. Skin: Warm and dry. Extremities: 1+ peripheral edema  bilaterally at ankles. Capillary refill <2 seconds.  Diabetic Foot Exam - Simple   Simple Foot Form Visual Inspection No deformities, no ulcerations, no other skin breakdown bilaterally: Yes Sensation Testing Intact to touch and monofilament testing bilaterally: Yes Pulse Check Posterior Tibialis and Dorsalis pulse intact bilaterally: Yes Comments Diabetic foot exam with monofilament shows intact sensation and discrimination over all toes and plantar surfaces of feet bilaterally.  No ulcers or calluses present.     Results for orders placed or performed in visit on 04/17/24 (from the past 48 hours)  POCT glycosylated hemoglobin (Hb A1C)     Status: None   Collection Time: 04/17/24  2:06 PM  Result Value Ref Range   Hemoglobin A1C     HbA1c POC (<> result, manual entry)     HbA1c, POC (prediabetic range)     HbA1c, POC (controlled diabetic range) 6.8 0.0 - 7.0 %     ASSESSMENT/PLAN:   Assessment & Plan Type 2 diabetes mellitus without complication, without long-term current use of insulin  (HCC) Mixed hyperlipidemia A1c today 6.8, goal <7.5 given age. - Continue metformin  and dapagliflozin  as above - Continue statin - Urine microalbumin/creatinine ordered today, but patient unable to provide sample, will return tomorrow, future order placed - Foot exam today unremarkable Hypertension associated with diabetes (HCC) Dizziness BP well-controlled today.  Orthostatic vitals positive.  This is possible etiology of patient's dizziness. - Decrease valsartan -hydrochlorothiazide  to 1/2 tablet of 80-12.5 mg daily Anemia, unspecified type CBC 2 weeks ago with borderline anemia 13.0, suspicious for malignancy given age and sex.  Of note, this is a chronic issue going  back several years.  Anemia is normocytic, lower concern for folate or B12 deficiency.  However, given patient's dizziness and sensation of disequilibrium we will check folate and B12. - Colonoscopy referral placed as below -  Iron, TIBC, ferritin, folate, B12 Venous insufficiency Like etiology of mild lower extremity edema.  No evidence of heart failure. - Provided DME form for compression stockings Colon cancer screening Given persistent anemia and age less than 33, will refer for colonoscopy. - Ambulatory referral to GI Encounter for immunization Influenza vaccine administered.  Return in 6 months or sooner if needed.  Macarius Ruark Toma, MD [facility]

## 2024-04-17 NOTE — Assessment & Plan Note (Addendum)
 CBC 2 weeks ago with borderline anemia 13.0, suspicious for malignancy given age and sex.  Of note, this is a chronic issue going back several years.  Anemia is normocytic, lower concern for folate or B12 deficiency.  However, given patient's dizziness and sensation of disequilibrium we will check folate and B12. - Colonoscopy referral placed as below - Iron, TIBC, ferritin, folate, B12

## 2024-04-18 ENCOUNTER — Telehealth: Payer: Self-pay | Admitting: Family Medicine

## 2024-04-18 ENCOUNTER — Other Ambulatory Visit

## 2024-04-18 NOTE — Addendum Note (Signed)
 Addended by: Rawson Minix L on: 04/18/2024 09:04 AM   Modules accepted: Orders

## 2024-04-18 NOTE — Telephone Encounter (Signed)
 Lenis R. Dowdell request refill of:  Name of Medication(s):  Astrovastatin Last date of OV:  04/17/2024 Pharmacy:  [facility], [place], [address]  Will route refill request to Team CMA.  Discussed with patient policy to call pharmacy for future refills.  Also, discussed refills may take up to 48 hours to approve or deny.  Chiquita POUR [place]

## 2024-04-19 ENCOUNTER — Other Ambulatory Visit

## 2024-04-19 DIAGNOSIS — E119 Type 2 diabetes mellitus without complications: Secondary | ICD-10-CM

## 2024-04-20 LAB — MICROALBUMIN / CREATININE URINE RATIO
Creatinine, Urine: 80.2 mg/dL
Microalb/Creat Ratio: 17 mg/g{creat} (ref 0–29)
Microalbumin, Urine: 13.7 ug/mL

## 2024-04-20 LAB — FOLATE: Folate: >20 ng/mL (ref >3.0)

## 2024-04-20 LAB — VITAMIN B12: Vitamin B-12: 855 pg/mL (ref 232–1245)

## 2024-04-20 LAB — FERRITIN: Ferritin: 90 ng/mL (ref 30–400)

## 2024-04-23 ENCOUNTER — Ambulatory Visit: Payer: Self-pay | Admitting: Family Medicine

## 2024-06-13 ENCOUNTER — Other Ambulatory Visit: Payer: Self-pay | Admitting: Family Medicine

## 2024-06-13 DIAGNOSIS — Z8546 Personal history of malignant neoplasm of prostate: Secondary | ICD-10-CM
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
## 2024-06-15 ENCOUNTER — Other Ambulatory Visit: Payer: Self-pay | Admitting: Rheumatology

## 2024-06-15 DIAGNOSIS — M0579 Rheumatoid arthritis with rheumatoid factor of multiple sites without organ or systems involvement: Secondary | ICD-10-CM

## 2024-06-15 NOTE — Telephone Encounter (Signed)
 Last Fill: 03/29/2024  Labs: 03/29/2024 Triglycerides are elevated and HDL (good cholesterol) is low. Glucose is elevated at 259. Hemoglobin is low and stable. Please notify patient and forward results to his PCP.   Next Visit: 06/27/2024  Last Visit: 01/25/2024  DX: Rheumatoid arthritis involving multiple sites with positive rheumatoid factor (HCC)   Current Dose per office note 01/25/2024: Methotrexate  2.5 mg 4 tablets twice a week on Mondays and Tuesdays only   Okay to refill Methotrexate ?

## 2024-06-19 ENCOUNTER — Other Ambulatory Visit: Payer: Self-pay

## 2024-06-19 DIAGNOSIS — Z79899 Other long term (current) drug therapy: Secondary | ICD-10-CM

## 2024-06-20 ENCOUNTER — Ambulatory Visit: Payer: Self-pay | Admitting: Rheumatology

## 2024-06-20 LAB — COMPREHENSIVE METABOLIC PANEL WITH GFR
AG Ratio: 1.5 (calc) (ref 1.0–2.5)
ALT: 3 U/L — ABNORMAL LOW (ref 9–46)
AST: 10 U/L (ref 10–35)
Albumin: 4 g/dL (ref 3.6–5.1)
Alkaline phosphatase (APISO): 48 U/L (ref 35–144)
BUN: 11 mg/dL (ref 7–25)
CO2: 31 mmol/L (ref 20–32)
Calcium: 9.9 mg/dL (ref 8.6–10.3)
Chloride: 103 mmol/L (ref 98–110)
Creat: 0.97 mg/dL (ref 0.70–1.28)
Globulin: 2.7 g/dL (ref 1.9–3.7)
Glucose, Bld: 172 mg/dL — ABNORMAL HIGH (ref 65–99)
Potassium: 3.8 mmol/L (ref 3.5–5.3)
Sodium: 143 mmol/L (ref 135–146)
Total Bilirubin: 0.5 mg/dL (ref 0.2–1.2)
Total Protein: 6.7 g/dL (ref 6.1–8.1)
eGFR: 82 mL/min/1.73m2 (ref >=60)

## 2024-06-20 LAB — CBC WITH DIFFERENTIAL/PLATELET
Absolute Lymphocytes: 1170 {cells}/uL (ref 850–3900)
Absolute Monocytes: 438 {cells}/uL (ref 200–950)
Basophils Absolute: 12 {cells}/uL (ref 0–200)
Basophils Relative: 0.2 %
Eosinophils Absolute: 30 {cells}/uL (ref 15–500)
Eosinophils Relative: 0.5 %
HCT: 41.5 % (ref 39.4–51.1)
Hemoglobin: 13 g/dL — ABNORMAL LOW (ref 13.2–17.1)
MCH: 28.6 pg (ref 27.0–33.0)
MCHC: 31.3 g/dL — ABNORMAL LOW (ref 31.6–35.4)
MCV: 91.2 fL (ref 81.4–101.7)
MPV: 11.3 fL (ref 7.5–12.5)
Monocytes Relative: 7.3 %
Neutro Abs: 4350 {cells}/uL (ref 1500–7800)
Neutrophils Relative %: 72.5 %
Platelets: 211 Thousand/uL (ref 140–400)
RBC: 4.55 Million/uL (ref 4.20–5.80)
RDW: 14 % (ref 11.0–15.0)
Total Lymphocyte: 19.5 %
WBC: 6 Thousand/uL (ref 3.8–10.8)

## 2024-06-20 NOTE — Progress Notes (Signed)
 CBC and CMP are stable.  Glucose remains elevated.

## 2024-06-26 ENCOUNTER — Encounter: Payer: Self-pay | Admitting: Rheumatology

## 2024-06-26 ENCOUNTER — Ambulatory Visit: Attending: Rheumatology | Admitting: Rheumatology

## 2024-06-26 VITALS — BP 134/78 | HR 65 | Temp 97.7°F | Resp 14 | Ht 75.0 in | Wt 221.0 lb

## 2024-06-26 DIAGNOSIS — Z79899 Other long term (current) drug therapy: Secondary | ICD-10-CM | POA: Diagnosis not present

## 2024-06-26 DIAGNOSIS — Z8546 Personal history of malignant neoplasm of prostate: Secondary | ICD-10-CM | POA: Diagnosis not present

## 2024-06-26 DIAGNOSIS — M25561 Pain in right knee: Secondary | ICD-10-CM

## 2024-06-26 DIAGNOSIS — K1379 Other lesions of oral mucosa: Secondary | ICD-10-CM

## 2024-06-26 DIAGNOSIS — M0579 Rheumatoid arthritis with rheumatoid factor of multiple sites without organ or systems involvement: Secondary | ICD-10-CM | POA: Diagnosis not present

## 2024-06-26 DIAGNOSIS — M19041 Primary osteoarthritis, right hand: Secondary | ICD-10-CM

## 2024-06-26 DIAGNOSIS — Z8669 Personal history of other diseases of the nervous system and sense organs: Secondary | ICD-10-CM | POA: Diagnosis not present

## 2024-06-26 DIAGNOSIS — Z8639 Personal history of other endocrine, nutritional and metabolic disease: Secondary | ICD-10-CM | POA: Diagnosis not present

## 2024-06-26 DIAGNOSIS — R519 Headache, unspecified: Secondary | ICD-10-CM

## 2024-06-26 DIAGNOSIS — M47816 Spondylosis without myelopathy or radiculopathy, lumbar region: Secondary | ICD-10-CM

## 2024-06-26 DIAGNOSIS — M5030 Other cervical disc degeneration, unspecified cervical region: Secondary | ICD-10-CM | POA: Diagnosis not present

## 2024-06-26 DIAGNOSIS — M19072 Primary osteoarthritis, left ankle and foot: Secondary | ICD-10-CM

## 2024-06-26 DIAGNOSIS — M19071 Primary osteoarthritis, right ankle and foot: Secondary | ICD-10-CM

## 2024-06-26 DIAGNOSIS — Z8679 Personal history of other diseases of the circulatory system: Secondary | ICD-10-CM | POA: Diagnosis not present

## 2024-06-26 DIAGNOSIS — Z87448 Personal history of other diseases of urinary system: Secondary | ICD-10-CM | POA: Diagnosis not present

## 2024-06-26 DIAGNOSIS — E785 Hyperlipidemia, unspecified: Secondary | ICD-10-CM

## 2024-06-26 DIAGNOSIS — M19042 Primary osteoarthritis, left hand: Secondary | ICD-10-CM

## 2024-06-26 DIAGNOSIS — G8929 Other chronic pain: Secondary | ICD-10-CM

## 2024-06-26 NOTE — Patient Instructions (Signed)
 Standing Labs We placed an order today for your standing lab work.   Please have your standing labs drawn in January and every 3 months  Please have your labs drawn 2 weeks prior to your appointment so that the provider can discuss your lab results at your appointment, if possible.  Please note that you may see your imaging and lab results in MyChart before we have reviewed them. We will contact you once all results are reviewed. Please allow our office up to 72 hours to thoroughly review all of the results before contacting the office for clarification of your results.  WALK-IN LAB HOURS  Monday through Thursday from 8:00 am - 4:30 pm and Friday from 8:00 am-12:00 pm.  Patients with office visits requiring labs will be seen before walk-in labs.  You may encounter longer than normal wait times. Please allow additional time. Wait times may be shorter on  Monday and Thursday afternoons.  We do not book appointments for walk-in labs. We appreciate your patience and understanding with our staff.   Labs are drawn by [facility]. Please bring your co-pay at the time of your lab draw.  You may receive a bill from [facility] for your lab work.  Please note if you are on Hydroxychloroquine and and an order has been placed for a Hydroxychloroquine level,  you will need to have it drawn 4 hours or more after your last dose.  If you wish to have your labs drawn at another location, please call the office 24 hours in advance so we can fax the orders.  The office is located at [address], [address], [place], [place] [address]   If you have any questions regarding directions or hours of operation,  please call [phone].   As a reminder, please drink plenty of water  prior to coming for your lab work. Thanks!   Vaccines You are taking a medication(s) that can suppress your immune system.  The following immunizations are recommended: Flu annually Covid-19  RSV Td/Tdap (tetanus, diphtheria, pertussis)  every 10 years Pneumonia (Prevnar 15 then Pneumovax 23 at least 1 year apart.  Alternatively, can take Prevnar 20 without needing additional dose) Shingrix: 2 doses from 4 weeks to 6 months apart  Please check with your PCP to make sure you are up to date.   If you have signs or symptoms of an infection or start antibiotics: First, call your PCP for workup of your infection. Hold your medication through the infection, until you complete your antibiotics, and until symptoms resolve if you take the following: Injectable medication (Actemra, Benlysta, Cimzia, Cosentyx, Enbrel, Humira, Kevzara, Orencia, Remicade, Simponi, Stelara, Taltz, Tremfya) Methotrexate  Leflunomide (Arava) Mycophenolate (Cellcept) Xeljanz, Olumiant, or Rinvoq
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
## 2024-06-27 ENCOUNTER — Ambulatory Visit: Admitting: Rheumatology

## 2024-06-27 DIAGNOSIS — Z8546 Personal history of malignant neoplasm of prostate: Secondary | ICD-10-CM

## 2024-06-27 DIAGNOSIS — Z8679 Personal history of other diseases of the circulatory system: Secondary | ICD-10-CM

## 2024-06-27 DIAGNOSIS — M19071 Primary osteoarthritis, right ankle and foot: Secondary | ICD-10-CM

## 2024-06-27 DIAGNOSIS — Z79899 Other long term (current) drug therapy: Secondary | ICD-10-CM

## 2024-06-27 DIAGNOSIS — Z8639 Personal history of other endocrine, nutritional and metabolic disease: Secondary | ICD-10-CM

## 2024-06-27 DIAGNOSIS — R519 Headache, unspecified: Secondary | ICD-10-CM

## 2024-06-27 DIAGNOSIS — Z8669 Personal history of other diseases of the nervous system and sense organs: Secondary | ICD-10-CM

## 2024-06-27 DIAGNOSIS — E785 Hyperlipidemia, unspecified: Secondary | ICD-10-CM

## 2024-06-27 DIAGNOSIS — M0579 Rheumatoid arthritis with rheumatoid factor of multiple sites without organ or systems involvement: Secondary | ICD-10-CM

## 2024-06-27 DIAGNOSIS — M5030 Other cervical disc degeneration, unspecified cervical region: Secondary | ICD-10-CM

## 2024-06-27 DIAGNOSIS — K1379 Other lesions of oral mucosa: Secondary | ICD-10-CM

## 2024-06-27 DIAGNOSIS — M47816 Spondylosis without myelopathy or radiculopathy, lumbar region: Secondary | ICD-10-CM

## 2024-06-27 DIAGNOSIS — Z87448 Personal history of other diseases of urinary system: Secondary | ICD-10-CM

## 2024-06-27 DIAGNOSIS — G8929 Other chronic pain: Secondary | ICD-10-CM

## 2024-06-27 DIAGNOSIS — M19041 Primary osteoarthritis, right hand: Secondary | ICD-10-CM

## 2024-07-18 ENCOUNTER — Encounter: Payer: Self-pay | Admitting: Gastroenterology

## 2024-07-19 ENCOUNTER — Encounter: Payer: Self-pay | Admitting: Internal Medicine

## 2024-07-31 ENCOUNTER — Ambulatory Visit: Admitting: Family Medicine

## 2024-08-03 ENCOUNTER — Telehealth: Payer: Self-pay | Admitting: Family Medicine
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
## 2024-08-04 ENCOUNTER — Ambulatory Visit: Admitting: Family Medicine

## 2024-08-04 ENCOUNTER — Encounter: Payer: Self-pay | Admitting: Family Medicine

## 2024-08-04 ENCOUNTER — Telehealth: Payer: Self-pay | Admitting: Family Medicine

## 2024-08-04 VITALS — BP 128/64 | HR 86 | Ht 75.0 in | Wt 221.6 lb

## 2024-08-04 DIAGNOSIS — E1159 Type 2 diabetes mellitus with other circulatory complications: Secondary | ICD-10-CM | POA: Diagnosis not present

## 2024-08-04 DIAGNOSIS — J069 Acute upper respiratory infection, unspecified: Secondary | ICD-10-CM

## 2024-08-04 DIAGNOSIS — E119 Type 2 diabetes mellitus without complications: Secondary | ICD-10-CM

## 2024-08-04 DIAGNOSIS — I152 Hypertension secondary to endocrine disorders: Secondary | ICD-10-CM | POA: Diagnosis not present

## 2024-08-04 MED ORDER — GLUCOSE BLOOD VI STRP: 1.0000 | ORAL_STRIP | 1 refills | Status: DC | PRN

## 2024-08-04 MED ORDER — ACCU-CHEK SOFTCLIX LANCETS MISC: 0 refills | Status: DC

## 2024-08-04 NOTE — Assessment & Plan Note (Signed)
 Well controlled today.  Dizziness and orthostasis resolved after decreasing BP meds in October. - Continue valsartan -hydrochlorothiazide  80-12.5 mg 1/2 tab daily

## 2024-08-04 NOTE — Progress Notes (Signed)
 "  SUBJECTIVE:   CHIEF COMPLAINT / HPI:  Eric Holloway is a 74 y.o. male with a pertinent past medical history of T2DM, HTN, Hx prostate cancer s/p radiation, and Parkinson's disease presenting to the clinic for medication refill.  Hypertension Valsartan -hydrochlorothiazide  decreased to 1/2 tablet of 80-12.5 mg daily on 10/13 due to dizziness and positive orthostatics. Today, patient reports dizziness has resolved.  Type 2 diabetes mellitus - Prior A1c 6.8 on 04/17/24 - Home CBGs: Not checking - Medications: Metformin  1000 mg BID, Farxiga  10 mg daily - Hypoglycemia plan: Verbalized - Adherence: Excellent - Eye exam: UTD - Foot exam: UTD - Microalbumin: UTD - Statin: Atorvastatin  40 mg - No symptoms of hypoglycemia, polyuria, polydipsia, numbness of extremities, foot ulcers/trauma  Upper respiratory infection Patient complains of nasal congestion, nonproductive cough, rhinorrhea  , and sore throat.   Symptoms began 3 days ago.   The cough is non-productive, without wheezing, dyspnea or hemoptysis and is aggravated by cold air  Associated symptoms include:postnasal drip. Patient does not have new pets.  Patient does not have a history of asthma.  Patient does not have a history of environmental allergens. Patient has not had recent travel.  Patient does not have a history of smoking.  Patient  does not have previous Chest X-ray.   PERTINENT PMH / PSH: T2DM not on insulin  HTN Hx prostate cancer s/p radiation in early 2000s Parkinson's disease  *Remainder reviewed in problem list.   OBJECTIVE:   BP 128/64   Pulse 86   Ht 6' 3 (1.905 m)   Wt 221 lb 9.6 oz (100.5 kg)   SpO2 99%   BMI 27.70 kg/m   General: Age-appropriate, resting comfortably in chair, NAD, alert and at baseline. HEENT: Mild rhinorrhea, clear oropharynx. Cardiovascular: Regular rate and rhythm. Normal S1/S2. No murmurs, rubs, or gallops appreciated. 2+ radial pulses. Pulmonary: Clear bilaterally to  ascultation. No wheezes, crackles, or rhonchi. Normal WOB on room air. No accessory muscle use. Abdominal: No tenderness to deep or light palpation. No rebound or guarding. No HSM. Skin: Warm and dry. Extremities: No peripheral edema bilaterally. Capillary refill <2 seconds.  No results found for this or any previous visit (from the past 48 hours).     10/06/2023    9:08 AM  Depression screen PHQ 2/9  Decreased Interest 0  Down, Depressed, Hopeless 0  PHQ - 2 Score 0  Altered sleeping 0  Tired, decreased energy 3  Change in appetite 0  Feeling bad or failure about yourself  0  Trouble concentrating 0  Moving slowly or fidgety/restless 0  Suicidal thoughts 0  PHQ-9 Score 3      Data saved with a previous flowsheet row definition     ASSESSMENT/PLAN:   Assessment & Plan Type 2 diabetes mellitus without complication, without long-term current use of insulin  (HCC) Well controlled with A1c at goal <7.5 in October.  Next A1c in 3 months. - Refilled Accu Chek strips and lancets per request - Continue metformin  1000 mg BID, Farxiga  10 mg daily Viral URI 3 days of illness.  Well hydrated on exam.  No fevers or respiratory distress and unremarkable lung exam.  No concern for PNA. - Supportive care per AVS, emphasized hydration - Return precautions per AVS Hypertension associated with diabetes (HCC) Well controlled today.  Dizziness and orthostasis resolved after decreasing BP meds in October. - Continue valsartan -hydrochlorothiazide  80-12.5 mg 1/2 tab daily  Follow up in 3 months.  Denson Niccoli Toma, MD [facility] "

## 2024-08-04 NOTE — Patient Instructions (Signed)
 You have a viral upper respiratory tract infection. The symptoms of a viral infection usually peak on day 4 to 5 of illness and then gradually improve over 10-14 days. It can take 2-3 weeks for cough to completely go away.  Hydration Instructions It is important to stay well hydrated during this illness. Frequent small amounts of fluid will be easier to tolerate then large amounts of fluid at one time.  Things you can do at home to feel better:  - Taking a warm bath, steaming up the bathroom, or using a cool mist humidifier can help with breathing - Vick's Vaporub or equivalent: rub on chest and small amount under nose at night to open nose airways - Fever helps your body fight infection!  You do not have to treat every fever. If you are uncomfortable with fever (temperature 100.4 or higher), you can take Tylenol  up to every 4-6 hours or Ibuprofen  up to every 6-8 hours.  Sore Throat and Cough Treatment  - Honey is good for sore throat - Chamomile tea has antiviral properties - For sore throat you can use throat lozenges, chamomile tea, honey, salt water gargling, warm drinks/broths or popsicles (which ever soothes your child's pain) - [place]'s cough syrup and mucus is safe to use  Except for medications for fever and pain we do NOT recommend over the counter medications (cough suppressants, cough decongestions, cough expectorants).  Studies have shown that these over the counter medications do not work any better than no medications, but may have side effects.   Nasal Congestion Treatment If you have nasal congestion, you can try saline nose drops to thin the mucus, keep mucus loose, and open nasal passagesfollowed by bulb suction to temporarily remove nasal secretions. You can buy saline drops at the grocery store or pharmacy. Some common brand names are L'il Noses, [place], and [place].  They are all equal.  Most come in either spray or dropper form.  You can make saline drops at home by adding 1/2  teaspoon (2 mL) of table salt to 1 cup (8 ounces or 240 ml) of warm water.    Please return if you have: - Fever (temperature 100.4 or higher) for 3 days in a row - Difficulty breathing (fast breathing or breathing deep and hard) - Difficulty swallowing - Poor urination (peeing less than 3 times in a day) - Persistent vomiting - Blood in vomit or stool - Blistering rash

## 2024-08-04 NOTE — Assessment & Plan Note (Signed)
 Well controlled with A1c at goal <7.5 in October.  Next A1c in 3 months. - Refilled Accu Chek strips and lancets per request - Continue metformin  1000 mg BID, Farxiga  10 mg daily

## 2024-08-09 ENCOUNTER — Telehealth: Payer: Self-pay

## 2024-08-09 DIAGNOSIS — E119 Type 2 diabetes mellitus without complications: Secondary | ICD-10-CM

## 2024-08-09 MED ORDER — ACCU-CHEK SOFTCLIX LANCETS MISC: 0 refills | Status: DC

## 2024-08-09 NOTE — Telephone Encounter (Signed)
 [facility] faxed stating that insurance requires specific sig(instructions) on medication Accu-Chek Softclix Lancets.  Placed faxed in PCP's box.  Harlene Reiter, CMA

## 2024-08-09 NOTE — Addendum Note (Signed)
 Addended byBETHA TOMA MATAS on: 08/09/2024 12:27 PM   Modules accepted: Orders

## 2024-08-11 ENCOUNTER — Telehealth: Payer: Self-pay

## 2024-08-11 DIAGNOSIS — E119 Type 2 diabetes mellitus without complications: Secondary | ICD-10-CM

## 2024-08-11 MED ORDER — GLUCOSE BLOOD VI STRP: ORAL_STRIP | 1 refills | Status: AC

## 2024-08-11 MED ORDER — ACCU-CHEK SOFTCLIX LANCETS MISC: 0 refills | Status: AC

## 2024-08-11 NOTE — Telephone Encounter (Signed)
 Patient presents to [facility] reporting difficulties picking up lancets and test strips.  I called the pharmacy.   For both prescriptions, the sig needs to notate number of times testing per day and dx code for insurance purposes.   I have updated and resent both prescriptions.

## 2024-08-16 ENCOUNTER — Encounter

## 2024-08-30 ENCOUNTER — Encounter: Admitting: Gastroenterology

## 2024-09-29 ENCOUNTER — Ambulatory Visit: Admitting: Family Medicine

## 2024-10-05 ENCOUNTER — Encounter

## 2024-11-28 ENCOUNTER — Ambulatory Visit: Admitting: Rheumatology
